# Patient Record
Sex: Female | Born: 1950 | Race: White
Health system: Southern US, Community
[De-identification: ages and names within clinical notes are randomized; demographics above are authoritative.]

## PROBLEM LIST (undated history)

## (undated) DIAGNOSIS — H409 Unspecified glaucoma: Secondary | ICD-10-CM

## (undated) DIAGNOSIS — I700 Atherosclerosis of aorta: Secondary | ICD-10-CM

## (undated) DIAGNOSIS — I119 Hypertensive heart disease without heart failure: Secondary | ICD-10-CM

## (undated) DIAGNOSIS — E669 Obesity, unspecified: Secondary | ICD-10-CM

## (undated) DIAGNOSIS — F32A Depression, unspecified: Secondary | ICD-10-CM

## (undated) DIAGNOSIS — G4730 Sleep apnea, unspecified: Secondary | ICD-10-CM

## (undated) DIAGNOSIS — I4891 Unspecified atrial fibrillation: Secondary | ICD-10-CM

## (undated) DIAGNOSIS — K5190 Ulcerative colitis, unspecified, without complications: Secondary | ICD-10-CM

## (undated) DIAGNOSIS — E039 Hypothyroidism, unspecified: Secondary | ICD-10-CM

## (undated) DIAGNOSIS — F329 Major depressive disorder, single episode, unspecified: Secondary | ICD-10-CM

## (undated) DIAGNOSIS — K219 Gastro-esophageal reflux disease without esophagitis: Secondary | ICD-10-CM

## (undated) DIAGNOSIS — I2699 Other pulmonary embolism without acute cor pulmonale: Secondary | ICD-10-CM

## (undated) DIAGNOSIS — E785 Hyperlipidemia, unspecified: Secondary | ICD-10-CM

## (undated) DIAGNOSIS — M3320 Polymyositis, organ involvement unspecified: Secondary | ICD-10-CM

## (undated) DIAGNOSIS — R011 Cardiac murmur, unspecified: Secondary | ICD-10-CM

## (undated) DIAGNOSIS — J45909 Unspecified asthma, uncomplicated: Secondary | ICD-10-CM

## (undated) DIAGNOSIS — J189 Pneumonia, unspecified organism: Secondary | ICD-10-CM

## (undated) DIAGNOSIS — I341 Nonrheumatic mitral (valve) prolapse: Secondary | ICD-10-CM

## (undated) DIAGNOSIS — K859 Acute pancreatitis without necrosis or infection, unspecified: Secondary | ICD-10-CM

## (undated) DIAGNOSIS — D649 Anemia, unspecified: Secondary | ICD-10-CM

## (undated) DIAGNOSIS — R002 Palpitations: Secondary | ICD-10-CM

## (undated) DIAGNOSIS — M47812 Spondylosis without myelopathy or radiculopathy, cervical region: Secondary | ICD-10-CM

## (undated) DIAGNOSIS — F419 Anxiety disorder, unspecified: Secondary | ICD-10-CM

## (undated) DIAGNOSIS — M353 Polymyalgia rheumatica: Secondary | ICD-10-CM

## (undated) DIAGNOSIS — M25519 Pain in unspecified shoulder: Secondary | ICD-10-CM

## (undated) DIAGNOSIS — R51 Headache: Secondary | ICD-10-CM

## (undated) DIAGNOSIS — D509 Iron deficiency anemia, unspecified: Secondary | ICD-10-CM

## (undated) DIAGNOSIS — Z9289 Personal history of other medical treatment: Secondary | ICD-10-CM

## (undated) DIAGNOSIS — I517 Cardiomegaly: Secondary | ICD-10-CM

## (undated) DIAGNOSIS — I340 Nonrheumatic mitral (valve) insufficiency: Secondary | ICD-10-CM

## (undated) DIAGNOSIS — M810 Age-related osteoporosis without current pathological fracture: Secondary | ICD-10-CM

## (undated) DIAGNOSIS — I499 Cardiac arrhythmia, unspecified: Secondary | ICD-10-CM

## (undated) DIAGNOSIS — R262 Difficulty in walking, not elsewhere classified: Secondary | ICD-10-CM

## (undated) DIAGNOSIS — S2232XA Fracture of one rib, left side, initial encounter for closed fracture: Secondary | ICD-10-CM

## (undated) HISTORY — DX: Difficulty in walking, not elsewhere classified: R26.2

## (undated) HISTORY — DX: Nonrheumatic mitral (valve) prolapse: I34.1

## (undated) HISTORY — DX: Gastro-esophageal reflux disease without esophagitis: K21.9

## (undated) HISTORY — DX: Cardiac arrhythmia, unspecified: I49.9

## (undated) HISTORY — PX: BREAST SURGERY: SHX581

## (undated) HISTORY — PX: FRACTURE SURGERY: SHX138

## (undated) HISTORY — PX: ROTATOR CUFF REPAIR: SHX139

## (undated) HISTORY — DX: Headache: R51

## (undated) HISTORY — DX: Personal history of other medical treatment: Z92.89

## (undated) HISTORY — DX: Cardiomegaly: I51.7

## (undated) HISTORY — DX: Sleep apnea, unspecified: G47.30

## (undated) HISTORY — DX: Nonrheumatic mitral (valve) insufficiency: I34.0

## (undated) HISTORY — PX: EGD, DILATION: SHX3804

## (undated) HISTORY — DX: Cardiac murmur, unspecified: R01.1

## (undated) HISTORY — PX: PANCREAS SURGERY: SHX731

## (undated) HISTORY — DX: Anemia, unspecified: D64.9

## (undated) HISTORY — DX: Unspecified asthma, uncomplicated: J45.909

## (undated) HISTORY — PX: ESOPHAGOGASTRODUODENOSCOPY: SHX1529

## (undated) HISTORY — DX: Hypothyroidism, unspecified: E03.9

## (undated) HISTORY — DX: Fracture of one rib, left side, initial encounter for closed fracture: S22.32XA

## (undated) HISTORY — DX: Polymyalgia rheumatica: M35.3

## (undated) HISTORY — DX: Ulcerative colitis, unspecified, without complications: K51.90

## (undated) HISTORY — PX: REDUCTION MAMMAPLASTY: SUR839

## (undated) HISTORY — PX: TOTAL SHOULDER REPLACEMENT: SUR1217

## (undated) HISTORY — PX: COLONOSCOPY WITH ESOPHAGOGASTRODUODENOSCOPY (EGD): SHX5779

## (undated) HISTORY — PX: ABDOMINAL SURGERY: SHX537

## (undated) HISTORY — PX: BREAST LUMPECTOMY: SHX2

## (undated) HISTORY — PX: BREAST REDUCTION SURGERY: SHX8

---
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
## 1970-10-14 HISTORY — PX: SHOULDER MUSCLE TRANSFER: SHX2406

## 1979-10-15 DIAGNOSIS — I2699 Other pulmonary embolism without acute cor pulmonale: Secondary | ICD-10-CM

## 1979-10-15 HISTORY — PX: ANKLE FUSION: SHX881

## 1979-10-15 HISTORY — PX: KNEE SURGERY: SHX244

## 1979-10-15 HISTORY — DX: Other pulmonary embolism without acute cor pulmonale: I26.99

## 1979-10-15 HISTORY — PX: KNEE ARTHROSCOPY: SUR90

## 1981-10-14 HISTORY — PX: COSMETIC SURGERY: SHX468

## 1988-10-14 DIAGNOSIS — K5190 Ulcerative colitis, unspecified, without complications: Secondary | ICD-10-CM

## 1988-10-14 HISTORY — DX: Ulcerative colitis, unspecified, without complications: K51.90

## 1996-10-14 HISTORY — PX: LUNG BIOPSY: SHX232

## 1997-10-08 ENCOUNTER — Emergency Department
Admit: 1997-10-08 | Disposition: A | Discharge status: Home / Self Care | Payer: Self-pay | Admitting: Emergency Medicine

## 1997-10-16 ENCOUNTER — Inpatient Hospital Stay: Admit: 1997-10-16 | Disposition: A | Discharge status: Home / Self Care | Payer: Self-pay | Admitting: Internal Medicine

## 1997-11-11 ENCOUNTER — Ambulatory Visit: Admit: 1997-11-11 | Disposition: A | Discharge status: Home / Self Care | Payer: Self-pay | Admitting: Pulmonary Disease

## 1997-11-14 ENCOUNTER — Ambulatory Visit: Admit: 1997-11-14 | Disposition: A | Discharge status: Home / Self Care | Payer: Self-pay | Admitting: Pulmonary Disease

## 1997-11-16 ENCOUNTER — Other Ambulatory Visit: Payer: Self-pay

## 1997-11-16 ENCOUNTER — Ambulatory Visit: Admit: 1997-11-16 | Disposition: A | Discharge status: Home / Self Care | Payer: Self-pay | Admitting: Pulmonary Disease

## 1997-11-22 ENCOUNTER — Ambulatory Visit: Admit: 1997-11-22 | Disposition: A | Discharge status: Home / Self Care | Payer: Self-pay | Admitting: Pulmonary Disease

## 1997-11-25 ENCOUNTER — Ambulatory Visit: Admit: 1997-11-25 | Disposition: A | Discharge status: Home / Self Care | Payer: Self-pay | Admitting: Pulmonary Disease

## 2004-04-09 ENCOUNTER — Emergency Department
Admission: EM | Admit: 2004-04-09 | Disposition: A | Discharge status: Home / Self Care | Payer: Self-pay | Source: Emergency Department | Admitting: Internal Medicine

## 2004-10-14 HISTORY — PX: GASTRIC BYPASS: SHX52

## 2005-06-24 ENCOUNTER — Ambulatory Visit
Admit: 2005-06-24 | Disposition: A | Discharge status: Home / Self Care | Payer: Self-pay | Source: Ambulatory Visit | Admitting: Surgery

## 2005-07-01 ENCOUNTER — Ambulatory Visit: Admission: RE | Admit: 2005-07-01 | Payer: Self-pay | Source: Ambulatory Visit | Admitting: Gastroenterology

## 2005-07-02 ENCOUNTER — Ambulatory Visit: Admission: RE | Admit: 2005-07-02 | Payer: Self-pay | Source: Ambulatory Visit | Admitting: Gastroenterology

## 2005-07-09 ENCOUNTER — Ambulatory Visit: Admit: 2005-07-09 | Disposition: A | Discharge status: Home / Self Care | Payer: Self-pay | Source: Ambulatory Visit

## 2005-07-19 ENCOUNTER — Ambulatory Visit: Admission: RE | Admit: 2005-07-19 | Payer: Self-pay | Source: Ambulatory Visit | Admitting: Specialist

## 2005-07-23 ENCOUNTER — Inpatient Hospital Stay
Admission: RE | Admit: 2005-07-23 | Disposition: A | Discharge status: Home / Self Care | Payer: Self-pay | Source: Ambulatory Visit | Admitting: Surgery

## 2005-08-19 ENCOUNTER — Ambulatory Visit: Admission: RE | Admit: 2005-08-19 | Payer: Self-pay | Source: Ambulatory Visit | Admitting: Specialist

## 2005-08-23 ENCOUNTER — Ambulatory Visit: Admission: RE | Admit: 2005-08-23 | Payer: Self-pay | Source: Ambulatory Visit | Admitting: Gastroenterology

## 2005-09-13 ENCOUNTER — Ambulatory Visit: Admission: RE | Admit: 2005-09-13 | Payer: Self-pay | Source: Ambulatory Visit | Admitting: Gastroenterology

## 2005-09-26 ENCOUNTER — Ambulatory Visit
Admit: 2005-09-26 | Disposition: A | Discharge status: Home / Self Care | Payer: Self-pay | Source: Ambulatory Visit | Admitting: Surgery

## 2005-09-30 ENCOUNTER — Ambulatory Visit
Admit: 2005-09-30 | Disposition: A | Discharge status: Home / Self Care | Payer: Self-pay | Source: Ambulatory Visit | Admitting: Gastroenterology

## 2006-02-23 ENCOUNTER — Emergency Department: Payer: Self-pay | Admitting: Emergency Medicine

## 2006-03-20 ENCOUNTER — Ambulatory Visit
Admit: 2006-03-20 | Disposition: A | Discharge status: Home / Self Care | Payer: Self-pay | Source: Ambulatory Visit | Admitting: Surgery

## 2006-03-20 LAB — CBC- CERNER
Hematocrit: 38.6 % (ref 37.0–47.0)
Hgb: 12.6 G/DL (ref 12.0–16.0)
MCH: 29.1 PG (ref 28.0–32.0)
MCHC: 32.6 G/DL (ref 32.0–36.0)
MCV: 89.3 FL (ref 80.0–100.0)
MPV: 9.6 FL (ref 7.4–10.4)
Platelets: 217 /mm3 (ref 140–400)
RBC: 4.33 /mm3 (ref 4.20–5.40)
RDW: 14 % (ref 11.5–15.0)
WBC: 8.2 /mm3 (ref 3.5–10.8)

## 2006-03-20 LAB — COMPREHENSIVE METABOLIC PANEL
ALT: 30 U/L (ref 7–56)
AST (SGOT): 20 U/L (ref 5–40)
Albumin/Globulin Ratio: 1.2 (ref 1.1–1.8)
Albumin: 3.4 G/DL — ABNORMAL LOW (ref 3.7–5.1)
Alkaline Phosphatase: 86 U/L (ref 43–122)
BUN: 16 MG/DL (ref 7–21)
Bilirubin, Total: 0.4 MG/DL (ref 0.2–1.3)
CO2: 27 MEQ/L (ref 22–31)
Calcium: 8.9 MG/DL (ref 8.6–10.2)
Chloride: 106 MEQ/L (ref 98–107)
Creatinine: 0.8 MG/DL (ref 0.5–1.4)
Globulin: 2.8 G/DL (ref 2.0–3.7)
Glucose: 97 MG/DL (ref 65–110)
Potassium: 3.6 MEQ/L (ref 3.6–5.0)
Protein, Total: 6.2 G/DL (ref 6.0–8.0)
Sodium: 141 MEQ/L (ref 136–143)

## 2006-03-20 LAB — GFR

## 2006-03-20 LAB — VITAMIN B12: Vitamin B-12: 774 pg/mL (ref 225.0–950.0)

## 2006-03-20 LAB — LIPID PANEL
Cholesterol: 191 MG/DL (ref ?–200)
HDL: 44 mg/dL (ref 40–60)
LDL Calculated: 127 mg/dL — ABNORMAL HIGH (ref ?–100)
Triglycerides: 101 MG/DL (ref ?–150)
VLDL Calculated: 20 mg/dL (ref ?–30)

## 2006-03-20 LAB — FERRITIN: Ferritin: 99.7 ng/mL (ref 11.0–264.0)

## 2006-03-20 LAB — IRON: Iron: 42 UG/DL (ref 37–171)

## 2006-03-20 LAB — PTH, INTACT: PTH Intact: 56.2 pg/ml (ref 15.0–65.0)

## 2006-03-20 LAB — FOLATE: Folate: >20 ng/mL (ref 2.8–20.0)

## 2006-03-21 LAB — GLYCOHEMOGLOBIN A1C* CERNER: % Hgb A1C: 5.8 % (ref 4.8–6.0)

## 2006-03-22 LAB — 25-HYDROXYVITAMIN D2 AND D3, S-SOFT

## 2006-03-24 LAB — TRANSFERRIN: Transferrin: 206 mg/dL (ref 150–330)

## 2006-09-20 ENCOUNTER — Emergency Department
Admission: EM | Admit: 2006-09-20 | Disposition: A | Discharge status: Home / Self Care | Payer: Self-pay | Source: Emergency Department | Admitting: Emergency Medicine

## 2006-09-21 LAB — ^CBC WITH DIFF MCKESSON
BASOPHILS %: 0.4 % (ref 0–2)
Baso(Absolute): 0.1
Eosinophils %: 0.5 % (ref 0–6)
Eosinophils Absolute: 0.1
Hematocrit: 42 % (ref 27.0–49.5)
Hemoglobin: 13.8 g/dL (ref 11.7–15.5)
Lymphocytes Absolute: 2.5
Lymphocytes Relative: 18.6 % — ABNORMAL LOW (ref 25–55)
MCH: 30.2 pg (ref 27.0–34.0)
MCHC: 32.9 % (ref 32.0–36.0)
MCV: 91.8 fL (ref 80–100)
Monocytes Absolute: 0.6
Monocytes Relative %: 4.2 % (ref 1–8)
Neutrophils Absolute: 10.1
Neutrophils Relative %: 76.3 % — ABNORMAL HIGH (ref 49–69)
Platelets: 335 10*3/uL (ref 150–400)
RBC: 4.58 /mm3 (ref 3.80–5.40)
RDW: 14.3 % — ABNORMAL HIGH (ref 11.0–14.0)
WBC: 13.3 10*3/uL — ABNORMAL HIGH (ref 4.8–10.8)

## 2006-09-21 LAB — COMPREHENSIVE METABOLIC PANEL
ALT: 26 U/L (ref 7–56)
AST (SGOT): 22 U/L (ref 5–40)
Albumin, Synovial: 4.4 g/dL (ref 3.9–5.0)
Alkaline Phosphatase: 109 U/L (ref 38–126)
BUN / Creatinine Ratio: 19 (ref 8–20)
BUN: 19 mg/dL (ref 6–20)
Bilirubin, Total: 0.4 mg/dL (ref 0.2–1.3)
CO2: 26 mmol/L (ref 21.0–31.0)
Calcium: 9.3 mg/dL (ref 8.4–10.2)
Chloride: 104 mmol/L (ref 101–111)
Creatinine: 0.96 mg/dL (ref 0.5–1.4)
EGFR: >60 mL/min/{1.73_m2}
EGFR: >60 mL/min/{1.73_m2}
Glucose: 115 mg/dL — ABNORMAL HIGH (ref 70–100)
Potassium: 4.3 mmol/L (ref 3.6–5.0)
Protein, Total: 7.8 g/dL (ref 6.3–8.2)
Sodium: 141 mmol/L (ref 135–145)

## 2006-09-21 LAB — AMYLASE: Amylase: <30 U/L (ref 29–110)

## 2006-09-21 LAB — LIPASE: Lipase: 28 U/L (ref 23–300)

## 2006-10-14 DIAGNOSIS — I4891 Unspecified atrial fibrillation: Secondary | ICD-10-CM

## 2006-10-14 DIAGNOSIS — I480 Paroxysmal atrial fibrillation: Secondary | ICD-10-CM

## 2006-10-14 HISTORY — DX: Unspecified atrial fibrillation: I48.91

## 2006-10-14 HISTORY — DX: Paroxysmal atrial fibrillation: I48.0

## 2007-01-30 ENCOUNTER — Ambulatory Visit
Admit: 2007-01-30 | Disposition: A | Discharge status: Home / Self Care | Payer: Self-pay | Source: Ambulatory Visit | Admitting: Gastroenterology

## 2007-04-28 ENCOUNTER — Ambulatory Visit
Admission: RE | Admit: 2007-04-28 | Disposition: A | Discharge status: Home / Self Care | Payer: Self-pay | Source: Ambulatory Visit | Admitting: Cardiovascular Disease

## 2007-10-15 HISTORY — PX: CARDIAC CATHETERIZATION: SHX172

## 2008-04-21 ENCOUNTER — Ambulatory Visit
Admission: RE | Admit: 2008-04-21 | Disposition: A | Discharge status: Home / Self Care | Payer: Self-pay | Source: Ambulatory Visit | Admitting: Surgery

## 2008-04-29 ENCOUNTER — Ambulatory Visit
Admission: RE | Admit: 2008-04-29 | Disposition: A | Discharge status: Home / Self Care | Payer: Self-pay | Source: Ambulatory Visit | Admitting: Surgery

## 2008-05-27 ENCOUNTER — Ambulatory Visit
Admission: RE | Admit: 2008-05-27 | Disposition: A | Discharge status: Home / Self Care | Payer: Self-pay | Source: Ambulatory Visit | Admitting: Surgery

## 2008-10-18 ENCOUNTER — Ambulatory Visit
Admit: 2008-10-18 | Disposition: A | Discharge status: Home / Self Care | Payer: Self-pay | Source: Ambulatory Visit | Admitting: Gastroenterology

## 2008-10-18 LAB — HEPATIC FUNCTION PANEL
ALT: 19 U/L (ref 7–56)
AST (SGOT): 20 U/L (ref 5–40)
Albumin/Globulin Ratio: 1.4 (ref 1.1–1.8)
Albumin: 3.9 G/DL (ref 3.7–5.1)
Alkaline Phosphatase: 120 U/L (ref 43–122)
Bilirubin Direct: 0 MG/DL — AB (ref 0.0–0.3)
Bilirubin Indirect: 0.2 MG/DL (ref 0.0–1.1)
Bilirubin, Total: 0.2 MG/DL (ref 0.2–1.3)
Globulin: 2.8 G/DL (ref 2.0–3.7)
Protein, Total: 6.7 G/DL (ref 6.0–8.0)

## 2008-10-18 LAB — LIPASE: Lipase: 95 U/L (ref 23–300)

## 2008-10-18 LAB — AMYLASE: Amylase: 30 U/L — AB (ref 30–110)

## 2008-10-18 LAB — SEDIMENTATION RATE: Sed Rate: 27 MM/HR — ABNORMAL HIGH (ref 0–23)

## 2008-11-11 ENCOUNTER — Ambulatory Visit: Admission: RE | Admit: 2008-11-11 | Payer: Self-pay | Source: Ambulatory Visit | Admitting: Gastroenterology

## 2008-11-18 ENCOUNTER — Ambulatory Visit: Admission: RE | Admit: 2008-11-18 | Payer: Self-pay | Source: Ambulatory Visit | Admitting: Gastroenterology

## 2009-05-17 ENCOUNTER — Ambulatory Visit
Admission: RE | Admit: 2009-05-17 | Disposition: A | Discharge status: Home / Self Care | Payer: Self-pay | Source: Ambulatory Visit | Admitting: Surgery

## 2009-10-14 HISTORY — PX: D&C DIAGNOSTIC: SHX510210

## 2009-10-14 HISTORY — PX: DILATION AND CURETTAGE OF UTERUS: SHX78

## 2009-10-14 HISTORY — PX: DILATION AND CURETTAGE, DIAGNOSTIC / THERAPEUTIC: SUR384

## 2010-08-14 HISTORY — PX: CYSTOSTOMY W/ BLADDER BIOPSY: SHX1431

## 2010-10-14 DIAGNOSIS — K859 Acute pancreatitis without necrosis or infection, unspecified: Secondary | ICD-10-CM

## 2010-10-14 DIAGNOSIS — H409 Unspecified glaucoma: Secondary | ICD-10-CM

## 2010-10-14 DIAGNOSIS — Z5189 Encounter for other specified aftercare: Secondary | ICD-10-CM

## 2010-10-14 HISTORY — DX: Acute pancreatitis without necrosis or infection, unspecified: K85.90

## 2010-10-14 HISTORY — DX: Encounter for other specified aftercare: Z51.89

## 2010-10-14 HISTORY — PX: JOINT REPLACEMENT: SHX530

## 2010-10-14 HISTORY — DX: Unspecified glaucoma: H40.9

## 2010-10-14 HISTORY — PX: TOTAL KNEE ARTHROPLASTY: SHX125

## 2011-02-22 ENCOUNTER — Emergency Department: Payer: Self-pay | Admitting: Emergency Medicine

## 2011-08-01 NOTE — Op Note (Unsigned)
DATE OF BIRTH:                        04-22-1951      ADMISSION DATE:                     04/28/2007            PATIENT LOCATION:                     [facility]            DATE OF PROCEDURE:                   04/28/2007      SURGEON:                            Mardene Celeste, MD      ASSISTANT(S):                  PREOPERATIVE DIAGNOSIS:            POSTOPERATIVE DIAGNOSIS:            PROCEDURE:            INDICATIONS FOR PROCEDURE:   The patient is a pleasant 60 year old female      who has experienced, since bariatric surgery one and a half years ago, a      progressive decrement in LV systolic function.  A TEE performed several      weeks ago revealed an ejection fraction of approximately 35% with mild to      moderate mitral regurgitation and the presence of a small PFO with right to      left shunting.  To exclude coronary disease and reassess the severity of      the mitral regurgitation, a cardiac catheterization was recommended.            DESCRIPTION OF PROCEDURE:  The patient was premedicated and brought to the      cardiac catheterization laboratory.  She did have an iodine allergy and      was, therefore, truly premedicated with steroids and antihistamines.   The      right inguinal area was prepped and draped.  Utilizing aseptic technique      and a single needle puncture approach, a 6-French sheath was placed into      the right femoral artery.  A Judkins 4L 6-French left coronary arterial      catheter was introduced over a guide wire and passed proximally to engage      the left main for definitive arteriography of this system.  This catheter      was removed, and 6-French right Williams catheter was then introduced in a      similar fashion, which was utilized to cannulate the right main, with      definitive arteriography of this system performed.  This catheter was then      removed, and a pigtail catheter was passed into the left ventricle for      determination of the left ventricular end  diastolic pressure.  RAO      ventriculography was then performed.  The catheter withdrawn across the      aortic valve to exclude the possibility of a transvalvular gradient and      removed from the patient.  Hemostasis was obtained with the Angio-Seal  device.  Optiray was used throughout.   There were no complications.            HEMODYNAMICS:      1.   The left ventricular end diastolic pressure was normal at rest.      2.   There was no transaortic gradient.            ARTERIOGRAPHY:  The right coronary artery was non dominant.  It was a      small, non dominant vessel coursing within the AV groove, free os      significant obstructive disease.            Left main.  This vessel was of normal length and dimensions, and free of      significant obstructive disease.   It gives rise to a dominant circumflex      and left anterior descending.  The circumflex consists of a large AV groove      branch and first branching obtuse marginal, as well as a distal posterior      descending branch and posteroventricular arcade.  This system is free of      significant obstructive disease.            Left anterior descending. This is a large vessel coursing down the      intraventricular groove toward the apex.  It and its ramifications,      including the large first diagonal, are free of significant obstructive      disease.            RAO ventriculography revealed a left ventricle of normal volumes, with      evidence of diffuse mild hypokinesis.  There was no evidence of mitral      prolapse nor of mitral regurgitation seen.  The ejection fraction is      estimated at 40%.            FINAL IMPRESSION:      1.   Normal coronary arteries.      2.   Global diminishment of left ventricular systolic function, ejection      fraction of 40%.      3.   Normal left ventricular diastolic function.      4.   No evidence of mitral regurgitation seen.                                          ___________________________________           Date Signed: __________      Mardene Celeste, MD  ([id number])            D: 04/28/2007 by Mardene Celeste, MD      T: 04/28/2007 by [id number] ([id number]) ([id number])      cc:  Mardene Celeste, MD

## 2011-10-01 NOTE — Op Note (Signed)
MRN: [id number] DOCUMENT ID: [id number]      INTRODUCTION:      60 YEAR OLD FEMALE PATIENT PRESENTS FOR AN ELECTIVE OUTPATIENT EGD.  THE      INDICATION FOR THE PROCEDURE WAS GERD SYMPTOMS.      CONSENT:      THE BENEFITS, RISKS, AND ALTERNATIVES TO THE PROCEDURE WERE DISCUSSED AND      INFORMED CONSENT WAS OBTAINED.      PREPARATION:      EKG, PULSE, PULSE OXIMETRY AND BLOOD PRESSURE MONITORED.      MEDICATIONS:      - MAC ANESTHESIA WAS ADMINISTERED DURING THE PROCEDURE      PROCEDURE:      THE ENDOSCOPE WAS PASSED.  THE STUDY WAS PERFORMED WITH A GIF-H180 AND      GIF-H180.      FINDINGS:  THE SCOPE WAS ADVANCED TO THE GASTRIC POUCH.  THE ANASTOMOSIS      WAS PATENT.  NO ULCERS NOTED.  THE SMALL BOWEL LIMBS WERE PATENT.  THE      SCOPE WAS WITHDRAWN BACK.  IN THE DISTAL ESOPHAGUS, ISLANDS OF SALMON      COLORED MUCOSA WERE BIOPSIED FOR BARRETT'S ESOPHAGUS.      IMPRESSION:      1.  MILD GASTRITIS-BIOPSIED FOR H. PYLORI      2.  RULE OUT BARRETT'S ESOPHAGUS      3.  PATENT ANASTOMOSIS      RECOMMENDATION:      -WILL CHECK BIOPSY RESULTS      -CONTINUE CURRENT MEDICATIONS      -WILL SEND LETTER FOR RESULTS IN 3-4 WEEKS      SIGNING PHYSICIAN: Nechelle Petrizzo

## 2011-10-01 NOTE — Op Note (Signed)
MRN: [id number] DOCUMENT ID: [id number]      INTRODUCTION:      60 YEAR OLD FEMALE PATIENT PRESENTS FOR AN ELECTIVE OUTPATIENT      COLONOSCOPY.  THE INDICATION FOR THE PROCEDURE WAS HISTORY OF ULCERATIVE      COLITIS AND ABDOMINAL PAIN.      CONSENT:      THE BENEFITS, RISKS, AND ALTERNATIVES TO THE PROCEDURE WERE DISCUSSED AND      INFORMED CONSENT WAS OBTAINED.      PREPARATION:      EKG, PULSE, PULSE OXIMETRY AND BLOOD PRESSURE MONITORED.      MEDICATIONS:      - MAC ANESTHESIA WAS ADMINISTERED DURING THE PROCEDURE      PROCEDURE:      RECTAL EXAM: NORMAL.      THE ENDOSCOPE WAS PASSED.  THE QUALITY OF THE PREPARATION WAS GOOD.  THE      STUDY WAS PERFORMED WITH A CF-H180.      FINDINGS:  THE SCOPE WAS ADVANCED TO THE CECUM.  THE IC VALVE AND      APPENDICEAL ORIFICE WERE NOTED.  THE SCOPE WAS ADVANCED INTO THE TERMINAL      ILEUM WHICH WAS NORMAL.  THE SCOPE WAS WITHDRAWN BACK.  THE SCOPE WAS      WITHDRAWN BACK.  THERE WAS MILD ERYTHEMA OF THE ASCENDING, TRANSVERSE, AND      DESCENDING COLON.  EACH SEGMENT WAS BIOPSIED FOR COLITIS AND DYSPLASIA.      THE SIGMOID AND RECTUM APPEARED UNREMARKABLE.  SIGMOID DIVERTICULOSIS WERE      NOTED.  BIOPSIES WERE PERFORMED FOR COLITIS AND DYSPLASIA.  RETROFLEXION      SHOWED INTERNAL HEMORRHOIDS.      IMPRESSION:      1.  QUIESCENT COLITIS.  EACH SEGMENT WAS BIOPSIED FOR COLITIS AND      DYSPLASIA.      2.  SIGMOID DIVERTICULOSIS      3.  INTERNAL HEMORRHOIDS      4.  NORMAL TERMINAL ILEUM.      RECOMMENDATION:      -WILL CHECK BIOPSY RESULTS      -CONTINUE CURRENT MEDICATIONS      -WILL SEND LETTER FOR RESULTS IN 3-4 WEEKS      SIGNING PHYSICIAN: Hicks Feick

## 2011-10-15 HISTORY — PX: CHOLECYSTECTOMY: SHX55

## 2012-02-14 ENCOUNTER — Ambulatory Visit: Payer: Medicare Other | Attending: Surgery

## 2012-02-14 NOTE — Pre-Procedure Instructions (Signed)
Hx of anemia requiring transfusions. No pre-ops ordered from MD.  [facility] faxed for recent labwork.  Orders faxed to pharmacy.

## 2012-02-17 NOTE — Anesthesia Preprocedure Evaluation (Addendum)
Anesthesia Evaluation    AIRWAY    Mallampati: I    TM distance: >3 FB  Neck ROM: full  Mouth Opening:full   CARDIOVASCULAR    regular and normal     DENTAL         PULMONARY    clear to auscultation     OTHER FINDINGS    Upper front caps; upper/lower crowns          Anesthesia Plan    ASA 3   general   Detailed anesthesia plan: general endotracheal    Post Op: other  Post op pain management: per surgeon  intravenous induction       Plan discussed with CRNA.

## 2012-02-17 NOTE — Pre-Procedure Instructions (Signed)
Previous ekg's /TSH, Vit D  12/2011 sent for scan

## 2012-02-18 ENCOUNTER — Ambulatory Visit: Payer: Self-pay

## 2012-02-18 ENCOUNTER — Inpatient Hospital Stay: Payer: Medicare Other

## 2012-02-18 ENCOUNTER — Ambulatory Visit
Admission: RE | Admit: 2012-02-18 | Discharge: 2012-02-18 | Disposition: A | Discharge status: Home / Self Care | Payer: Medicare Other | Source: Ambulatory Visit | Attending: Surgery | Admitting: Surgery

## 2012-02-18 ENCOUNTER — Encounter: Payer: Self-pay | Admitting: Pain Medicine

## 2012-02-18 ENCOUNTER — Inpatient Hospital Stay: Payer: Medicare Other | Admitting: Surgery

## 2012-02-18 ENCOUNTER — Encounter
Admission: RE | Disposition: A | Discharge status: Home / Self Care | Payer: Self-pay | Source: Ambulatory Visit | Attending: Surgery

## 2012-02-18 ENCOUNTER — Inpatient Hospital Stay: Payer: Medicare Other | Admitting: Pain Medicine

## 2012-02-18 DIAGNOSIS — I059 Rheumatic mitral valve disease, unspecified: Secondary | ICD-10-CM | POA: Insufficient documentation

## 2012-02-18 DIAGNOSIS — K859 Acute pancreatitis without necrosis or infection, unspecified: Secondary | ICD-10-CM | POA: Insufficient documentation

## 2012-02-18 DIAGNOSIS — K458 Other specified abdominal hernia without obstruction or gangrene: Secondary | ICD-10-CM | POA: Insufficient documentation

## 2012-02-18 DIAGNOSIS — K219 Gastro-esophageal reflux disease without esophagitis: Secondary | ICD-10-CM | POA: Insufficient documentation

## 2012-02-18 DIAGNOSIS — Z86718 Personal history of other venous thrombosis and embolism: Secondary | ICD-10-CM | POA: Insufficient documentation

## 2012-02-18 DIAGNOSIS — E039 Hypothyroidism, unspecified: Secondary | ICD-10-CM | POA: Insufficient documentation

## 2012-02-18 DIAGNOSIS — Z9884 Bariatric surgery status: Secondary | ICD-10-CM | POA: Insufficient documentation

## 2012-02-18 DIAGNOSIS — R1011 Right upper quadrant pain: Secondary | ICD-10-CM | POA: Insufficient documentation

## 2012-02-18 HISTORY — DX: Other pulmonary embolism without acute cor pulmonale: I26.99

## 2012-02-18 LAB — URINE HCG QUALITATIVE: Urine HCG Qualitative: NEGATIVE

## 2012-02-18 SURGERY — LAPAROSCOPIC, CHOLECYSTECTOMY, CHOLANGIOGRAM
Anesthesia: Anesthesia General | Site: Pelvis | Wound class: Clean Contaminated

## 2012-02-18 MED ORDER — OXYCODONE-ACETAMINOPHEN 5-325 MG PO TABS
1.00 | ORAL_TABLET | Freq: Four times a day (QID) | ORAL | Status: AC | PRN
Start: 2012-02-18 — End: 2012-02-28

## 2012-02-18 MED ORDER — PROPOFOL 10 MG/ML IV EMUL
INTRAVENOUS | Status: DC | PRN
Start: 2012-02-18 — End: 2012-02-18
  Administered 2012-02-18: 150 mg via INTRAVENOUS

## 2012-02-18 MED ORDER — FENTANYL CITRATE 0.05 MG/ML IJ SOLN
50.0000 ug | INTRAMUSCULAR | Status: DC | PRN
Start: 2012-02-18 — End: 2012-02-18

## 2012-02-18 MED ORDER — OXYCODONE-ACETAMINOPHEN 5-325 MG PO TABS
ORAL_TABLET | ORAL | Status: AC
Start: 2012-02-18 — End: ?
  Filled 2012-02-18: qty 1

## 2012-02-18 MED ORDER — LACTATED RINGERS IV SOLN
INTRAVENOUS | Status: DC
Start: 2012-02-18 — End: 2012-02-18
  Administered 2012-02-18: 1000 mL via INTRAVENOUS

## 2012-02-18 MED ORDER — OXYCODONE-ACETAMINOPHEN 5-325 MG PO TABS
1.0000 | ORAL_TABLET | Freq: Once | ORAL | Status: AC | PRN
Start: 2012-02-18 — End: 2012-02-18
  Administered 2012-02-18: 1 via ORAL

## 2012-02-18 MED ORDER — OXYCODONE-ACETAMINOPHEN 5-325 MG PO TABS
1.0000 | ORAL_TABLET | Freq: Once | ORAL | Status: DC | PRN
Start: 2012-02-18 — End: 2012-02-18

## 2012-02-18 MED ORDER — SODIUM CHLORIDE 0.9 % IR SOLN
Status: DC | PRN
Start: 2012-02-18 — End: 2012-02-18
  Administered 2012-02-18: 100 mL

## 2012-02-18 MED ORDER — IOTHALAMATE MEGLUMINE 60 % IJ SOLN
INTRAMUSCULAR | Status: DC | PRN
Start: 2012-02-18 — End: 2012-02-18
  Administered 2012-02-18: 28 mL

## 2012-02-18 MED ORDER — HYDROMORPHONE HCL PF 1 MG/ML IJ SOLN
0.5000 mg | INTRAMUSCULAR | Status: DC | PRN
Start: 2012-02-18 — End: 2012-02-18

## 2012-02-18 MED ORDER — KETOROLAC TROMETHAMINE 30 MG/ML IJ SOLN
INTRAMUSCULAR | Status: AC
Start: 2012-02-18 — End: ?
  Filled 2012-02-18: qty 1

## 2012-02-18 MED ORDER — LIDOCAINE HCL 2 % IJ SOLN
INTRAMUSCULAR | Status: AC
Start: 2012-02-18 — End: ?
  Filled 2012-02-18: qty 1

## 2012-02-18 MED ORDER — MEPERIDINE HCL 25 MG/ML IJ SOLN
25.0000 mg | Freq: Once | INTRAMUSCULAR | Status: DC | PRN
Start: 2012-02-18 — End: 2012-02-18

## 2012-02-18 MED ORDER — NEOSTIGMINE METHYLSULFATE 1 MG/ML IJ SOLN
INTRAMUSCULAR | Status: DC | PRN
Start: 2012-02-18 — End: 2012-02-18
  Administered 2012-02-18: 3 mg via INTRAVENOUS

## 2012-02-18 MED ORDER — HYDROMORPHONE HCL PF 1 MG/ML IJ SOLN
INTRAMUSCULAR | Status: AC
Start: 2012-02-18 — End: 2012-02-18
  Administered 2012-02-18: 0.5 mg via INTRAVENOUS
  Filled 2012-02-18: qty 1

## 2012-02-18 MED ORDER — BUPIVACAINE-EPINEPHRINE (PF) 0.5% -1:200000 IJ SOLN
INTRAMUSCULAR | Status: AC
Start: 2012-02-18 — End: ?
  Filled 2012-02-18: qty 1

## 2012-02-18 MED ORDER — PROMETHAZINE HCL 25 MG/ML IJ SOLN
6.2500 mg | Freq: Once | INTRAMUSCULAR | Status: DC | PRN
Start: 2012-02-18 — End: 2012-02-18

## 2012-02-18 MED ORDER — ROCURONIUM BROMIDE 50 MG/5ML IV SOLN
INTRAVENOUS | Status: AC
Start: 2012-02-18 — End: ?
  Filled 2012-02-18: qty 1

## 2012-02-18 MED ORDER — ONDANSETRON HCL 4 MG/2ML IJ SOLN
4.0000 mg | Freq: Once | INTRAMUSCULAR | Status: DC | PRN
Start: 2012-02-18 — End: 2012-02-18

## 2012-02-18 MED ORDER — ONDANSETRON HCL 4 MG/2ML IJ SOLN
INTRAMUSCULAR | Status: DC
Start: 2012-02-18 — End: 2012-02-18
  Filled 2012-02-18: qty 2

## 2012-02-18 MED ORDER — FENTANYL CITRATE 0.05 MG/ML IJ SOLN
INTRAMUSCULAR | Status: DC | PRN
Start: 2012-02-18 — End: 2012-02-18
  Administered 2012-02-18: 100 ug via INTRAVENOUS
  Administered 2012-02-18 (×2): 50 ug via INTRAVENOUS

## 2012-02-18 MED ORDER — SUCCINYLCHOLINE CHLORIDE 20 MG/ML IJ SOLN
INTRAMUSCULAR | Status: DC | PRN
Start: 2012-02-18 — End: 2012-02-18
  Administered 2012-02-18: 100 mg via INTRAVENOUS

## 2012-02-18 MED ORDER — GLYCOPYRROLATE 0.2 MG/ML IJ SOLN
INTRAMUSCULAR | Status: DC | PRN
Start: 2012-02-18 — End: 2012-02-18
  Administered 2012-02-18: .4 mg via INTRAVENOUS

## 2012-02-18 MED ORDER — FENTANYL CITRATE 0.05 MG/ML IJ SOLN
INTRAMUSCULAR | Status: AC
Start: 2012-02-18 — End: ?
  Filled 2012-02-18: qty 2

## 2012-02-18 MED ORDER — ROCURONIUM BROMIDE 50 MG/5ML IV SOLN
INTRAVENOUS | Status: DC | PRN
Start: 2012-02-18 — End: 2012-02-18
  Administered 2012-02-18: 5 mg via INTRAVENOUS
  Administered 2012-02-18: 25 mg via INTRAVENOUS

## 2012-02-18 MED ORDER — SODIUM CHLORIDE 0.9 % IV MBP
1000.0000 mg | INTRAVENOUS | Status: DC
Start: 2012-02-18 — End: 2012-02-18
  Administered 2012-02-18: 1 g via INTRAVENOUS

## 2012-02-18 MED ORDER — ERTAPENEM SODIUM 1 G IJ SOLR
1000.00 mg | INTRAMUSCULAR | Status: DC
Start: 2012-02-18 — End: 2012-02-18

## 2012-02-18 MED ORDER — MEPERIDINE HCL 25 MG/ML IJ SOLN
12.5000 mg | INTRAMUSCULAR | Status: DC | PRN
Start: 2012-02-18 — End: 2012-02-18

## 2012-02-18 MED ORDER — KETOROLAC TROMETHAMINE 30 MG/ML IJ SOLN
INTRAMUSCULAR | Status: DC | PRN
Start: 2012-02-18 — End: 2012-02-18
  Administered 2012-02-18: 15 mg via INTRAVENOUS

## 2012-02-18 MED ORDER — PROPOFOL 10 MG/ML IV EMUL
INTRAVENOUS | Status: AC
Start: 2012-02-18 — End: ?
  Filled 2012-02-18: qty 1

## 2012-02-18 MED ORDER — MIDAZOLAM HCL 2 MG/2ML IJ SOLN
INTRAMUSCULAR | Status: DC | PRN
Start: 2012-02-18 — End: 2012-02-18
  Administered 2012-02-18: 2 mg via INTRAVENOUS

## 2012-02-18 MED ORDER — NEOSTIGMINE METHYLSULFATE 1 MG/ML IJ SOLN
INTRAMUSCULAR | Status: AC
Start: 2012-02-18 — End: ?
  Filled 2012-02-18: qty 10

## 2012-02-18 MED ORDER — BUPIVACAINE-EPINEPHRINE (PF) 0.5% -1:200000 IJ SOLN
INTRAMUSCULAR | Status: DC | PRN
Start: 2012-02-18 — End: 2012-02-18
  Administered 2012-02-18: 6 mL via INTRAMUSCULAR

## 2012-02-18 MED ORDER — ONDANSETRON HCL 4 MG/2ML IJ SOLN
4.0000 mg | Freq: Once | INTRAMUSCULAR | Status: AC | PRN
Start: 2012-02-18 — End: 2012-02-18
  Administered 2012-02-18: 4 mg via INTRAVENOUS

## 2012-02-18 MED ORDER — ERTAPENEM SODIUM 1 G IJ SOLR
INTRAMUSCULAR | Status: AC
Start: 2012-02-18 — End: ?
  Filled 2012-02-18: qty 1000

## 2012-02-18 MED ORDER — LACTATED RINGERS IV SOLN
INTRAVENOUS | Status: DC | PRN
Start: 2012-02-18 — End: 2012-02-18

## 2012-02-18 MED ORDER — SODIUM CHLORIDE 0.9 % IV SOLN
INTRAVENOUS | Status: DC | PRN
Start: 2012-02-18 — End: 2012-02-18

## 2012-02-18 MED ORDER — FENTANYL CITRATE 0.05 MG/ML IJ SOLN
50.0000 ug | INTRAMUSCULAR | Status: DC | PRN
Start: 2012-02-18 — End: 2012-02-18
  Administered 2012-02-18: 50 ug via INTRAVENOUS

## 2012-02-18 MED ORDER — BACITRACIN 50000 UNITS IM SOLR
INTRAMUSCULAR | Status: AC
Start: 2012-02-18 — End: ?
  Filled 2012-02-18: qty 50000

## 2012-02-18 MED ORDER — FENTANYL CITRATE 0.05 MG/ML IJ SOLN
INTRAMUSCULAR | Status: AC
Start: 2012-02-18 — End: 2012-02-18
  Administered 2012-02-18: 50 ug via INTRAVENOUS
  Filled 2012-02-18: qty 2

## 2012-02-18 MED ORDER — SUCCINYLCHOLINE CHLORIDE 20 MG/ML IJ SOLN
INTRAMUSCULAR | Status: AC
Start: 2012-02-18 — End: ?
  Filled 2012-02-18: qty 1

## 2012-02-18 MED ORDER — MIDAZOLAM HCL 2 MG/2ML IJ SOLN
INTRAMUSCULAR | Status: AC
Start: 2012-02-18 — End: ?
  Filled 2012-02-18: qty 1

## 2012-02-18 MED ORDER — ONDANSETRON HCL 4 MG/2ML IJ SOLN
INTRAMUSCULAR | Status: DC | PRN
Start: 2012-02-18 — End: 2012-02-18
  Administered 2012-02-18: 4 mg via INTRAVENOUS

## 2012-02-18 MED ORDER — LABETALOL HCL 5 MG/ML IV SOLN
INTRAVENOUS | Status: AC
Start: 2012-02-18 — End: ?
  Filled 2012-02-18: qty 1

## 2012-02-18 SURGICAL SUPPLY — 65 items
ADHESIVE SKIN SURGISEAL .35ML (Suture) ×3 IMPLANT
APPLCATOR CHLORAPREP 26ML (Prep) ×3 IMPLANT
BAG ENDO POUCH (Procedure Accessories) ×2 IMPLANT
CABLE HIGH FREQUENCY MONOPOLAR (Cautery) ×3 IMPLANT
CATH CHOLANGIOGRAM 4.5X18IN (Procedure Accessories) ×1 IMPLANT
CLIP ENDO 10 MM (Laparoscopy Supplies) ×3 IMPLANT
CUTTER ENDO LINEAR ECHELON 60 (Staplers) ×2 IMPLANT
DEVICE SUT E STCH 10MM 15IN LF STRL (Laparoscopy Supplies) ×3
DEVICE SUTURING L15 IN PUSH BUTTON GRIP (Laparoscopy Supplies) ×2 IMPLANT
DEVICE SUTURING L15 IN PUSH BUTTON GRP HNDL 10MM ENDO STTCH ENDOSCOPIC (Laparoscopy Supplies) ×2 IMPLANT
DRAIN INCS SIL RND BLAK 15FR STRL 4 CHNL (Drain)
DRAIN OD15 FR 4 CHANNEL RADIOPAQUE (Drain) IMPLANT
DRAIN OD15 FR 4 CHANNEL RADIOPAQUE HUBLESS SOLID CORE CENTER BLAKE (Drain) ×2 IMPLANT
DRAPE 3/4 SHEET FANFLD 52X76IN (Drape) ×3 IMPLANT
DRESSING TRANSPARENT L4 3/4 IN X W4 IN (Dressing) IMPLANT
DRESSING TRANSPARENT L4 3/4 IN X W4 IN POLYURETHANE ADHESIVE (Dressing) ×4 IMPLANT
DRESSING TRNS PU STD TGDRM 4.75X4IN LF (Dressing)
EVACUATOR WOUND SILICONE 100CC (Drain) ×2 IMPLANT
GLOVE SURG BIOGEL INDIC SZ 7.5 (Glove) ×3 IMPLANT
GLOVE SURG BIOGEL MICRO SZ7 (Glove) ×3 IMPLANT
GLOVE SURG BIOGEL PF LTX SZ7.0 (Glove) ×3 IMPLANT
HFN RH 130 DEG 9MM X 320MM (Nail) ×3 IMPLANT
IRRIGATOR SUCTN PUMP/HANDPIECE (Suction) ×3 IMPLANT
KIT INFECTION CONTROL CUSTOM (Kits) ×3 IMPLANT
KIT INFECTION CONTROL CUSTOM IFOH03 (Kits) ×2 IMPLANT
LIGATOR ESCP PDS2 0 E-LP 18IN LF MFL TIE (Suture) ×3
LIGATOR L18 IN ENDOLOOP MONOFILAMENT TIE (Suture) ×2 IMPLANT
LIGATOR L18 IN ENDOLOOP MONOFILAMENT TIE LOOP SUTURE ENDOSCOPIC PDS II (Suture) ×2 IMPLANT
LINER SCT 1500CC THNWL CRD MDVC LF LCK (Suction) ×3
LINER SUCTION 1500 CC LOCK LID SHUTOFF (Suction) ×2 IMPLANT
NEEDLE REG BEVEL 20GX1.5IN (Needles) ×3 IMPLANT
POUCH INST 18X30CM (Drape) ×2 IMPLANT
SCISSORS L21 CM ENDOPATH 360 D CURVE (Instrument) IMPLANT
SCISSORS L21 CM ENDOPATH 360 D CURVE RATCHET HANDLE MONOPOLAR CAUTERY (Instrument) ×2 IMPLANT
SCISSORS LAPSCP 360D CRV EPTH 5MM 21CM (Instrument)
SEAL SCOPE WARMER (Procedure Accessories) ×2 IMPLANT
SHEAR SCALP HARM ACE (Cautery) ×2 IMPLANT
SHEARS LAPAROSCOPIC L45 CM CURVE TIP (Cautery) IMPLANT
SHEARS LAPAROSCOPIC L45 CM CURVE TIP ADVANCE HEMOSTASIS ULTRASONIC OD5 (Cautery) ×2 IMPLANT
SHEARS LAPSCP HRMN A +7 5MM 45CM CRV TIP (Cautery)
SOLUTION IRR 0.9% NACL 1000ML LF STRL (Irrigation Solutions) ×3
SOLUTION IRRIGATION 0.9% SODIUM CHLORIDE (Irrigation Solutions) ×2 IMPLANT
SOLUTION IRRIGATION 0.9% SODIUM CHLORIDE 1000 ML PLASTIC POUR BOTTLE (Irrigation Solutions) ×2 IMPLANT
SOLUTION IV LACTATED RINGERS 1000 ML (IV Solutions) ×2 IMPLANT
SOLUTION IV LACTATED RINGERS 1000 ML PLASTIC CONTAINER (IV Solutions) ×2 IMPLANT
SOLUTION IV LR 1000ML VFLX LF PLS CNTNR (IV Solutions) ×3
SPONGE CHLRPRP TINT 26ML (Applicator) ×3 IMPLANT
SPONGE KITTNER ENDOSCOPIC (Sponge) ×3 IMPLANT
SUTURE ABS 2-0 SH VCL 27IN BRD COAT VIOL (Suture) ×6
SUTURE COATED VICRYL 2-0 SH L27 IN BRAID (Suture) ×4 IMPLANT
SUTURE COATED VICRYL 2-0 SH L27 IN BRAID COATED VIOLET ABSORBABLE (Suture) ×4 IMPLANT
SUTURE ETHIBOND 2-0 SH 30IN (Suture) ×1 IMPLANT
SUTURE MONOCRYL 4-0 PS2 27IN (Suture) ×2 IMPLANT
SUTURE VICRYL 0 UR6 27IN (Suture) ×3 IMPLANT
SYRINGE LEUR LOK TIP 30 ML (Syringes, Needles) ×3 IMPLANT
SYSTEM DRG DLV DEHP ONQ PNBSTR SLSKR 5IN (Catheter) ×3 IMPLANT
SYSTEM DRUG DELIVERY L5 IN CATHETER (Catheter) ×2 IMPLANT
SYSTEM DRUG DELIVERY L5 IN CATHETER EXPANSION KIT ON-Q* DEHP (Catheter) ×2 IMPLANT
TRAY CATH FOLEY 16F (Catheter Micellaneous)
TRAY CATH FOLEY 16F (Catheter Miscellaneous) ×2 IMPLANT
TROCAR BLADELESS ENDO 12X10MM (Laparoscopy Supplies) ×3 IMPLANT
TUBE SET HEATED INSUFLATOR (Tubing) ×3 IMPLANT
TUNNELER SRG ONQ 17GA 8IN LF STRL SHTH (Procedure Accessories) ×3
TUNNELER SURGICAL L8 IN SHEATH OD17 GA (Procedure Accessories) ×2 IMPLANT
TUNNELER SURGICAL L8 IN SHEATH OD17 GA ON-Q* (Procedure Accessories) ×2 IMPLANT

## 2012-02-18 NOTE — Anesthesia Postprocedure Evaluation (Signed)
Anesthesia Post Evaluation    Patient: Leslie Gross    Procedures performed: Procedure(s) with comments:  LAPAROSCOPIC, CHOLECYSTECTOMY, CHOLANGIOGRAM -       LAPAROSCOPY, DIAGNOSTIC - REPAIR OF INTERNAL HERNIA, ADHESIOLYSIS    Anesthesia type: General ETT    Patient location:Phase I PACU    Last vitals:   Filed Vitals:    02/18/12 1202   BP: 111/53   Pulse: 60   Temp: 96.5 F (35.8 C)   Resp: 16   SpO2: 98%       Post pain: Patient not complaining of pain, continue current therapy     Mental Status:awake    Respiratory Function: tolerating face mask    Cardiovascular: stable    Nausea/Vomiting: patient not complaining of nausea or vomiting    Hydration Status: adequate    Post assessment: no apparent anesthetic complications, no reportable events and no evidence of recall

## 2012-02-18 NOTE — Op Note (Signed)
OP NOTE    Date Time: 02/18/2012 5:23 PM    Patient Name:   Leslie Gross    Date of Operation:   02/18/2012    Providers Performing:    Surgeon(s) and Role:     * Delorse Lek, MD - Primary   Esam Alnouman 1st assist  who was essential and present throughout the procedure to help in   positioning, transfer, port placement, skin closure, retraction and   exposure during critical parts of the procedure, also running the camera   and scope.       Operative Procedure:   Procedure(s) with comments:  1.LAPAROSCOPIC, CHOLECYSTECTOMY, CHOLANGIOGRAM -   2.LAPAROSCOPY, DIAGNOSTIC - REPAIR OF INTERNAL HERNIA, 3.ADHESIOLYSIS    Preoperative Diagnosis:   Pre-Op Diagnosis Codes:     * Abdominal pain, right upper quadrant [789.01]    Postoperative Diagnosis:   Abdominal pain, right upper quadrant  Sludge pancreatitis  Internal hernia    Anesthesia:   General    Estimated Blood Loss:   Minimal       Implants:   * No implants in log *    Drains:   Drains: no    Specimens:        SPECIMENS (last 24 hours)      Non-Carefusion Specimens     Row Name 02/18/12 1600                Specimen Information    Specimen Testing Required Routine Pathology     Specimen ID (letter) A     Specimen Description GALL BLADDER AND CONTENTS           Findings:   Same  *    Complications:   None  *    INDICATIONS:   Leslie Gross is a pleasant 61 y.o. year-old morbidly obese female who presented   with signs, symptoms, clinical picture, and imaging studies consistent with   Sludge pancreatitis and abnormal MRCP. A laparoscopic cholecystectomy was discussed with patiet and recommended to her. The risks included, but not limited to bleeding, infection, sepsis, shock, wound infection, wound   herniation, deep vein thrombosis, pulmonary embolism, death, unforeseen   conditions, common bile duct injury, bowel injury, bile leak,   internal organ injury were all discussed. Patient understood and   wished to proceed. All of her questions and concerns were  addressed, and   she wished to proceed with the above-mentioned plan. She was also advised to have a diagnostic laparoscopy as well to evaluate for internal hernias. She was identified by   myself in the preoperative area where patient received preoperative antibiotic.     DESCRIPTION OF PROCEDURE:   [name] was then taken to operating room, was placed in supine position. General endotracheal anesthesia was instituted. The abdomen was prepped and draped using normal sterile fashion. Surgical pause was made followed by an Incision in the supraumbilical region through which a 5 mm Optiview blunt-tipped trocar was placed under direct vision and laparoscope in the abdominal cavity. The abdomen was insufflated. No intraabdominal injuries from trocar insertion were noted. A 5 mm trocar was placed in the right flank. A 5 mm trocar was placed in the subcostal region at the mid rectus area. A 12 mm blunt-tip trocar was placed in the epigastric region to the right of   midline. The gallbladder was then grasped and retracted superiorly and laterally. The peritoneum covering the cystic duct and cystic artery was identified and divided  bluntly. The cystic artery was noted to be fairly large in caliber than usual. Hence, it was dissected all the way to its insertion point to the infundibulum of the gallbladder. The cystic artery was skeletonized, clipped, and divided. Cystic artery was also skeletonized. A clip was placed on the distal cystic duct and a cholangiogram catheter was placed into the cystic duct through an opening.   The cholangiogram was then obtained using fluoroscopy, which revealed no   filling defects within the common bile duct. No filling defects were noted   in the proximal left and right hepatic duct. Filling of the duodenum   through the sphincter of Oddi was noted and the proximal pancreatic duct was also noted. No filling defects were noted in any of the above-mentioned   Structures. However it  was noted that the CBD had an abrupt transition and narrow at the sphincter of Oddi which would not be  Expected since she had a sphincterotomy last year during her ERCP at [facility].  At this point, the cholangiogram catheter was removed   cholangiogram was completed. An Endoloop PDS were placed in the proximal cystic   duct and then divided. The gallbladder was then removed from its liver bed   using electrocautery. The gallbladder was retrieved through the epigastric trocar site. The hemostasis of the gallbladder fossa was ensured.    Attention was then turned to the left side of abdomen adhesions to midline abdomen were noted and divided.   Also an internal hernia was noted at the peterson defect with no incarcerated bowel. The internal hernia was then repaired using a running locking 2-0 Ethibond suture to minimize the risk of incarcerated internal hernias in future. The Jejuno-jejunostomy was also inspected and was noted to be free of any kinks, adhesions or intussusception.     Trocars were all removed under direct vision. The skin incisions were approximated using 4-0 Monocryl suture.   Sterile dressing was applied. The patient was then extubated and taken to   the post anesthesia care unit without any complications, tolerating the   procedure well.     Signed by: Delorse Lek, MD                                                                              [place] [facility]

## 2012-02-18 NOTE — Transfer of Care (Signed)
Report given to nurse. Care transferred.
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
## 2012-02-18 NOTE — Discharge Instructions (Signed)
Surgical Discharge Instructions:    Procedure Date:  02/18/2012  Procedure:  Procedure(s):  LAPAROSCOPIC, CHOLECYSTECTOMY, CHOLANGIOGRAM  LAPAROSCOPY, DIAGNOSTIC  Surgeon: Delorse Lek, MD    Wound Care:  keep wound clean and dry      Drain(s):  Record the output of your drain at least daily or every time you empty your drain.  Bring this record with you when you come for your appointment.    Pain Control:  Some pain and swelling is normal after surgery.  You may take the pain medication as prescribed.  You may also ice your incision with an ice pack wrapped in a towel for 20 min as needed.    Activity:   activity as tolerated and no driving for today           Driving:  You may drive when you are no longer taking narcotic pain medications.      Bathing:    You may shower 24 hours after surgery.  You should not soak in a bath tub or go swimming for 3 weeks.     Diet: clear liquids, advance as tolerated  You may not feel hungry all them time or you may eat a small amount and feel full.  This is ok.  Some people find eating multiple small meals is easier than 3 large meals.  Continue to drink lots of clear fluids.      Reasons to call the doctor:  Call your doctor or go to emergency room if you have any of the following:   Fever greater than 101.5 F   Pus draining from your wound   Redness around your wound that is spreading   Pain not controlled with medications   Chest pain   Shortness of breath   Nausea/vomiting    You have received Toradol 30mg  at 5:10pm . Do not take Ibuprofen/Advil/Motrin until 11:10pm.  You have received Percocet at 7:30pm. Do not take any additional pain medication until 1:30am.   Do not take additional Tylenol products while taking Percocet    Anesthesia: After Your Surgery  You've just had surgery. During surgery, you received medication called anesthesia to keep you comfortable and pain-free. After surgery, you may experience some pain or nausea. This is normal. Here are some tips  for feeling better and recovering after surgery.     Stay on schedule with your medication.   Going Home  Your doctor or nurse will show you how to take care of yourself when you go home. He or she will also answer your questions. Have an adult family member or friend drive you home. For the first 24 hours after your surgery:   Do not drive or use heavy equipment.   Do not make important decisions or sign documents.   Avoid alcohol.   Have someone stay with you, if possible. They can watch for problems and help keep you safe.  Be sure to keep all follow-up doctor's appointments. And rest after your procedure for as long as your doctor tells you to.    Coping with Pain  If you have pain after surgery, pain medication will help you feel better. Take it as directed, before pain becomes severe. Also, ask your doctor or pharmacist about other ways to control pain, such as with heat, ice, and relaxation. And follow any other instructions your surgeon or nurse gives you.    Tips for Taking Pain Medication  To get the best relief possible, remember these points:  Pain medications can upset your stomach. Taking them with a little food may help.   Most pain relievers taken by mouth need at least 20 to 30 minutes to take effect.   Taking medication on a schedule can help you remember to take it. Try to time your medication so that you can take it before beginning an activity, such as dressing, walking, or sitting down for dinner.   Constipation is a common side effect of pain medications. Drink lots of fluids. Eating fruit and vegetables can also help. Don't take laxatives unless your surgeon has prescribed them.   Mixing alcohol and pain medication can cause dizziness and slow your breathing. It can even be fatal. Don't drink alcohol while taking pain medication.   Pain medication can slow your reflexes. Don't drive or operate machinery while taking pain medication,    Managing Nausea  Some people have an upset  stomach after surgery. This is often due to anesthesia, pain, pain medications, or the stress of surgery. The following tips will help you manage nausea and get good nutrition as you recover. If you were on a special diet before surgery, ask your doctor if you should follow it during recovery. These tips may help:   Don't push yourself to eat. Your body will tell you what to eat and when.   Start off with liquids and soup. They are easier to digest.   Progress to semisolids (mashed potatoes, applesauce, and gelatin) as you feel ready.   Slowly move to solid foods. Don't eat fatty, rich, or spicy foods at first.   Don't force yourself to have three large meals a day. Instead, eat smaller amounts more often.   Take pain medications with a small amount of solid food, such as crackers or toast.    Important:   Prevent blood clots by wiggling your toes and tightening your calf muscle 20 times every hour while awake until you resume normal activity.   If unable to urinate in 6-8 hours, call your surgeon or go to the Emergency Department.     Call Your Surgeon If.   You still have pain an hour after taking medication (it may not be strong enough).   You feel too sleepy, dizzy, or groggy (medication may be too strong).   You have side effects like nausea, vomiting, or skin changes (rash, itching, or hives).   Signs of infection - fever over 101, chills. Incision site redness, warmth, swelling, foul odor or purulent drainage.   Persistent bleeding at the operative site.    [address] [place], [place] [address]. All rights reserved. This information is not intended as a substitute for professional medical care. Always follow your healthcare professional's instructions.

## 2012-02-18 NOTE — Brief Op Note (Signed)
BRIEF OP NOTE    Date Time: 02/18/2012 5:21 PM    Patient Name:   Leslie Gross    Date of Operation:   02/18/2012    Providers Performing:    Surgeon(s) and Role:     * Delorse Lek, MD - Primary   Esam Alnouman assist 1st  who was essential and present throughout the procedure to help in   positioning, transfer, port placement, skin closure, retraction and   exposure during critical parts of the procedure, also running the camera   and scope.     Operative Procedure:   Procedure(s) with comments:  LAPAROSCOPIC, CHOLECYSTECTOMY, CHOLANGIOGRAM -       LAPAROSCOPY, DIAGNOSTIC - REPAIR OF INTERNAL HERNIA, ADHESIOLYSIS    Preoperative Diagnosis:   Pre-Op Diagnosis Codes:     * Abdominal pain, right upper quadrant [789.01]    Postoperative Diagnosis:   Abdominal pain, right upper quadrant  Internal hernia    Anesthesia:   General    Estimated Blood Loss:   Minimal       Implants:   * No implants in log *    Drains:   Drains: no    Specimens:        SPECIMENS (last 24 hours)      Non-Carefusion Specimens     Row Name 02/18/12 1600                Specimen Information    Specimen Testing Required Routine Pathology     Specimen ID (letter) A     Specimen Description GALL BLADDER AND CONTENTS           Findings:   Same  *same    Complications:   None  *    Signed by: Delorse Lek, MD                                                                              [facility]

## 2012-09-13 ENCOUNTER — Emergency Department: Payer: Medicare Other

## 2012-09-13 ENCOUNTER — Emergency Department
Admission: EM | Admit: 2012-09-13 | Discharge: 2012-09-13 | Disposition: A | Discharge status: Home / Self Care | Payer: Medicare Other | Attending: Emergency Medical Services | Admitting: Emergency Medical Services

## 2012-09-13 DIAGNOSIS — R197 Diarrhea, unspecified: Secondary | ICD-10-CM

## 2012-09-13 DIAGNOSIS — A0472 Enterocolitis due to Clostridium difficile, not specified as recurrent: Secondary | ICD-10-CM | POA: Insufficient documentation

## 2012-09-13 DIAGNOSIS — R1012 Left upper quadrant pain: Secondary | ICD-10-CM | POA: Insufficient documentation

## 2012-09-13 DIAGNOSIS — R109 Unspecified abdominal pain: Secondary | ICD-10-CM

## 2012-09-13 HISTORY — DX: Unspecified glaucoma: H40.9

## 2012-09-13 LAB — URINALYSIS, REFLEX TO MICROSCOPIC EXAM IF INDICATED
Bilirubin, UA: NEGATIVE
Blood, UA: NEGATIVE
Glucose, UA: NEGATIVE
Ketones UA: 15 — AB
Nitrite, UA: NEGATIVE
Protein, UR: 30 — AB
Specific Gravity UA: 1.029 (ref 1.001–1.035)
Urine pH: 6 (ref 5.0–8.0)
Urobilinogen, UA: 2 mg/dL

## 2012-09-13 LAB — MAN DIFF ONLY
Band Neutrophils Absolute: 2.47 10*3/uL — ABNORMAL HIGH (ref 0.00–1.00)
Band Neutrophils: 14 % — ABNORMAL HIGH (ref 0–9)
Basophils Absolute Manual: 0.18 10*3/uL (ref 0.00–0.20)
Basophils Manual: 1 % (ref 0–2)
Eosinophils Absolute Manual: 0 10*3/uL (ref 0.00–0.70)
Eosinophils Manual: 0 % (ref 0–5)
Lymphocytes Absolute Manual: 1.06 10*3/uL (ref 0.50–4.40)
Lymphocytes Manual: 6 % — ABNORMAL LOW (ref 15–41)
Monocytes Absolute: 0.53 10*3/uL (ref 0.00–1.20)
Monocytes Manual: 3 % (ref 1–11)
Neutrophils Absolute Manual: 13.4 10*3/uL — ABNORMAL HIGH (ref 1.80–8.10)
Nucleated RBC: 0 /100 WBC (ref 0–1)
Segmented Neutrophils: 76 % — ABNORMAL HIGH (ref 52–75)

## 2012-09-13 LAB — BASIC METABOLIC PANEL
Anion Gap: 10 (ref 5.0–15.0)
BUN: 8 mg/dL (ref 7–19)
CO2: 23 mEq/L (ref 22–29)
Calcium: 8.5 mg/dL (ref 8.5–10.5)
Chloride: 107 mEq/L (ref 98–107)
Creatinine: 0.8 mg/dL (ref 0.6–1.0)
Glucose: 124 mg/dL — ABNORMAL HIGH (ref 70–100)
Potassium: 4.2 mEq/L (ref 3.5–5.1)
Sodium: 140 mEq/L (ref 136–145)

## 2012-09-13 LAB — CBC AND DIFFERENTIAL
Hematocrit: 37.1 % (ref 37.0–47.0)
Hgb: 12.2 g/dL (ref 12.0–16.0)
MCH: 31 pg (ref 28.0–32.0)
MCHC: 32.9 g/dL (ref 32.0–36.0)
MCV: 94.4 fL (ref 80.0–100.0)
MPV: 10.5 fL (ref 9.4–12.3)
Platelets: 322 10*3/uL (ref 140–400)
RBC: 3.93 10*6/uL — ABNORMAL LOW (ref 4.20–5.40)
RDW: 12 % (ref 12–15)
WBC: 17.63 10*3/uL — ABNORMAL HIGH (ref 3.50–10.80)

## 2012-09-13 LAB — STOOL OCCULT BLOOD: Stool Occult Blood: POSITIVE — AB

## 2012-09-13 LAB — HEPATIC FUNCTION PANEL
ALT: 9 U/L (ref 0–55)
AST (SGOT): 16 U/L (ref 5–34)
Albumin/Globulin Ratio: 1.2 (ref 0.9–2.2)
Albumin: 3.1 g/dL — ABNORMAL LOW (ref 3.5–5.0)
Alkaline Phosphatase: 100 U/L (ref 40–150)
Bilirubin Direct: 0.2 mg/dL (ref 0.0–0.5)
Bilirubin Indirect: 0.2 mg/dL (ref 0.0–1.1)
Bilirubin, Total: 0.4 mg/dL (ref 0.2–1.2)
Globulin: 2.6 g/dL (ref 2.0–3.6)
Protein, Total: 5.7 g/dL — ABNORMAL LOW (ref 6.0–8.3)

## 2012-09-13 LAB — CELL MORPHOLOGY
Cell Morphology: NORMAL
Platelet Estimate: NORMAL

## 2012-09-13 LAB — C-REACTIVE PROTEIN: C-Reactive Protein: <0.2 mg/dL (ref 0.0–0.8)

## 2012-09-13 LAB — STOOL FOR WBC

## 2012-09-13 LAB — SEDIMENTATION RATE: Sed Rate: 16 mm/Hr (ref 0–23)

## 2012-09-13 LAB — GFR: EGFR: >60

## 2012-09-13 LAB — LIPASE: Lipase: 6 U/L — ABNORMAL LOW (ref 8–78)

## 2012-09-13 MED ORDER — HYDROMORPHONE HCL 2 MG PO TABS
2.00 mg | ORAL_TABLET | Freq: Four times a day (QID) | ORAL | Status: AC | PRN
Start: 2012-09-13 — End: 2012-09-23

## 2012-09-13 MED ORDER — IOHEXOL 300 MG/ML IJ SOLN
30.00 mL | Freq: Once | INTRAMUSCULAR | Status: AC
Start: 2012-09-13 — End: 2012-09-13
  Administered 2012-09-13: 30 mL via ORAL

## 2012-09-13 MED ORDER — ONDANSETRON 4 MG PO TBDP
4.00 mg | ORAL_TABLET | Freq: Four times a day (QID) | ORAL | Status: AC | PRN
Start: 2012-09-13 — End: 2012-09-28

## 2012-09-13 MED ORDER — PREDNISONE 20 MG PO TABS
40.00 mg | ORAL_TABLET | Freq: Every day | ORAL | Status: AC
Start: 2012-09-14 — End: 2012-09-19

## 2012-09-13 MED ORDER — HYDROMORPHONE HCL PF 1 MG/ML IJ SOLN
1.00 mg | Freq: Once | INTRAMUSCULAR | Status: AC
Start: 2012-09-13 — End: 2012-09-13
  Administered 2012-09-13: 1 mg via INTRAVENOUS
  Filled 2012-09-13: qty 1

## 2012-09-13 MED ORDER — VANCOMYCIN ORAL SOLUTION 25 MG/ML
250.00 mg | Freq: Four times a day (QID) | ORAL | Status: AC
Start: 2012-09-13 — End: 2012-09-27

## 2012-09-13 MED ORDER — ONDANSETRON HCL 4 MG/2ML IJ SOLN
4.00 mg | Freq: Once | INTRAMUSCULAR | Status: AC
Start: 2012-09-13 — End: 2012-09-13
  Administered 2012-09-13: 4 mg via INTRAVENOUS
  Filled 2012-09-13: qty 2

## 2012-09-13 MED ORDER — PREDNISONE 20 MG PO TABS
40.00 mg | ORAL_TABLET | Freq: Once | ORAL | Status: AC
Start: 2012-09-13 — End: 2012-09-13
  Administered 2012-09-13: 40 mg via ORAL
  Filled 2012-09-13: qty 2

## 2012-09-13 MED ORDER — SODIUM CHLORIDE 0.9 % IV BOLUS
1000.00 mL | Freq: Once | INTRAVENOUS | Status: AC
Start: 2012-09-13 — End: 2012-09-13
  Administered 2012-09-13: 1000 mL via INTRAVENOUS

## 2012-09-13 NOTE — Discharge Instructions (Signed)
Dear Ms. Leslie Gross:    I appreciate your choosing the [facility] for your healthcare needs, and hope your visit today was EXCELLENT.    Instructions:  Please follow-up with Dr.  Sherryll Burger (Gastroenterologist) as soon as possible.    Medications:  Stop the Flagyl.  Take Vancomycin as prescribed, 250 milligrams 4 times per day. This prescription can only be filled at [facility] on [address] [place], [place] [address].  Take Prednisone as prescribed, 40 milligrams once per day.  Take Zofran as prescribed for nausea.  Take Dilaudid as prescribed for breakthrough pain.  Take Colace as prescribed to prevent constipation.      Below is some information that our patients often find helpful.    We wish you good health and please do not hesitate to contact us if we can ever be of any assistance.    Sincerely,  [place], Alexander-Nicholas D., MD  [facility]    ________________________________________________________________    If you do not continue to improve or your condition worsens, please contact your doctor or return immediately to the Emergency Department.    Thank you for choosing [facility] for your emergency care needs.  We strive to provide EXCELLENT care to you and your family.      DOCTOR REFERRALS  Call [phone] if you need any further referrals and we can help you find a primary care doctor or specialist.  Also, available online at:  [email or web address]    YOUR CONTACT INFORMATION  Before leaving please check with registration to make sure we have an up-to-date contact number.  You can call registration at [phone] to update your information.  For questions about your hospital bill, please call [phone].  For questions about your Emergency Dept Physician bill please call [phone].      FREE HEALTH SERVICES  If you need help with health or social services, please call 2-1-1 for a free referral  to resources in your area.  2-1-1 is a free service connecting people with information on health insurance, free clinics, pregnancy, mental health, dental care, food assistance, housing, and substance abuse counseling.  Also, available online at:  http://[email or web address].org    MEDICAL RECORDS AND TESTS  Certain laboratory test results do not come back the same day, for example urine cultures.   We will contact you if other important findings are noted.  Radiology films are often reviewed again to ensure accuracy.  If there is any discrepancy, we will notify you.      Please call [phone] to pick up a complimentary CD of any radiology studies performed.  If you or your doctor would like to request a copy of your medical records, please call [phone].      ORTHOPEDIC INJURY   Please know that significant injuries can exist even when an initial x-ray is read as normal or negative.  This can occur because some fractures (broken bones) are not initially visible on x-rays.  For this reason, close outpatient follow-up with your primary care doctor or bone specialist (orthopedist) is required.    MEDICATIONS AND FOLLOWUP  Please be aware that some prescription medications can cause drowsiness.  Use caution when driving or operating machinery.    The examination and treatment you have received in our Emergency Department is provided on an emergency basis, and is not intended to be a substitute for your primary care physician.  It is important that your doctor checks you again and that you report any new or remaining problems at that time.      24 HOUR PHARMACIES  [facility] - [address], [place], [place] [address] (1.4 miles, 7 minutes)  [facility] - [address], [place], [place] [address] (6.5 miles, 13 minutes)  Handout with directions available on request      C. diff Enteritis    You have been diagnosed with Clostridium difficile enteritis.    Clostridium difficile (C. diff) is a type of bacteria that produces  diarrhea (enteritis) when it overgrows in the intestine. The overgrowth is caused when antibiotics that are taken in the recent past (a few weeks before the start of the C. diff diarrhea), kill off the good bacteria that normally live in the intestine. This allows the C. diff bacteria (that is not killed by most antibiotics) to grow out of control in the intestine. Symptoms include fever, diarrhea that may be bloody, and abdominal (belly) pain. You also may feel weak and nauseated.    Diagnosis is made by your symptoms, knowing that you recently took antibiotics, and a special test of your stools.    Treatment is usually with one of two antibiotics, Flagyl (metronidazole) or Vancomycin. It is important to drink plenty of fluids to avoid dehydration and finish all antibiotics. If you consume ANY alcohol while taking the medication, Flagyl, you will become violently ill. Avoid ALL alcohol for 1-2 days before and after taking the medication.    You should follow up with your primary care doctor in 2-3 days for a recheck and to make certain your symptoms are improving.    YOU SHOULD SEEK MEDICAL ATTENTION IMMEDIATELY, EITHER HERE OR AT THE NEAREST EMERGENCY DEPARTMENT, IF ANY OF THE FOLLOWING OCCURS:   Increasing abdominal (belly) pain.   Worsening symptoms at any time.   Inability to keep down liquids.   Continued or rising fevers after 2-3 days of antibiotics.   If feeling lightheaded or weak.   For increasing bloody diarrhea.   If feeling sicker at any time, or if not improving as expected.      Diarrhea    You have been diagnosed with diarrhea.    You have diarrhea when you have stools (bowel movements) that are soft or liquid, or when you have too many stools in a day. You may also have stomach cramps/ pains, nausea (feeling sick to your stomach), vomiting and fever.    Diarrhea can be caused by bacteria, viruses and parasites. People sometimes get "traveler s diarrhea." They get it when they go to  other countries. It is caused by bacteria.    People with diarrhea often lose a lot of body fluids. This causes dehydration. Drink a lot of water or other fluids to stay hydrated. You may also be given medicine for your diarrhea. It will slow the diarrhea and stop the vomiting (if you have this symptom).    You should drink lots of natural juices or a sports-type drink that has electrolytes (sodium, potassium, etc). Do not drink a lot of plain water. Sugary drinks like apple and pear juice might make the diarrhea worse.    You might be given medicine to help you with your diarrhea, nausea (feeling sick), vomiting and stomach cramps. This will depend on your age, medical history and symptoms.    It is safe for you to go home today.    Good hygiene (keeping clean) helps to keep the  problem from spreading. Please wash your hands often, using soap and water, especially after using the bathroom. Do not prepare food or share food, drinks or utensils (forks, knives, etc.) with other people.    While you were here today your stool may have been sent for special tests. In a few days, you will need to follow up with your regular doctor. You will get the test results and find out if you need any changes in your treatment. We will try to reach you if there are any results that need attention right away.    It is very important that you follow up with your regular doctor or a specialist. The doctor will tell you how soon this follow-up needs to be.    YOU SHOULD SEEK MEDICAL ATTENTION IMMEDIATELY, EITHER HERE OR AT THE NEAREST EMERGENCY DEPARTMENT, IF ANY OF THE FOLLOWING OCCURS:   You are vomiting and cannot keep down fluids.   There is blood, pus or mucous in your stool.   You have symptoms of dehydration. These include dry mouth, not urinating at least once every eight hours, feeling dizzy/lightheaded, severe weakness or passing out.      [place] Abdominal Pain    Today you were evaluated for abdominal pain. It may  seem strange to go home without a diagnosis of what is causing your abdominal pain. As many as half of patients who come to the Emergency Department with abdominal pain, do not receive a clear diagnosis.     This may be because nothing serious is wrong, OR there may be something significant going on and it is too early to tell exactly what it is with the information we have available to [place] today.     There are three important things to know:     (1) We have NOT given you a DEFINITE diagnosis of what is causing the pain, but based on your history and exam, we do not think it is serious at this time. Since you do not have a definite diagnosis, you must still BE VERY WATCHFUL and RETURN IMMEDIATELY if the symptoms fail to improve or worsen. There is always a small, but real possibility, that there is something more seriously wrong, such as appendicitis.     (2) We recommend that a physician see you within the next 12-24 hours for a re-evaluation if the symptoms persist. If your physician is unavailable, we would be happy to see you here.     (3) We feel comfortable sending you home at this time, but there are some things that worry [place] more. We want you to return immediately to the Emergency Department if you develop:     Increasing pain -- or pain that does not go away   Persistent vomiting, especially if there is no diarrhea associated with this illness    Vomiting blood or blood in your stool. Blood can be bright red, dark red, or black and tarry if it is digested   Passing out or feeling extremely lightheaded   Yellowing of your skin or eyes   Uncontrollable diarrhea
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
## 2012-09-13 NOTE — ED Notes (Addendum)
Patient c/o luq pain and left flank pain x 2 weeks. Patient was on Flagyl for diarrhea. Also c/o nausea. Last night, patient noticed blood in her stool. Feels lightheaded; had near syncopal episode last evening. States she is unable to tolerate anything by mouth.

## 2012-10-14 HISTORY — PX: ROTATOR CUFF REPAIR: SHX139

## 2012-10-16 NOTE — Op Note (Signed)
MRN: [id number] DOCUMENT ID: [id number]      INTRODUCTION:      62 YEAR OLD FEMALE PATIENT PRESENTS FOR AN ELECTIVE OUTPATIENT EGD.  THE      INDICATIONS FOR THE PROCEDURE WERE AND HISTORY OR GASTRIC BYPASS, WITH      ABDOMINAL PAIN AND POSSIBLE STRICTURE.      CONSENT:      THE BENEFITS, RISKS, AND ALTERNATIVES TO THE PROCEDURE WERE DISCUSSED AND      INFORMED CONSENT WAS OBTAINED.      PREPARATION:      EKG, PULSE, PULSE OXIMETRY AND BLOOD PRESSURE MONITORED.      MEDICATIONS:      - MAC ANESTHESIA WAS ADMINISTERED DURING THE PROCEDURE      PROCEDURE:      THE ENDOSCOPE WAS PASSED.  THE STUDY WAS PERFORMED WITH A GIF 160.      FINDINGS:  THE SCOPE WAS ADVANCED TO THE GASTRIC POUCH.  A TIGHT      ANASTOMOTIC STRICTURE WAS NOTED.  THE SCOPE COULD NOT BE PASSED.  USING      THE TTS-CRE BALLOON, THE STRICTURE WAS DILATED UPTO 12 MM.  THE SCOPE WAS      PASSED AND BOTH LIMBS WERE NOTED.  THE SCOPE WAS WITHDRAWN BACK.  A      SCHATZKI'S RING WAS NOTED AND BIOPSIED.      IMPRESSION:      1.  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  A SCHATZKI'S RING-BIOPSIED.  (UNCODED).      RECOMMENDATION:      - CONTINUE PREVACID      - ADVANCE DIET SLOWLY      - WILL FOLLOW SYMPTOMATICALLY.      SIGNING PHYSICIAN: Barbaraann Cao

## 2012-10-16 NOTE — Op Note (Signed)
ROOM NUMBER:                           OPS OPS 17            SURGEON:                                Fransico Setters, MD            DATE:                                   07/19/2005            PREOPERATIVE DIAGNOSIS:  Morbid obesity with history of deep venous      thrombosis.            POSTOPERATIVE DIAGNOSIS: Morbid obesity with history of deep venous      thrombosis.            OPERATION:      1. Venacavogram.      2. Insertion of inferior vena cava filter.            ANESTHESIA:  Local with sedation.            HISTORY: This is a 62 year old female who is going to be going under IVC      filter insertion on July 23, 2005. Therefore the decision was made to      proceed with the above procedure for prophylaxis against deep venous      thrombosis. The risks and benefits have been quoted and agreed.            PROCEDURE: The patient was brought to the operating suite and was placed in      the supine position. Both groins were prepped and draped in sterile      fashion. Then the right femoral vein was cannulated without any      difficulties. The 0.35 wire was advanced to the inferior vena cava. A      6-French sheath was inserted through the infrarenal vena cava. A      venocavogram was obtained which identified the level of renal veins. Then      an OptEase filter, Cordis, was used and was advanced to the L2-L3 level.      The sheath was pulled back and the filter was deployed. A hard copy was      obtained and the filter appeared to be secured in the inferior vena cava.      Then the sheath was removed. Pressure was applied for 15 minutes.            The wound was closed using Indermil. Then pressure dressings were applied.      The patient is going to be on bedrest for 2 hours.                  Electronic Signing MD: Fransico Setters, MD            HAH:[id number]      Dictated:    07/19/2005  3:59 P      Transcribed: 07/20/2005  1:56 P      Job Number: [id number]      Document Number: [id number]            CC:   Meygan Kyser A  Lucianne Muss, MD           Delorse Lek, MD

## 2012-10-16 NOTE — Discharge Summary (Signed)
ATTENDING PHYSICIAN:                 Delorse Lek, MD            DATE OF ADMISSION:                   07/23/2005      DATE OF DISCHARGE:                   07/25/2005                  ADMISSION DIAGNOSES:  Morbid obesity, sleep apnea, gout, history of      pulmonary embolism, mitral valve prolapse, gastroesophageal reflux      disease.            DISCHARGE DIAGNOSES:  Morbid obesity, sleep apnea, gout, history of      pulmonary embolism, mitral valve prolapse, gastroesophageal reflux      disease.            PROCEDURE PERFORMED:   Status post laparoscopic Roux-en-Y gastric bypass      surgery.            COMPLICATIONS:  None.            EBL:  None.            DISPOSITION:  PACU.            HISTORY OF PRESENT ILLNESS:  The patient is a very pleasant 62 year old      lady who presented to my office for evaluation of her morbid obesity and      consideration for weight loss surgery.  She underwent an extensive      preoperative education and opted to proceed with laparoscopic Roux-en-Y      gastric bypass surgery.  After preoperative workup, she was determined to      be a good candidate to proceed with laparoscopic Roux-en-Y gastric bypass      surgery.  She received preoperative IVC filter.  She was admitted to the      hospital and received preoperative anticoagulation and uneventful      laparoscopic Roux-en-Y gastric bypass gastric bypass surgery on      07/23/2005.            Postoperatively, she has done well.  On postoperative day #0, she was kept      n.p.o. Her urine output was normal, and her cardiopulmonary status was      being monitored. On postoperative day #1, she had an upper GI, small-bowel      follow-through which showed no lesion and no blockage. Her diet was      advanced to a liquid diet, and she was advanced to a bariatric liquid diet.      She was seen by the nutritionist here in the hospital.  She was taken off      the PCA on postoperative day #2.  She was experiencing a fair amount of       incisional pain.  Postoperative day #2, she was tolerating a liquid diet      without any difficulty. She had realized that the PCA morphine was giving      her a lot of nausea. So, the PCA was discontinued.  Her Toradol and Lortab      were controlling her pain fairly well. She was able to ambulate in the      hallways about 20 to 40  feet.  She was never complaining of any shortness      of breath.  Her Lovenox which was given to her postoperatively for      anticoagulation was continued  for discharge to home.  She will be      discharged home today on postoperative day #2 on subcutaneous Lovenox.  I      have asked her to take the Lovenox for a month after surgery, twice a day,      subcutaneous injections. She will see me in the office in 7-10 days.  She      will be discharged home on bariatric liquid diet and the priorities of      fluids, followed by protein supplements, followed by vitamin and mineral      supplements were all discussed with her.  She understood and wished to      proceed with the above-mentioned plan.  Her postoperative vital signs have      been within normal limits.  She was never tachycardiac here in the hospital      and has been tolerating liquid diet without any difficulty, without any      nausea or vomiting and having no bowel movements.  Her hematocrit and other      lab results were all within the expected postoperative normal limits.                                          Electronic Signing MD: Delorse Lek, MD            OZH:[id number]      Dictated:        07/25/2005  4:13 P      Transcribed:     07/26/2005 10:11 A      Job Number:     [id number]      Document Number: [id number]            CC:  Rosary Lively, MD           Delorse Lek, MD

## 2012-10-16 NOTE — Op Note (Signed)
MRN: [id number] DOCUMENT ID: [id number]      INTRODUCTION:      61 YEAR OLD FEMALE PATIENT PRESENTS FOR AN ELECTIVE OUTPATIENT EGD.  THE      INDICATION FOR THE PROCEDURE WAS ANASTOMOTIC STRICTURE POST GASTRIC      BYPASS.      CONSENT:      THE BENEFITS, RISKS, AND ALTERNATIVES TO THE PROCEDURE WERE DISCUSSED AND      INFORMED CONSENT WAS OBTAINED.      PREPARATION:      EKG, PULSE, PULSE OXIMETRY AND BLOOD PRESSURE MONITORED.      MEDICATIONS:      - MAC ANESTHESIA WAS ADMINISTERED DURING THE PROCEDURE      PROCEDURE:      THE ENDOSCOPE WAS PASSED.  THE STUDY WAS PERFORMED WITH A GIF 160.      FINDINGS:  THE SCOPE WAS ADVANCED TO THE GASTRIC POUCH.  THE PREVIOUSLY      NOTED ANASTOMOTIC STRICTURE WAS NOTED.  IT WAS MORE PATENT THAN BEFORE BUT      SCOPE COULD NOT BE PASSED TO THE SMALL BOWEL.  USING THE TTS-CRE BALLOONS,      THE STRICTURE WAS DILATED FROM 12 TO 15 MM.  SUBSEQUENTLY, THE SCOPE WAS      ADVANCED TO BOTH LIMBS OF THE SMALL BOWEL WHICH WERE NORMAL.  A PARTIAL      SCHATZKI'S RING WAS NOTED IN THE DISTAL ESOPHAGUS.      IMPRESSION:      1.  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  PATENT LIMBS OF SMALL BOWEL      3.  PARTIAL SCHATZKI'S RING IN THE DISTAL ESOPHAGUS.  (UNCODED).      RECOMMENDATION:      - CONTINUE CURRENT MEDICATIONS      - ADVANCED DIET      - CALL IF SYMPTOMS RECURR.      SIGNING PHYSICIAN: Barbaraann Cao

## 2012-10-16 NOTE — Op Note (Signed)
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            POSTOPERATIVE DIAGNOSIS:  History of deep vein thrombosis with inferior      vena cava filter placement.            OPERATION:      1.   Venacavogram.      2.   Removal of inferior vena cava filter.            ANESTHESIA:  Local with sedation.            BRIEF HISTORY:  This is a 62 year old female with history of deep vein      thrombosis, who underwent gastric bypass surgery.  Patient had undergone      insertion of a temporary IVC filter preoperatively for a perioperative      time.  Now, it has been about a month postoperatively, and she has been      scheduled for removal of the filter once risks, benefits, and complications      were quoted and agreed.            PROCEDURE:  Patient was brought to the operating suite, was placed supine      position. Sedation was administered.  A g of Ancef was given intravenously.      Right groin was prepped and draped in sterile fashion.  Local anesthesia      was administered.  Right femoral vein was cannulated without any      difficulty.  An 0.035 wire was advanced under fluoroscopy and appeared to      be in the inferior vena cava.  An 8-French sheath was inserted, and a      venacavogram was obtained which showed no evidence of gross thrombosis in      the Vicryl filter.  Therefore, a Tulip snare was used, and the hook was      secured, and then the 8-French sheath was advanced over the filter.  Then      filter was removed.  Pressure was applied for about 10 minutes _____ of      bleeding.  Skin was closed using Indermil, and then pressure dressings were      applied.  Patient tolerated procedure well and was transferred to recovery      room.  Electronic Signing MD: Fransico Setters, MD            [id number]      Dictated:    08/19/2005  9:28 A      Transcribed: 08/19/2005  2:48 P      Job Number: [id number]      Document Number: [id number]            CC:  Fransico Setters, MD

## 2012-10-16 NOTE — Op Note (Signed)
MRN: [id number] DOCUMENT ID: [id number]      INTRODUCTION:      62 YEAR OLD FEMALE PATIENT PRESENTS FOR AN ELECTIVE OUTPATIENT      COLONOSCOPY.  THE INDICATIONS FOR THE PROCEDURE WERE FOLLOW-UP OF      ULCERATIVE COLITIS AND SCREENING FOR COLONIC POLYPS AND COLON CANCER.      CONSENT:      THE BENEFITS, RISKS, AND ALTERNATIVES TO THE PROCEDURE WERE DISCUSSED AND      INFORMED CONSENT WAS OBTAINED.      PREPARATION:      EKG, PULSE, PULSE OXIMETRY AND BLOOD PRESSURE MONITORED.      MEDICATIONS:      - MAC ANESTHESIA WAS ADMINISTERED THROUGHOUT THE PROCEDURE      PROCEDURE:      RECTAL EXAM: NO FISSURES.  NO FISTULA.  NO MASSES.      THE ENDOSCOPE WAS PASSED WITHOUT DIFFICULTY UNDER DIRECT VISUALIZATION TO      THE CECUM CONFIRMED BY LANDMARKS AND PHOTOGRAPH NUMBER 5 AND 7.  THE      QUALITY OF THE PREPARATION WAS GOOD.  THE STUDY WAS PERFORMED WITH A CFQ      180 AL.      FINDINGS:  THERE WAS EVIDENCE OF NON-SPECIFIC COLITIS OF THE DISTAL      RECTUM.  THE MUCOSA APPEARED ERYTHEMATOUS.  REFER TO PHOTOGRAPH NUMBER:      12.  MULTIPLE COLD BIOPSIES WERE OBTAINED FROM THE RECTUM.  THE MUCOSA OF      THE REMAINDER OF THE COLON APPEARED ENTIRELY NORMAL, WITHOUT EVIDENCE OF      COLITIS.  MULTIPLE COLD BIOPSIES WERE OBTAINED FROM THE CECUM, THE HEPATIC      FLEXURE, THE SPLENIC FLEXURE AND THE SIGMOID.  THERE WERE NO MASSES OR      POLYPS FOUND.  THERE WERE MULTIPLE MEDIUM SCATTERED DIVERTICULA PRESENT IN      THE COLON.      COMPLICATIONS:      THERE WERE NO COMPLICATIONS ASSOCIATED WITH THE PROCEDURE.      IMPRESSION:      1.  NON-SPECIFIC COLITIS OF THE DISTAL RECTUM.  MULTIPLE COLD BIOPSIES      WERE OBTAINED FROM THE RECTUM.      2.  THE MUCOSA OF THE REMAINDER OF THE COLON APPEARED ENTIRELY NORMAL,      WITHOUT EVIDENCE OF COLITIS.  MULTIPLE COLD BIOPSIES WERE OBTAINED FROM      THE CECUM, THE HEPATIC FLEXURE, THE SPLENIC FLEXURE AND THE SIGMOID.      3.  THERE WERE NO MASSES OR POLYPS FOUND.      4.  MULTIPLE MEDIUM  SCATTERED DIVERTICULA IN THE COLON. [562.10].      RECOMMENDATION:      - FOLLOW-UP ON THE RESULTS OF BIOPSY SPECIMENS IN 2 TO 3 WEEKS.      - FOLLOW-UP WITH AMIR H MOAZZEZ AS SCHEDULED.      - REPEAT COLONOSCOPY IN 5 YEARS, DEPENDING UPON THE BIOPSY RESULTS.      SIGNING PHYSICIAN: Jasper Riling

## 2012-10-16 NOTE — Op Note (Signed)
ROOM NUMBER:                           [id number]            SURGEON:                                Delorse Lek, MD            DATE:                                   07/23/2005                  ASSISTANT:  Bo Mcclintock, PA.            PREOPERATIVE DIAGNOSIS:  Morbid obesity, history of pulmonary embolism,      _____ mitral valve prolapse, gastroesophageal reflux disease, sleep apnea.            POSTOPERATIVE DIAGNOSIS:  Morbid obesity, history of pulmonary embolism,      _____ mitral valve prolapse, gastroesophageal reflux disease, sleep apnea.            OPERATION:  Laparoscopic Roux-en-Y gastric bypass surgery.            COMPLICATIONS:  None.            ESTIMATED BLOOD LOSS:  Minimal.            FINDINGS:  Very thick omentum, short mesentery.            COMPLICATIONS:  None.            ESTIMATED BLOOD LOSS:  Minimal.            DISPOSITION:  Extubated to PACU.            ANESTHESIA:  General endotracheal anesthesia.            DISPOSITION:  Extubated to PACU.            ESTIMATED BLOOD LOSS:  Minimal.            COUNTS:  Needle, instrument, and sponge count was correct at the end of the      case.            PROCEDURE:  The patient is a very pleasant 62 year old lady with BMI of 42,      who has failed multiple previous attempts at conservative weight loss.  She      had opted to proceed with laparoscopic Roux-en-Y gastric bypass surgery as      her surgical weight loss option.  She underwent an excessive preoperative      workup and education.  She had an excessive preoperative workup and      received preoperative removable IVC filter placement and cardiac clearance      in addition to finding that she has sleep apnea and CPAP was used by her      preoperatively.  She also has mitral valve prolapse for which she was      prophylaxed preoperatively.  The risks of the operation including, but not      limited to bleeding, infection, staple line leak, deep venous thrombosis,      repeat pulmonary  embolism, death, dumping syndrome, bowel obstruction,      heart attack, kidney failure,  unforeseen conditions were all discussed with      her in detail, and she understood and wished to proceed.  For more detailed      consent, please refer to the office chart.  The patient was admitted      through the preoperative holding area on July 23, 2005, after undergoing      a preoperative bowel prep at home.  She was given preoperative antibiotics      including the ampicillin and gentamicin.  She was also given subcutaneous      heparin in addition to compression stockings.  She was taken to the      operating room.  IV fluids was given in the holding area.  She was placed      in supine position.  General endotracheal anesthesia was instituted.  A      Foley catheter was inserted.  Foot board was placed.  Pressure points were      padded.  The abdomen was prepped and draped using 2 types of prep to      minimize risks of infection using the alcohol and chlorhexidine.  An      incision was made in the supraumbilical region just to the left of the      midline through which a #12-mm Optiview trocar was placed under direct      vision into peritoneal cavity.  This was followed by a 15-mm trocar in the      epigastric region.  Another 12-mm trocar just at the paramedian region to      the left of the previously placed supraumbilical trocar.  Patient was noted      to have several adhesions between the omentum and lateral abdominal wall.      These adhesions were taken down using sharp dissection.  The omentum which      was very thick was reflected above the transverse colon.  Ligament of      Treitz was identified, and it was measured up to 70 cm at which point the      jejunum was divided using a white load Endo GIA.  The Roux limb was marked,      and the Roux limb was measured up to 120 cm at which point the bowel was      grasped.  Two separate enterotomies were performed on the distal aspect of      the biliary  pancreatic limb and distal aspect of the Roux limb.  Through      these separate enterotomies, a side-to-side jejunojejunostomy was      performed, and the common enterotomy was closed using multiple interrupted      figure-of-eight Surgidac alternating with #2-0 silk sutures.  After the      closing common enterotomy, the mesentery defect was closed using a running      #2-0 Surgidac suture in a running locking fashion.  The Roux limb was      reflected to the left side of the abdomen.  The omentum which was very      thick was divided to minimize the tension on the gastrojejunostomy.  The      omentum was draped on the both sides of the Roux limb.  The patient was      placed in reverse Trendelenburg position.  A Nathanson liver retractor was      placed through a separate stab wound in the epigastric region.  The  Nathanson liver retractor was used to retract the left lobe of the liver.      The attachments between the fundus and the diaphragm at the angle of HIS      was taken down using blunt dissection.  The avascular plane of the      gastrohepatic ligament was divided.  The lesser sac which was adhesed      posteriorly to the anterior aspect of the pancreas was noted.  These      adhesions were taken down using blunt dissection.  The dissection was      continued on the posterior aspect of the stomach.  The lesser sac was      entered, and the lesser omentum was divided at about 5 cm from the GE      junction.  The stomach was divided in the same direction transversely using      the green load Endo GIA.  The pouch was partially made by firing a 45-mm      Endo GIA towards the angle of HIS.  Prior to completing a pouch, a      gastrostomy was made through which a #25-mm anvil for a 25-mm stapler was      introduced into the pouch.  The shaft of the anvil was brought up through      the staple line.  The pouch was then completed towards the angle of HIS by      firing a #60 blue load Endo GIA, followed by  a #45 blue load Endo GIA over      a seam guard.  The last firing had a seam guard on it for hemostasis.  The      gastrostomy was then closed using a blue load Endo GIA with a seam guard.      The staple lines at both the pouch and the remnant stomach were clipped for      hemostasis.  The bowel continuity was then restored by performing an      end-to-side anastomosis between the pouch and the proximal Roux limb.  The      anastomosis was performed in an antecolic antegastric fashion by      introducing a #25-mm stapler through the blind end of the Roux limb and      proceeding with the anastomosis in an end-to-side fashion.  The blind end      of the Roux limb was then closed using a white load Endo GIA over a seam      guard.  The bowel clamp was placed just distal to the gastrojejunostomy.      An NG tube was introduced under direct vision into the pouch, and blue dye      was used to distend the pouch and the proximal Roux limb.  No extravasation      of the blue dye was noted through the staple line on the pouch, or through      the gastrojejunostomy, or through the blind end of the Roux limb.  Prior to      doing the leak test, 3 sutures were placed circumferentially around the      gastrojejunostomy to imbricate the gastrojejunostomy staple line.  The      fascia at the supraumbilical trocar site was closed using a #1 PDS suture      using the Carter-Thomason fascial closure device.  A #15 round Blake drain      was placed around the  gastrojejunostomy.  The gastrojejunostomy was covered      with the omentum.  The [facility] liver retractor was removed under direct      vision.  Then was then completely desufflated.  None of the trocars had any      bleeding, and no herniation of the bowel or omentum was noted through any      of the trocar sites.  The fascia at the epigastric trocar site was closed      using #1 PDS suture in a figure-of-eight fashion.  Subcutaneous tissue was      irrigated.  The skin was  approximated using skin clips.  The drain which      was brought up through the left upper quadrant trocar site was sutured to      the skin using 2-0 nylon suture.                                          Electronic Signing MD: Delorse Lek, MD            [id number]      Dictated:    07/23/2005 10:15 A      Transcribed: 07/23/2005 12:24 P      Job Number: [id number]      Document Number: [id number]            CC:  Rosary Lively, MD           Delorse Lek, MD

## 2012-10-16 NOTE — Op Note (Signed)
MRN: [id number] DOCUMENT ID: [id number]      INTRODUCTION:      62 YEAR OLD FEMALE PATIENT PRESENTS FOR AN ELECTIVE OUTPATIENT EGD.  THE      INDICATION FOR THE PROCEDURE WAS DYSPEPSIA.      CONSENT:      THE BENEFITS, RISKS, AND ALTERNATIVES TO THE PROCEDURE WERE DISCUSSED AND      INFORMED CONSENT WAS OBTAINED.      PREPARATION:      EKG, PULSE, PULSE OXIMETRY AND BLOOD PRESSURE MONITORED.      MEDICATIONS:      - MAC ANESTHESIA WAS ADMINISTERED THROUGHOUT THE PROCEDURE      PROCEDURE:      THE ENDOSCOPE WAS PASSED WITHOUT DIFFICULTY UNDER DIRECT VISUALIZATION TO      THE 3RD PORTION OF THE DUODENUM.  RETROFLEXION WAS PERFORMED IN THE      FUNDUS.  THE STUDY WAS PERFORMED WITH A GIF 160.      FINDINGS:      HYPOPHARYNX: THERE WAS NO EVIDENCE OF MUCOSAL ERYTHEMA, EXUDATE, POLYPS OR      CANDIDIASIS.      ESOPHAGUS: THERE WAS NO EVIDENCE OF ESOPHAGITIS, BARRETT'S ESOPHAGUS,      CANDIDIASIS, EROSION, ESOPHAGEAL ULCER OR ESOPHAGEAL STRICTURE.      GE-JUNCTION: THERE WAS NO EVIDENCE OF HIATAL HERNIA, ESOPHAGEAL STRICTURE,      ESOPHAGEAL STENOSIS, LOWER ESOPHAGEAL RING OR ESOPHAGEAL OBSTRUCTION.      MULTIPLE COLD BIOPSIES WERE OBTAINED FROM THE GASTROESOPHAGEAL JUNCTION.      STOMACH: THERE WAS NO EVIDENCE OF ABNORMAL FOLDS, GASTRIC BLEEDING,      DEFORMITY, EROSION, GASTRIC ULCER OR GASTRIC MASS.  MULTIPLE COLD BIOPSIES      WERE OBTAINED FROM THE ANTRUM AND THE FUNDUS.  A FEW COLD H. PYLORI      BIOPSIES WERE OBTAINED FROM THE ANTRUM AND PLACED IN CLO TEST AGAR.      PYLORUS: THE PYLORUS APPEARED NORMAL.      DUODENUM: THERE WAS NO EVIDENCE OF ABNORMAL FOLDS, DUODENAL ULCER OR      VASCULAR ABNORMALITY.  MULTIPLE COLD BIOPSIES WERE OBTAINED FROM THE 3RD      PORTION OF THE DUODENUM.      COMPLICATIONS:      THERE WERE NO COMPLICATIONS ASSOCIATED WITH THE PROCEDURE.      IMPRESSION:      1.  THERE WAS NO EVIDENCE OF MUCOSAL ERYTHEMA, EXUDATE, POLYPS OR      CANDIDIASIS.      2.  THERE WAS NO EVIDENCE OF ESOPHAGITIS,  BARRETT'S ESOPHAGUS,      CANDIDIASIS, EROSION, ESOPHAGEAL ULCER OR ESOPHAGEAL STRICTURE.      3.  THERE WAS NO EVIDENCE OF HIATAL HERNIA, ESOPHAGEAL STRICTURE,      ESOPHAGEAL STENOSIS, LOWER ESOPHAGEAL RING OR ESOPHAGEAL OBSTRUCTION.      MULTIPLE COLD BIOPSIES WERE OBTAINED FROM THE GASTROESOPHAGEAL JUNCTION.      4.  THERE WAS NO EVIDENCE OF ABNORMAL FOLDS, GASTRIC BLEEDING, DEFORMITY,      EROSION, GASTRIC ULCER OR GASTRIC MASS.  MULTIPLE COLD BIOPSIES WERE      OBTAINED FROM THE ANTRUM AND THE FUNDUS. A FEW COLD H. PYLORI BIOPSIES      WERE OBTAINED FROM THE ANTRUM AND PLACED IN CLO TEST AGAR.      5.  THE PYLORUS APPEARED NORMAL.      6.  THERE WAS NO EVIDENCE OF ABNORMAL FOLDS, DUODENAL ULCER OR VASCULAR      ABNORMALITY.  MULTIPLE COLD  BIOPSIES WERE OBTAINED FROM THE 3RD PORTION OF      THE DUODENUM.      RECOMMENDATION:      - FOLLOW-UP ON THE RESULTS OF BIOPSY SPECIMENS.      - INSTITUTE A COURSE OF H. PYLORI TREATMENT WITH A STANDARD DRUG REGIMEN.      - FOLLOW-UP WITH AMIR H MOAZZEZ AS SCHEDULED.      SIGNING PHYSICIAN: Jasper Riling

## 2013-04-19 ENCOUNTER — Other Ambulatory Visit: Payer: Self-pay | Admitting: Surgery

## 2013-04-19 DIAGNOSIS — IMO0001 Reserved for inherently not codable concepts without codable children: Secondary | ICD-10-CM

## 2013-04-19 DIAGNOSIS — R1110 Vomiting, unspecified: Secondary | ICD-10-CM

## 2013-04-23 ENCOUNTER — Ambulatory Visit
Admission: RE | Admit: 2013-04-23 | Discharge: 2013-04-23 | Disposition: A | Discharge status: Home / Self Care | Payer: Medicare Other | Source: Ambulatory Visit | Attending: Surgery | Admitting: Surgery

## 2013-04-23 DIAGNOSIS — IMO0001 Reserved for inherently not codable concepts without codable children: Secondary | ICD-10-CM

## 2013-04-23 DIAGNOSIS — R1110 Vomiting, unspecified: Secondary | ICD-10-CM | POA: Insufficient documentation

## 2013-04-23 DIAGNOSIS — K219 Gastro-esophageal reflux disease without esophagitis: Secondary | ICD-10-CM | POA: Insufficient documentation

## 2013-04-23 MED ORDER — BARIUM SULFATE 96 % PO SUSR
150.0000 mL | Freq: Once | ORAL | Status: AC | PRN
Start: 2013-04-23 — End: 2013-04-23
  Administered 2013-04-23: 150 mL via ORAL

## 2013-07-14 ENCOUNTER — Inpatient Hospital Stay
Admission: RE | Admit: 2013-07-14 | Discharge: 2013-07-14 | Disposition: A | Discharge status: Home / Self Care | Payer: Medicare Other | Source: Ambulatory Visit | Attending: Gastroenterology | Admitting: Gastroenterology

## 2013-07-14 ENCOUNTER — Ambulatory Visit: Payer: Self-pay

## 2013-07-14 DIAGNOSIS — K209 Esophagitis, unspecified without bleeding: Secondary | ICD-10-CM | POA: Insufficient documentation

## 2013-07-14 DIAGNOSIS — K219 Gastro-esophageal reflux disease without esophagitis: Secondary | ICD-10-CM | POA: Insufficient documentation

## 2013-07-14 DIAGNOSIS — K5190 Ulcerative colitis, unspecified, without complications: Secondary | ICD-10-CM | POA: Insufficient documentation

## 2014-04-01 ENCOUNTER — Encounter (INDEPENDENT_AMBULATORY_CARE_PROVIDER_SITE_OTHER): Payer: Self-pay | Admitting: Family

## 2014-06-17 ENCOUNTER — Inpatient Hospital Stay
Payer: Medicare Other | Attending: Rehabilitative and Restorative Service Providers" | Admitting: Rehabilitative and Restorative Service Providers"

## 2014-06-17 DIAGNOSIS — M25511 Pain in right shoulder: Secondary | ICD-10-CM

## 2014-06-17 NOTE — PT/OT Therapy Note (Addendum)
After subjective discussion with patient it was determined that therapy will be postponed until visit with orthopedic surgeon.  Discharged.    Lorelle Formosa. [place], [place]  [id number]

## 2014-06-22 ENCOUNTER — Ambulatory Visit: Payer: Medicare Other | Admitting: Rehabilitative and Restorative Service Providers"

## 2014-07-01 ENCOUNTER — Ambulatory Visit: Payer: Medicare Other | Admitting: Physical Therapist

## 2014-07-06 ENCOUNTER — Ambulatory Visit: Payer: Medicare Other | Admitting: Physical Therapist

## 2014-07-08 ENCOUNTER — Ambulatory Visit: Payer: Medicare Other | Admitting: Physical Therapist

## 2014-07-12 ENCOUNTER — Ambulatory Visit: Payer: Medicare Other | Admitting: Rehabilitative and Restorative Service Providers"

## 2014-07-20 ENCOUNTER — Ambulatory Visit: Payer: Self-pay

## 2014-07-20 ENCOUNTER — Encounter (FREE_STANDING_LABORATORY_FACILITY): Payer: Medicare Other

## 2014-07-20 DIAGNOSIS — K2950 Unspecified chronic gastritis without bleeding: Secondary | ICD-10-CM

## 2014-07-20 DIAGNOSIS — K51219 Ulcerative (chronic) proctitis with unspecified complications: Secondary | ICD-10-CM

## 2014-07-20 DIAGNOSIS — K51919 Ulcerative colitis, unspecified with unspecified complications: Secondary | ICD-10-CM

## 2014-12-08 ENCOUNTER — Ambulatory Visit
Admission: RE | Admit: 2014-12-08 | Discharge: 2014-12-08 | Disposition: A | Discharge status: Home / Self Care | Payer: Medicare Other | Source: Ambulatory Visit | Attending: Medical Oncology | Admitting: Medical Oncology

## 2014-12-08 DIAGNOSIS — D509 Iron deficiency anemia, unspecified: Secondary | ICD-10-CM | POA: Insufficient documentation

## 2014-12-08 LAB — CBC AND DIFFERENTIAL
Basophils Absolute Automated: 0.05 10*3/uL (ref 0.00–0.20)
Basophils Automated: 0 %
Eosinophils Absolute Automated: 0.25 10*3/uL (ref 0.00–0.70)
Eosinophils Automated: 2 %
Hematocrit: 36.6 % — ABNORMAL LOW (ref 37.0–47.0)
Hgb: 11.4 g/dL — ABNORMAL LOW (ref 12.0–16.0)
Immature Granulocytes Absolute: 0.04 10*3/uL
Immature Granulocytes: 0 %
Lymphocytes Absolute Automated: 2.69 10*3/uL (ref 0.50–4.40)
Lymphocytes Automated: 24 %
MCH: 30.6 pg (ref 28.0–32.0)
MCHC: 31.1 g/dL — ABNORMAL LOW (ref 32.0–36.0)
MCV: 98.1 fL (ref 80.0–100.0)
MPV: 10.8 fL (ref 9.4–12.3)
Monocytes Absolute Automated: 0.59 10*3/uL (ref 0.00–1.20)
Monocytes: 5 %
Neutrophils Absolute: 7.68 10*3/uL (ref 1.80–8.10)
Neutrophils: 68 %
Platelets: 301 10*3/uL (ref 140–400)
RBC: 3.73 10*6/uL — ABNORMAL LOW (ref 4.20–5.40)
RDW: 13 % (ref 12–15)
WBC: 11.26 10*3/uL — ABNORMAL HIGH (ref 3.50–10.80)

## 2014-12-08 LAB — FERRITIN: Ferritin: 307.65 ng/mL — ABNORMAL HIGH (ref 4.60–204.00)

## 2014-12-08 LAB — COMPREHENSIVE METABOLIC PANEL
ALT: 20 U/L (ref 0–55)
AST (SGOT): 22 U/L (ref 5–34)
Albumin/Globulin Ratio: 1.3 (ref 0.9–2.2)
Albumin: 3.5 g/dL (ref 3.5–5.0)
Alkaline Phosphatase: 131 U/L — ABNORMAL HIGH (ref 37–106)
Anion Gap: 9 (ref 5.0–15.0)
BUN: 13.5 mg/dL (ref 7.0–19.0)
Bilirubin, Total: 0.4 mg/dL (ref 0.2–1.2)
CO2: 23 mEq/L (ref 22–29)
Calcium: 9 mg/dL (ref 8.5–10.5)
Chloride: 108 mEq/L (ref 100–111)
Creatinine: 0.9 mg/dL (ref 0.6–1.0)
Globulin: 2.7 g/dL (ref 2.0–3.6)
Glucose: 93 mg/dL (ref 70–100)
Potassium: 4.8 mEq/L (ref 3.5–5.1)
Protein, Total: 6.2 g/dL (ref 6.0–8.3)
Sodium: 140 mEq/L (ref 136–145)

## 2014-12-08 LAB — IRON PROFILE
Iron Saturation: 23 % (ref 15–50)
Iron: 60 ug/dL (ref 40–145)
TIBC: 266 ug/dL (ref 265–497)
UIBC: 206 ug/dL (ref 126–382)

## 2014-12-08 LAB — FOLATE: Folate: 39.3 ng/mL

## 2014-12-08 LAB — RETICULOCYTES
Immature Platelet Fraction: 2.6 % (ref 0.9–11.2)
Immature Retic Fract: 15 % (ref 3.0–15.9)
Reticulocyte Count Absolute: 0.112 10*6/uL (ref 0.0210–0.1350)
Reticulocyte Count Automated: 3 % — ABNORMAL HIGH (ref 0.5–2.5)
Reticulocyte Hemoglobin: 36.2 pg (ref 28.2–36.6)

## 2014-12-08 LAB — GFR: EGFR: >60

## 2014-12-08 LAB — VITAMIN B12: Vitamin B-12: >2000 pg/mL — ABNORMAL HIGH (ref 211–911)

## 2014-12-08 LAB — HEMOLYSIS INDEX: Hemolysis Index: 3 (ref 0–18)

## 2014-12-20 ENCOUNTER — Other Ambulatory Visit (INDEPENDENT_AMBULATORY_CARE_PROVIDER_SITE_OTHER): Payer: Self-pay

## 2015-08-30 ENCOUNTER — Ambulatory Visit: Payer: Self-pay

## 2015-08-30 ENCOUNTER — Ambulatory Visit
Admission: RE | Admit: 2015-08-30 | Discharge: 2015-08-30 | Disposition: A | Discharge status: Home / Self Care | Payer: Medicare Other | Source: Ambulatory Visit | Attending: Gastroenterology | Admitting: Gastroenterology

## 2015-08-30 DIAGNOSIS — Z9884 Bariatric surgery status: Secondary | ICD-10-CM | POA: Insufficient documentation

## 2015-08-30 DIAGNOSIS — K2970 Gastritis, unspecified, without bleeding: Secondary | ICD-10-CM | POA: Insufficient documentation

## 2015-08-30 DIAGNOSIS — K5190 Ulcerative colitis, unspecified, without complications: Secondary | ICD-10-CM | POA: Insufficient documentation

## 2015-08-31 LAB — LAB USE ONLY - HISTORICAL SURGICAL PATHOLOGY

## 2016-02-28 ENCOUNTER — Ambulatory Visit: Payer: Medicare Other | Attending: Orthopaedic Surgery

## 2016-02-28 DIAGNOSIS — M542 Cervicalgia: Secondary | ICD-10-CM | POA: Insufficient documentation

## 2016-02-28 DIAGNOSIS — M6281 Muscle weakness (generalized): Secondary | ICD-10-CM | POA: Insufficient documentation

## 2016-02-28 DIAGNOSIS — R29898 Other symptoms and signs involving the musculoskeletal system: Secondary | ICD-10-CM

## 2016-02-28 DIAGNOSIS — M25511 Pain in right shoulder: Secondary | ICD-10-CM | POA: Insufficient documentation

## 2016-02-28 DIAGNOSIS — Z96611 Presence of right artificial shoulder joint: Secondary | ICD-10-CM

## 2016-02-28 DIAGNOSIS — M25611 Stiffness of right shoulder, not elsewhere classified: Secondary | ICD-10-CM | POA: Insufficient documentation
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
## 2016-03-04 ENCOUNTER — Ambulatory Visit: Payer: Medicare Other

## 2016-03-04 DIAGNOSIS — M25619 Stiffness of unspecified shoulder, not elsewhere classified: Secondary | ICD-10-CM

## 2016-03-04 DIAGNOSIS — M25511 Pain in right shoulder: Secondary | ICD-10-CM

## 2016-03-04 DIAGNOSIS — R29898 Other symptoms and signs involving the musculoskeletal system: Secondary | ICD-10-CM

## 2016-03-04 DIAGNOSIS — Z96611 Presence of right artificial shoulder joint: Secondary | ICD-10-CM

## 2016-03-04 NOTE — Progress Notes (Signed)
[facility]  [address], [address]  [place], [place]  [address]  Phone:  [phone]  Fax:  [phone]    PHYSICAL THERAPY DAILY TREATMENT NOTE    PATIENT: Leslie Gross DOB: 14-Nov-1950   MR #: [id number]  AGE: 65 y.o.    FACILITY PROVIDER #: [id number] PRIMARY MD: Lana Fish, MD    HICN# Medicare Sub. Num: [id number] A DIAGNOSES: Presence of right artificial shoulder joint [Z96.611]      Date of Service PT Received On: 03/04/16   Treatment Time Start Time: 1305 to Stop Time: 1400   Time Calculation Time Calculation (min): 55 min   Visit #  2   Units Billed   Therapeutic Interventions  $ PT Electrical Stimulation- Unattended (Q5956): 1 Procedure  $ PT Therapeutic Exercise (97110): 1 Unit  $ PT Manual Therapy (38756): 2 Unit     Leslie Gross referred for physical therapy services by: Diona Fanti, MD    Certification period, precautions, medications and allergies and goals copied from initial evaluation - reviewed and reconciled today.  Leslie Gross, PT 03/04/2016   Certification Period:  02/28/16 to 05/30/16    Treatment Diagnosis:  Neck Pain [M54.2], Decreased Range of Motion in shoulder [M25.611], Acute shoulder Pain M25.511] and shoulder weakness M62.81    Date of onset: > 2 years  Date of surgery: 02/12/16  Imaging Performed: Yes--result At surgeon's office.    Precautions: Reverse total shoulder replacement protocol. No ER, extension stretch or strengthening until OK by MD.   Medications listed in EMR:  has a current medication list which includes the following prescription(s): amitriptyline, calcium citrate-vitamin d, carvedilol, cyanocobalamin, cyclosporine, diphenhydramine-acetaminophen, ergocalciferol, lactobacillus, levothyroxine, lisinopril, mesalamine, multivitamin, and pantoprazole. (see scanned list in chart for detailed list)  Patient/Caregiver does not report changes to medication at this time.  Allergies: shellfish-derived  products, diflucan, tape (blisters)    Goals:  Short Term Goals: To be met by 6 weeks  1 Pt will be 100% compliant w/ HEP to increase ROM, strength.  2 Increase cervical rot'n to WNL to allow pt to look over shoulder for progressing towards driving.  3 Increase shoulder flexion AROM to at least 120 degrees to progress pt to reach for objects on high shelf/cupboard.  4 Improve SPADI to <75% indicating decreased pain and improved function.  5 Pt will report no > 5/10 w/ ROM.    Long Term Goals: To be met by 12 weeks  6. Pt will report >90% decreased sensation of Rt shoulder dislocation.  7. Pt will have >130 deg Rt shoulder flexion to allow pt to reach into cabinets.  8. Pt will have at least 4/5 strength in Rt shoulder to allow pt to do IADLs w/o difficulty.  9. SPADI score will < 25% indicating decreased pain and improved function.  10. Pt will have no > 2/10 at worst while doing IADLs, ADLs etc.     Working Toward the Above Goals: (copied from last visit): 1st tx session-no significant progress towards goals.  EXAMINATION:  Subjective Report: Pt reports that she took pain meds today but doesn't have much left and thinks she needs to call MD's office for pain meds.  She has been doing the HEP regularly.  Still has clicking and popping in Rt shoulder and feels like it is going to pop out.    Pain:  0 at rest, w/ movement 7-8/10  Objective Findings today:   Supine gross PROM:  flxn 90 deg, abd 70 deg.  INTERVENTION:  Treatment Performed:  MODALITIES  In order to Decrease pain and Increase Extensiblility  ELECTRICAL STIMULATION  IFC to Rt shoulder/scap for 10 minutes with COLD PACK to shoulder (extra insulation)   MOIST HOT PACK 10 min prior to tx.  MANUAL THERAPY : STM to Rt cervical erector spinae, Rt UT, Rt levt scapula; Lt side lying: Rt scap mobs and Rt medial scap STM to decrase pain increase scap mobility and decrease pain.  THERAPEUTIC EXERCISES : prone w/ Rt arm over the mat table edge: codmans cw/ccw,  flx/ext; AAROM: ext to trun, flxn (pain free) w/ TCs to prevent scap hiking.  Supine: PROM flxn, abd, IR/ER; AAROM: flxn, abduction.  Rt levt scap stretch in sitting.  Patient Education:   Patient was educated on prone over bed Rt UE codman's along w/ HEP, Rt levt scap stretch.    Patient did verbalize and demonstrate an understanding of this education.   Home Exercise Program was issued today., was reviewed today.[name]    EVALUATION AND DIAGNOSIS:   Leslie Gross is a 65 y.o. female presents to physical therapy today with dx of s/p Rt reverse total shoulder replacement.Pt took tylenol w/ codeine prior to PT and therefore able to relax and do PROM w/o too much pain.      Impairments in body function and structure:  Decreased PROM  Decreased AROM  Decreased Strength   Pain  Impaired posture  See above review of systems for details.    Activity Limitations / Restricted Participation:  Required assistance with ADL's: difficulty w/ washing hair, dressing   Required asssitance with IADL's: not able to use Rt UE for any activities  Unable to perform light household duties daily: not able to use Rt UE  Not driving due to Rt arm in sling    PLAN:   Patient requires continued skilled intervention in order to decrease pain. and meet above mentioned functional goals.  Focus on Rt shoulder ROM, decreased guarding, neck exercises next visit.    Therapist Signature:    Leslie Gross, PT, MPT [phone]  Outpatient Speciality Rehab  Physical Medicine and Rehabilitation  [facility]  P: [phone]     03/04/2016

## 2016-03-06 ENCOUNTER — Ambulatory Visit: Payer: Medicare Other

## 2016-03-06 DIAGNOSIS — Z96611 Presence of right artificial shoulder joint: Secondary | ICD-10-CM

## 2016-03-06 DIAGNOSIS — M25511 Pain in right shoulder: Secondary | ICD-10-CM

## 2016-03-06 DIAGNOSIS — M25611 Stiffness of right shoulder, not elsewhere classified: Secondary | ICD-10-CM

## 2016-03-06 DIAGNOSIS — R29898 Other symptoms and signs involving the musculoskeletal system: Secondary | ICD-10-CM

## 2016-03-06 NOTE — Progress Notes (Signed)
[facility]  [address], [address]  [place], [place]  [address]  Phone:  [phone]  Fax:  [phone]    PHYSICAL THERAPY DAILY TREATMENT NOTE    PATIENT: Leslie Gross DOB: 31-Jan-1951   MR #: [id number]  AGE: 65 y.o.    FACILITY PROVIDER #: [id number] PRIMARY MD: Lana Fish, MD    HICN# Medicare Sub. 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]      Date of Service Leslie Gross Received On: 03/06/16   Treatment Time Start Time: 1310 to Stop Time: 1410   Time Calculation Time Calculation (min): 60 min   Visit # Leslie Gross Visit  Leslie Gross Visit Number: 3   Units Billed   Therapeutic Interventions  $ Leslie Gross Electrical Stimulation- Unattended (V2536): 1 Procedure  $ Leslie Gross Therapeutic Exercise (97110): 1 Unit  $ Leslie Gross Manual Therapy (97140): 1 Unit  $ Leslie Gross Therapeutic Activity (97530): 1 unit     Leslie Gross referred for physical therapy services by: Diona Fanti, MD    Certification period, precautions, medications and allergies and goals copied from initial evaluation - reviewed and reconciled today.  Leslie Gross, Leslie Gross 03/06/2016  Certification Period:  02/28/16 to 05/30/16    Treatment Diagnosis:  Neck Pain [M54.2], Decreased Range of Motion in shoulder [M25.611], Acute shoulder Pain M25.511] and shoulder weakness M62.81    Date of onset: > 2 years  Date of surgery: 02/12/16  Imaging Performed: Yes--result At surgeon's office.    Precautions: Reverse total shoulder replacement protocol. No ER, extension stretch or strengthening until OK by MD.   Medications listed in EMR:  has a current medication list which includes the following prescription(s): amitriptyline, calcium citrate-vitamin d, carvedilol, cyanocobalamin, cyclosporine, diphenhydramine-acetaminophen, ergocalciferol, lactobacillus, levothyroxine, lisinopril, mesalamine, multivitamin, and pantoprazole. (see scanned list in chart for detailed list)  Patient/Caregiver does not report  changes to medication at this time.  Allergies: shellfish-derived products, diflucan, tape (blisters)    Goals:  Short Term Goals: To be met by 6 weeks  1 Leslie Gross will be 100% compliant w/ HEP to increase ROM, strength.  2 Increase cervical rot'n to WNL to allow Leslie Gross to look over shoulder for progressing towards driving.  3 Increase shoulder flexion AROM to at least 120 degrees to progress Leslie Gross to reach for objects on high shelf/cupboard.  4 Improve SPADI to <75% indicating decreased pain and improved function.  5 Leslie Gross will report no > 5/10 w/ ROM.    Long Term Goals: To be met by 12 weeks  6. Leslie Gross will report >90% decreased sensation of Rt shoulder dislocation.  7. Leslie Gross will have >130 deg Rt shoulder flexion to allow Leslie Gross to reach into cabinets.  8. Leslie Gross will have at least 4/5 strength in Rt shoulder to allow Leslie Gross to do IADLs w/o difficulty.  9. SPADI score will < 25% indicating decreased pain and improved function.  10. Leslie Gross will have no > 2/10 at worst while doing IADLs, ADLs etc.   Working Toward the Above Goals: (copied from last visit):    GOAL# Progress Toward Goals   1 Progressing   2 Progressing     EXAMINATION:  Subjective Report: Leslie Gross reports that she has been doing the pendulum exercises and the HEP from last week.  She got more pain meds from MD and took 1 Tylenol w/ codeine at 12 noon today.    Pain:    5-6 on a scale of 0 - 10,  location: right shoulder w movement    Objective Findings today: supine PROM: flxn: 90 deg, abd: 30 deg, ER to netural    INTERVENTION:  Treatment Performed:  MODALITIES  In order to Decrease pain  ELECTRICAL STIMULATION  IFC to right shoulder for 10 minutes with MHP to shoulder   MANUAL THERAPY : STM to cervical erector spinae, Rt levt scap, Rt UT to decrease tightness  THERAPEUTIC ACTIVITIES  : Leslie Gross education (see below).  Leslie Gross was shown the shoulder model, movement pattern explained, hand out given.--to decrease her fear of Rt shoulder dislocation.  THERAPEUTIC EXERCISES :1:1 per exercise flow sheet  to improve flexibility, ROM, strength, stability, and posture.  Max VCs to promote relxation of Rt UT and levet scap muscles.    Exercise Specifics Date 5/24 Date Date Date Date Date   Supine flxn PROM x10-4        abd PROM x10-4        ER PROM x10-4        Elbow flxn/ext AAROM x10-3                                                                              Patient Education:   Patient was educated on reverse total shoulder precautions, rehab protocol.    Patient did verbalize an understanding of this education.   Home Exercise Program was reviewed today.[name]    EVALUATION AND DIAGNOSIS:   Leslie Gross is a 65 y.o. female presents to physical therapy today s/p total reverse shoulder replacement.   Leslie Gross's fear or dislocation is hindering in her gaining ROM.     Impairments in body function and structure:  Decreased PROM  Decreased AROM  Decreased Strength   Pain  Impaired posture  See above review of systems for details.    Activity Limitations / Restricted Participation:  Required assistance with ADL's: difficulty w/ washing hair, dressing   Required asssitance with IADL's: not able to use Rt UE for any activities  Unable to perform light household duties daily: not able to use Rt UE  Not driving due to Rt arm in sling    PLAN:   Patient requires continued skilled intervention in order to increase patient safety and independence with daily activities., decrease pain. and meet above mentioned functional goals.  Focus on increasing shoulder ROM, neck ROM, decreasing neck and medial scap pain next visit.    Therapist Signature:    Leslie Gross, Leslie Gross, Leslie Gross [phone]  Outpatient Speciality Rehab  Physical Medicine and Rehabilitation  [facility]  P: [phone]     03/06/2016

## 2016-03-08 ENCOUNTER — Ambulatory Visit: Payer: Medicare Other

## 2016-03-12 ENCOUNTER — Ambulatory Visit: Payer: Medicare Other

## 2016-03-12 DIAGNOSIS — Z96611 Presence of right artificial shoulder joint: Secondary | ICD-10-CM

## 2016-03-12 DIAGNOSIS — M25619 Stiffness of unspecified shoulder, not elsewhere classified: Secondary | ICD-10-CM

## 2016-03-12 DIAGNOSIS — M25511 Pain in right shoulder: Secondary | ICD-10-CM

## 2016-03-12 NOTE — Progress Notes (Signed)
[facility]  [address]  [place], [place]  [address]  Phone:  [phone]  Fax:  [phone]    PHYSICAL THERAPY DAILY TREATMENT NOTE    PATIENT: Leslie Gross DOB: 13-Sep-1951   MR #: [id number]  AGE: 65 y.o.    FACILITY PROVIDER #: [id number] PRIMARY MD: Lana Fish, MD    HICN# Medicare Sub. 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  [name], PT 03/12/2016  Certification Period:  02/28/16 to 05/30/16    Treatment Diagnosis:  Neck Pain [M54.2], Decreased Range of Motion in shoulder [M25.611], Acute shoulder Pain M25.511] and shoulder weakness M62.81    Date of onset: > 2 years  Date of surgery: 02/12/16  Imaging Performed: Yes--result At surgeon's office.    Precautions: Reverse total shoulder replacement protocol. No ER, extension stretch or strengthening until OK by MD.   Medications listed in EMR:  has a current medication list which includes the following prescription(s): amitriptyline, calcium citrate-vitamin d, carvedilol, cyanocobalamin, cyclosporine, diphenhydramine-acetaminophen, ergocalciferol, lactobacillus, levothyroxine, lisinopril, mesalamine, multivitamin, and pantoprazole. (see scanned list in chart for detailed list)  Patient/Caregiver does not report changes to medication at this time.  Allergies:  shellfish-derived products, diflucan, tape (blisters)    Goals:  Short Term Goals: To be met by 6 weeks  1 Pt will be 100% compliant w/ HEP to increase ROM, strength.  2 Increase cervical rot'n to WNL to allow pt to look over shoulder for progressing towards driving.  3 Increase shoulder flexion AROM to at least 120 degrees to progress pt to reach for objects on high shelf/cupboard.  4 Improve SPADI to <75% indicating decreased pain and improved function.  5 Pt will report no > 5/10 w/ ROM.    Long Term Goals: To be met by 12 weeks  6. Pt will report >90% decreased sensation of Rt shoulder dislocation.  7. Pt will have >130 deg Rt shoulder flexion to allow pt to reach into cabinets.  8. Pt will have at least 4/5 strength in Rt shoulder to allow pt to do IADLs w/o difficulty.  9. SPADI score will < 25% indicating decreased pain and improved function.  10. Pt will have no > 2/10 at worst while doing IADLs, ADLs etc.   Working Toward the Above Goals: (copied from last visit):    GOAL# Progress Toward Goals   1 Progressing   2 Progressing              EXAMINATION:  Subjective Report: Pt reports that she thinks her shoulder is doing better today.    Pain:      Objective Findings today:  5-6 on a scale of 0 - 10, location: right shoulder w movement    INTERVENTION:  Treatment Performed:  MODALITIES In order  to Decrease pain ELECTRICAL STIMULATION IFC to right shoulder for 10 minutes with MHP to shoulder.  MH to Rt shoulder pre MT and therex.   MANUAL THERAPY : STM to cervical erector spinae, Rt levt scap, Rt UT to decrease tightness.  Rt scap mobs in prone.  Grade I-II oscillations at [facility] in prone w/ Rt arm over the edge of mat.  THERAPEUTIC EXERCISES :1:1 per exercise flow sheet to improve flexibility, ROM, strength, stability, and posture. Max VCs to promote relxation of Rt UT and levet scap       Exercise Specifics Date 5/24 Date  5/30 Date Date Date Date   Supine flxn PROM x10-4 x10-3        abd PROM x10-4 x10-3       ER PROM x10-4 x10-3       Elbow flxn/ext AAROM x10-3 x10-4       Ext to neutral prone - x10-2       pendulum Prone supported - x10-2                                                          Patient Education:   Patient was educated on HEP routine.    Patient did demonstrate an understanding of this education.   Home Exercise Program was reviewed today.[name]    EVALUATION AND DIAGNOSIS:   Leslie Gross is a 65 y.o. female presents to physical therapy todays/p total reverse shoulder replacement. Pt tolerated today's tx w/ less fear and only felt like shoulder dislocating a couple of times.    Impairments in body function and structure:  Decreased PROM  Decreased AROM  Decreased Strength   Pain  Impaired posture  See above review of systems for details.    Activity Limitations / Restricted Participation:  Required assistance with ADL's: difficulty w/ washing hair, dressing   Required asssitance with IADL's: not able to use Rt UE for any activities  Unable to perform light household duties daily: not able to use Rt UE  Not driving due to Rt arm in sling.    PLAN:   Patient requires continued skilled intervention in order to decrease pain. and meet above mentioned functional goals.  Focus on measuring Rt shoulder ROM, progress per protocol next visit.    Therapist Signature:    [name], PT, MPT [phone]  Outpatient Speciality Rehab  Physical Medicine and Rehabilitation  [facility]  P: [phone]     03/12/2016

## 2016-03-13 ENCOUNTER — Ambulatory Visit (INDEPENDENT_AMBULATORY_CARE_PROVIDER_SITE_OTHER): Payer: Self-pay | Admitting: Cardiology

## 2016-03-13 ENCOUNTER — Ambulatory Visit: Payer: Medicare Other

## 2016-03-13 DIAGNOSIS — I340 Nonrheumatic mitral (valve) insufficiency: Secondary | ICD-10-CM | POA: Insufficient documentation

## 2016-03-13 DIAGNOSIS — I429 Cardiomyopathy, unspecified: Secondary | ICD-10-CM | POA: Insufficient documentation

## 2016-03-13 HISTORY — DX: Nonrheumatic mitral (valve) insufficiency: I34.0

## 2016-03-13 HISTORY — DX: Cardiomyopathy, unspecified: I42.9

## 2016-03-15 ENCOUNTER — Ambulatory Visit: Payer: Medicare Other | Attending: Orthopaedic Surgery

## 2016-03-15 DIAGNOSIS — M6281 Muscle weakness (generalized): Secondary | ICD-10-CM | POA: Insufficient documentation

## 2016-03-15 DIAGNOSIS — M25511 Pain in right shoulder: Secondary | ICD-10-CM | POA: Insufficient documentation

## 2016-03-15 DIAGNOSIS — M542 Cervicalgia: Secondary | ICD-10-CM | POA: Insufficient documentation

## 2016-03-15 DIAGNOSIS — M25611 Stiffness of right shoulder, not elsewhere classified: Secondary | ICD-10-CM | POA: Insufficient documentation

## 2016-03-15 DIAGNOSIS — Z96611 Presence of right artificial shoulder joint: Secondary | ICD-10-CM

## 2016-03-15 DIAGNOSIS — M25619 Stiffness of unspecified shoulder, not elsewhere classified: Secondary | ICD-10-CM

## 2016-03-15 NOTE — Progress Notes (Signed)
[facility]  [address], [address]  [place], [place]  [address]  Phone:  [phone]  Fax:  [phone]    PHYSICAL THERAPY DAILY TREATMENT NOTE    PATIENT: Leslie Gross DOB: 08/12/1951   MR #: [id number]  AGE: 65 y.o.    FACILITY PROVIDER #: [id number] PRIMARY MD: Lana Fish, MD    HICN# Medicare Sub. 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  Leslie Gross, PT 03/15/2016  Certification Period:  02/28/16 to 05/30/16    Treatment Diagnosis:  Neck Pain [M54.2], Decreased Range of Motion in shoulder [M25.611], Acute shoulder Pain M25.511] and shoulder weakness M62.81    Date of onset: > 2 years  Date of surgery: 02/12/16  Imaging Performed: Yes--result At surgeon's office.    Precautions: Reverse total shoulder replacement protocol. No ER, extension stretch or strengthening until OK by MD.   Medications listed in EMR:  has a current medication list which includes the following prescription(s): amitriptyline, calcium citrate-vitamin d, carvedilol, cyanocobalamin, cyclosporine, diphenhydramine-acetaminophen, ergocalciferol, lactobacillus, levothyroxine, lisinopril, mesalamine, multivitamin, and pantoprazole. (see scanned list in chart for detailed list)  Patient/Caregiver does not report changes to medication at this time.  Allergies:  shellfish-derived products, diflucan, tape (blisters)    Goals:  Short Term Goals: To be met by 6 weeks  1 Pt will be 100% compliant w/ HEP to increase ROM, strength.  2 Increase cervical rot'n to WNL to allow pt to look over shoulder for progressing towards driving.  3 Increase shoulder flexion AROM to at least 120 degrees to progress pt to reach for objects on high shelf/cupboard.  4 Improve SPADI to <75% indicating decreased pain and improved function.  5 Pt will report no > 5/10 w/ ROM.    Long Term Goals: To be met by 12 weeks  6. Pt will report >90% decreased sensation of Rt shoulder dislocation.  7. Pt will have >130 deg Rt shoulder flexion to allow pt to reach into cabinets.  8. Pt will have at least 4/5 strength in Rt shoulder to allow pt to do IADLs w/o difficulty.  9. SPADI score will < 25% indicating decreased pain and improved function.  10. Pt will have no > 2/10 at worst while doing IADLs, ADLs etc.    Working Toward the Above Goals: (copied from last visit):    GOAL# Progress Toward Goals   1 Progressing   2 Progressing     EXAMINATION:  Subjective Report: Pt reports that she has a hard time w/ getting in/out of bed w/o wt bearing on Rt UE because of where her pillow is.      Pain:    4 on a scale of 0 - 10, location: right shoulder at worst    Objective Findings today:   Supine  RPM: flxn to 100 deg, abd 90 deg, ER to neutral.    INTERVENTION:  Treatment Performed:  MODALITIES  In order to Decrease pain, Decrease spasm and Increase Extensiblility  ELECTRICAL STIMULATION  Pre-Modulated to right shoudler/scapula region for 13 minutes with MHP to neck/shoulder/medial shoulder blade area   MANUAL THERAPY : STM/DTM to Rt cervical erector spinae, Rt UT, Rt levt scap origin/insertion and muscle belly, medial scap muscle to decrease pain, spasm and increase scapular mobility.  Lt side lying Rt scapular mobs in all direction to decrease guarding.  THERAPEUTIC EXERCISES  : 1:1 per exercise flow sheet to  improve flexibility, ROM, strength, stability, posture.   Exercise Specifics Date 5/24 Date  5/30 Date6/2 Date Date Date   Supine flxn PROM x10-4 x10-3 x10-3      abd PROM x10-4 x10-3 x10-3      ER PROM x10-4 x10-3 x10-3      Elbow flxn/ext AAROM x10-3 x10-4 x10-3      Ext to neutral prone - x10-2 x15-2      pendulum Prone supported - x10-2 x15-2      flxn/ext isometric         abd/add isometric         IR/ER isometric                     Patient Education:   Patient was educated on importance of relaxing Rt scapula/shoulder, not loading/wt bearing through Rt UE.    Patient did verbalize an understanding of this education.   Home Exercise Program was not addressed today.[name]    EVALUATION AND DIAGNOSIS:   Leslie Gross is a 65 y.o. female presents to physical therapy today s/p Rt reverse shoulder replacement.  Pt w/ increased PROM of Rt shoulder.  She continues w/ mod tightness of Rt medial scap muscles, Rt levt scap which causes difficulty w/ ROM.  Pt progressing well w/ protocol and will benefit from continued PT txs to improve her shoulder ROM, strength.  Impairments in body function and structure:  Decreased PROM  Decreased AROM  Decreased Strength   Pain  Impaired posture  See above review of systems for details.    Activity Limitations / Restricted Participation:  Required assistance with ADL's: difficulty w/ washing hair, dressing   Required asssitance with IADL's: not able to use Rt UE for any activities  Unable to perform light household duties daily: not able to use Rt UE  Not driving due to Rt arm in sling.  PLAN:   Patient requires continued skilled intervention in order to increase patient safety and independence with daily activities., decrease pain. and meet above mentioned functional goals.  Focus on warm up on pulleys, start  isometric in scapular plane,  next visit.    Therapist Signature:    Leslie Gross, PT, MPT [phone]  Outpatient Speciality Rehab  Physical Medicine and Rehabilitation  [facility]  P: [phone]     03/15/2016

## 2016-03-18 ENCOUNTER — Ambulatory Visit: Payer: Medicare Other

## 2016-03-19 ENCOUNTER — Ambulatory Visit: Payer: Medicare Other

## 2016-03-19 DIAGNOSIS — M25511 Pain in right shoulder: Secondary | ICD-10-CM

## 2016-03-19 DIAGNOSIS — Z96611 Presence of right artificial shoulder joint: Secondary | ICD-10-CM

## 2016-03-19 DIAGNOSIS — R29898 Other symptoms and signs involving the musculoskeletal system: Secondary | ICD-10-CM

## 2016-03-19 DIAGNOSIS — M2560 Stiffness of unspecified joint, not elsewhere classified: Secondary | ICD-10-CM

## 2016-03-19 NOTE — Progress Notes (Signed)
[facility]  [address], [address]  [place], [place]  [address]  Phone:  [phone]  Fax:  [phone]    PHYSICAL THERAPY DAILY TREATMENT NOTE    PATIENT: Leslie Gross DOB: 1950-10-21   MR #: [id number]  AGE: 65 y.o.    FACILITY PROVIDER #: [id number] PRIMARY MD: Lana Fish, MD    HICN# Medicare Sub. 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  [name], PT 03/19/2016  Certification Period:  02/28/16 to 05/30/16    Treatment Diagnosis:  Neck Pain [M54.2], Decreased Range of Motion in shoulder [M25.611], Acute shoulder Pain M25.511] and shoulder weakness M62.81    Date of onset: > 2 years  Date of surgery: 02/12/16  Imaging Performed: Yes--result At surgeon's office.    Precautions: Reverse total shoulder replacement protocol. No ER, extension stretch or strengthening until OK by MD.   Medications listed in EMR:  has a current medication list which includes the following prescription(s): amitriptyline, calcium citrate-vitamin d, carvedilol, cyanocobalamin, cyclosporine, diphenhydramine-acetaminophen, ergocalciferol, lactobacillus, levothyroxine, lisinopril, mesalamine, multivitamin, and pantoprazole. (see scanned list in chart for detailed list)  Patient/Caregiver does not report changes to medication at this time.  Allergies:  shellfish-derived products, diflucan, tape (blisters)    Goals:  Short Term Goals: To be met by 6 weeks  1 Pt will be 100% compliant w/ HEP to increase ROM, strength.  2 Increase cervical rot'n to WNL to allow pt to look over shoulder for progressing towards driving.  3 Increase shoulder flexion AROM to at least 120 degrees to progress pt to reach for objects on high shelf/cupboard.  4 Improve SPADI to <75% indicating decreased pain and improved function.  5 Pt will report no > 5/10 w/ ROM.    Long Term Goals: To be met by 12 weeks  6. Pt will report >90% decreased sensation of Rt shoulder dislocation.  7. Pt will have >130 deg Rt shoulder flexion to allow pt to reach into cabinets.  8. Pt will have at least 4/5 strength in Rt shoulder to allow pt to do IADLs w/o difficulty.  9. SPADI score will < 25% indicating decreased pain and improved function.  10. Pt will have no > 2/10 at worst while doing IADLs, ADLs etc.       Working Toward the Above Goals: (copied from last visit):    GOAL# Progress Toward Goals   1 Met    2 Progressing   EXAMINATION:  Subjective Report: Pt has been sleeping w/o her sling and finds herself slightly on her shoulder and pain wakes her up.  This whole weekend pt has been putting heat over her shoulder because of the pain and taking Tylenol regularly.    Pain:    5-6 on a scale of 0 - 10,  location: right shoulder w/ movement; at rest 3/10.    Objective Findings today: PROM: abd 90 deg, flxn 120 deg, ER to neutral    INTERVENTION:  Treatment Performed:  MODALITIES  In order to Decrease pain and Increase Extensiblility  ELECTRICAL STIMULATION  IFC to medial scap and shoulder for 10 minutes with MHP to shoulder   MOIST HOT PACK  MANUAL THERAPY : STM to Rt UT, Rt medial scap muscles, Lt levt scap insertion.  Prone gentle oscillations of Rt GH to decrease guarding.  THERAPEUTIC EXERCISES : 1:1 per exercise flow sheet to improve flexibility, ROM, strength, stability, posture.   Exercise  Specifics Date 6/6 Date Date Date Date Date   Supine flxn PROM x10-4        Supine abd PROM x10-4        ER PROM  x10-4        Ext to neutral prone x10-2        pendulum Prone cw/ccw x10-2        pendulum Prone abd/add x10-2        pulleys flxn x10-2 (seated)        flx/ext isometric x10        abd/add Isometric  x10        IR/ER isometric x10               Patient Education:   Patient was educated on keeping Rt medial scap muscles relaxed while doing her exercises.    Patient did verbalize an understanding of this education.   Home Exercise Program was reviewed today.[name]    EVALUATION AND DIAGNOSIS:   Leslie Gross is a 65 y.o. female presents to physical therapy today s/p reverse shoulder replacement on Rt.  Pt w/ increased pain today and this may be due to her sleeping on her Rt shoulder.  Pt should keep her sling on and sleep w/ slight inclination to prevent leaning on Rt shoulder while asleep.  Impairments in body function and structure:  Decreased PROM  Decreased AROM  Decreased Strength   Pain  Impaired posture  See above review of systems for details.    Activity Limitations / Restricted Participation:  Required assistance with ADL's: difficulty w/ washing hair, dressing   Required asssitance with IADL's: not able to use Rt UE for any activities  Unable to perform light household duties daily: not able to use Rt UE  Not driving due to Rt arm in sling.    PLAN:   Patient requires continued skilled intervention in order to decrease pain. and meet above mentioned functional goals.  Focus on Rt shoulder ROM, progressing w/ therex per protocol, measure ROM measurement next visit.    Therapist Signature:    [name], PT, MPT [phone]  Outpatient Speciality Rehab  Physical Medicine and Rehabilitation  [facility]  P: [phone]     03/19/2016

## 2016-03-20 ENCOUNTER — Ambulatory Visit: Payer: Medicare Other

## 2016-03-22 ENCOUNTER — Ambulatory Visit: Payer: Medicare Other

## 2016-03-22 DIAGNOSIS — M25611 Stiffness of right shoulder, not elsewhere classified: Secondary | ICD-10-CM

## 2016-03-22 DIAGNOSIS — R29898 Other symptoms and signs involving the musculoskeletal system: Secondary | ICD-10-CM

## 2016-03-22 DIAGNOSIS — M25511 Pain in right shoulder: Secondary | ICD-10-CM

## 2016-03-22 DIAGNOSIS — Z96611 Presence of right artificial shoulder joint: Secondary | ICD-10-CM

## 2016-03-22 NOTE — Progress Notes (Signed)
[facility]  [address], [address]  [place], [place]  [address]  Phone:  [phone]  Fax:  [phone]    PHYSICAL THERAPY DAILY TREATMENT NOTE    PATIENT: Leslie Gross DOB: April 05, 1951   MR #: [id number]  AGE: 65 y.o.    FACILITY PROVIDER #: [id number] PRIMARY MD: Lana Fish, MD    HICN# Medicare Sub. 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]      Date of Service Leslie Gross Received On: 03/22/16   Treatment Time Start Time: 1000 to Stop Time: 1105   Time Calculation Time Calculation (min): 65 min   Visit # Leslie Gross Visit  Leslie Gross Visit Number: 7   Units Billed   Therapeutic Interventions  $ Leslie Gross Electrical Stimulation- Unattended (V2536): 1 Procedure  $ Leslie Gross Therapeutic Exercise (97110): 2 Units  $ Leslie Gross Manual Therapy (64403): 1 Unit     Leslie Gross referred for physical therapy services by: Diona Fanti, MD    Certification period, precautions, medications and allergies and goals copied from initial evaluation - reviewed and reconciled today.  Leslie Gross, Leslie Gross 03/22/2016  Certification Period:  02/28/16 to 05/30/16    Treatment Diagnosis:  Neck Pain [M54.2], Decreased Range of Motion in shoulder [M25.611], Acute shoulder Pain M25.511] and shoulder weakness M62.81    Date of onset: > 2 years  Date of surgery: 02/12/16  Imaging Performed: Yes--result At surgeon's office.    Precautions: Reverse total shoulder replacement protocol. No ER, extension stretch or strengthening until OK by MD.   Medications listed in EMR:  has a current medication list which includes the following prescription(s): amitriptyline, calcium citrate-vitamin d, carvedilol, cyanocobalamin, cyclosporine, diphenhydramine-acetaminophen, ergocalciferol, lactobacillus, levothyroxine, lisinopril, mesalamine, multivitamin, and pantoprazole. (see scanned list in chart for detailed list)  Patient/Caregiver does not report changes to medication at this time.  Allergies:  shellfish-derived products, diflucan, tape (blisters)   Goals:  Short Term Goals: To be met by 6 weeks  1 Leslie Gross will be 100% compliant w/ HEP to increase ROM, strength. MET  2 Increase cervical rot'n to WNL to allow Leslie Gross to look over shoulder for progressing towards driving.  3 Increase shoulder flexion AROM to at least 120 degrees to progress Leslie Gross to reach for objects on high shelf/cupboard.  4 Improve SPADI to <75% indicating decreased pain and improved function.  5 Leslie Gross will report no > 5/10 w/ ROM.    Long Term Goals: To be met by 12 weeks  6. Leslie Gross will report >90% decreased sensation of Rt shoulder dislocation.  7. Leslie Gross will have >130 deg Rt shoulder flexion to allow Leslie Gross to reach into cabinets.  8. Leslie Gross will have at least 4/5 strength in Rt shoulder to allow Leslie Gross to do IADLs w/o difficulty.  9. SPADI score will < 25% indicating decreased pain and improved function.  10. Leslie Gross will have no > 2/10 at worst while doing IADLs, ADLs etc.    Working Toward the Above Goals: (copied from last visit):    GOAL# Progress Toward Goals   2 Progressing     EXAMINATION:  Subjective Report: Leslie Gross thinks she is getting better.  Still sleeping w/ sling.    Pain:  5-6/10 w/ movement only; at rest 2-3/10  Objective Findings today:   AAROM w/ cane supine: flxn 124 deg; abd: 70 deg;  PROM: ER: to neutral    INTERVENTION:  Treatment Performed:  MODALITIES  In order to Decrease pain  ELECTRICAL STIMULATION  IFC to Rt shoulder for 10 minutes with COLD PACK to shoulder.  MH to shoulder 10' pre exercise.  MANUAL THERAPY: STM to Rt medial scap muscles and Rt thoracic erector spiane muscles to decrease guarding.  Gentle oscillations in prone at [facility] joint grade I-II  THERAPEUTIC EXERCISES: 1:1 per exercise flow sheet to improve flexibility, ROM, strength, stability and posture.    Exercise Specifics Date 6/6 Date6/9 Date Date Date Date   Supine flxn PROM x10-4 x10-2  Cane: x10-2       Supine abd PROM x10-4 x10-2  Cane x10-2       ER  PROM  x10-4 x10-2  Cane x10-2       Ext to neutral prone x10-2 x10-2       pendulum Prone cw/ccw x10-2 x10-2       pendulum Prone abd/add x10-2 row (neutral)  x10-2       pulleys flxn x10-2 (seated) -       flx/ext isometric x10 x10       abd/add Isometric  x10 x10       IR/ER isometric x10 x10         Patient Education:   Patient was educated on keeping Rt medial scap muscles relaxed while doing her exercises.   Patient did verbalize an understanding of this education.   Home Exercise Program was reviewed today.[name]    EVALUATION AND DIAGNOSIS:   Leslie Gross is a 65 y.o. female presents to physical therapy today s/p reverse shoulder replacement on Rt. Leslie Gross has not been using sling during day time--only at night and has less pain at night.    Impairments in body function and structure:  Decreased PROM  Decreased AROM  Decreased Strength   Pain  Impaired posture  See above review of systems for details.    Activity Limitations / Restricted Participation:  Required assistance with ADL's: difficulty w/ washing hair, dressing   Required asssitance with IADL's: not able to use Rt UE for any activities  Unable to perform light household duties daily: not able to use Rt UE  Not driving due to Rt arm in sling.    PLAN:   Patient requires continued skilled intervention in order to decrease pain. and meet above mentioned functional goals.  Focus on Rt shoulder ROM and progress per protocol next visit.    Therapist Signature:    Leslie Gross, Leslie Gross, Leslie Gross [phone]  Outpatient Speciality Rehab  Physical Medicine and Rehabilitation  [facility]  P: [phone]     03/22/2016

## 2016-03-25 ENCOUNTER — Ambulatory Visit: Payer: Medicare Other

## 2016-03-26 ENCOUNTER — Ambulatory Visit: Payer: Medicare Other

## 2016-03-26 DIAGNOSIS — R29898 Other symptoms and signs involving the musculoskeletal system: Secondary | ICD-10-CM

## 2016-03-26 DIAGNOSIS — M25511 Pain in right shoulder: Secondary | ICD-10-CM

## 2016-03-26 NOTE — Progress Notes (Signed)
[facility]  [address], [address]  [place], [place]  [address]  Phone:  [phone]  Fax:  [phone]    PHYSICAL THERAPY DAILY TREATMENT NOTE    PATIENT: Leslie Gross DOB: Dec 01, 1950   MR #: [id number]  AGE: 65 y.o.    FACILITY PROVIDER #: [id number] PRIMARY MD: Lana Fish, MD    HICN# Medicare Sub. 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  [name], PT 03/26/2016  Certification Period:  02/28/16 to 05/30/16    Treatment Diagnosis:  Neck Pain [M54.2], Decreased Range of Motion in shoulder [M25.611], Acute shoulder Pain M25.511] and shoulder weakness M62.81    Date of onset: > 2 years  Date of surgery: 02/12/16  Imaging Performed: Yes--result At surgeon's office.    Precautions: Reverse total shoulder replacement protocol. No ER, extension stretch or strengthening until OK by MD.   Medications listed in EMR:  has a current medication list which includes the following prescription(s): amitriptyline, calcium citrate-vitamin d, carvedilol, cyanocobalamin, cyclosporine, diphenhydramine-acetaminophen, ergocalciferol, lactobacillus, levothyroxine, lisinopril, mesalamine, multivitamin, and pantoprazole. (see scanned list in chart for detailed list)  Patient/Caregiver does not report changes to medication at this time.  Allergies:  shellfish-derived products, diflucan, tape (blisters)   Goals:  Short Term Goals: To be met by 6 weeks  1 Pt will be 100% compliant w/ HEP to increase ROM, strength. MET  2 Increase cervical rot'n to WNL to allow pt to look over shoulder for progressing towards driving.  3 Increase shoulder flexion AROM to at least 120 degrees to progress pt to reach for objects on high shelf/cupboard.  4 Improve SPADI to <75% indicating decreased pain and improved function.  5 Pt will report no > 5/10 w/ ROM.    Long Term Goals: To be met by 12 weeks  6. Pt will report >90% decreased sensation of Rt shoulder dislocation.  7. Pt will have >130 deg Rt shoulder flexion to allow pt to reach into cabinets.  8. Pt will have at least 4/5 strength in Rt shoulder to allow pt to do IADLs w/o difficulty.  9. SPADI score will < 25% indicating decreased pain and improved function.  10. Pt will have no > 2/10 at worst while doing IADLs, ADLs etc.       Working Toward the Above Goals: (copied from last visit):    GOAL# Progress Toward Goals   2 Progressing   6 Progressing     EXAMINATION:  Subjective Report: Pt reports 75% improvement w/ feeling of Rt shoulder dislocation since she started PT.  Pt has started to feed self and use Rt UE for some ADLs.  She has a hard time not hiking her shoulder w/ activities/exercises.    Pain:    At rest: 3/10; in supine 4/10  in Rt shoulder  Objective Findings today:     INTERVENTION:  Treatment Performed:  MODALITIES  In order to Decrease pain  ELECTRICAL STIMULATION  Pre-Modulated to right shoulder for 10 minutes with COLD PACK to shoulder   MANUAL THERAPY: STM to Rt medial scap muscles and Rt thoracic erector spiane muscles to decrease guarding. Gentle oscillations in prone at [facility] joint grade I-II  THERAPEUTIC EXERCISES: 1:1 per exercise flow sheet to improve flexibility, ROM, strength, stability and posture. VCs and TCs given to perform exercises correctly.    Exercise Specifics Date 6/6  Date6/9 Date 6/13 Date Date   Supine flxn PROM x10-4 x10-2  Cane: x10-2 x10-2  Cane x10-2     Supine abd PROM x10-4 x10-2  Cane x10-2 x10-2     ER PROM  x10-4 x10-2  Cane x10-2 x10-2     Ext to neutral prone x10-2 x10-2 x10-2     pendulum Prone cw/ccw x10-2 x10-2 x10-2     pendulum Prone abd/add x10-2 row (neutral)  x10-2 x10-2     pulleys flxn x10-2 (seated) -      flx/ext isometric x10 x10 x10     abd/add Isometric  x10 x10 x10     IR/ER isometric x10 x10 x10     Chest press supine - - cane x10-2     Scapular protraction supine - - AAROM x10-2     Stabilization drills 90 deg shoulder flxn - - W/ PT supporting arm       Patient Education:   Patient was educated on focusing on not hiking Rt shoulder up during exercises.    Patient did verbalize an understanding of this education.   Home Exercise Program was reviewed today.[name]    EVALUATION AND DIAGNOSIS:   Leslie Gross is a 65 y.o. female presents to physical therapy today s/p Rt reverse shoulder replacement.  Pt continues w/ Rt upper arm and medial scapular pain post exercises and this may be due to her difficulty w/ not hiking Rt shoulder up while exercising.    Impairments in body function and structure:  Decreased PROM  Decreased AROM  Decreased Strength   Pain  Impaired posture  See above review of systems for details.    Activity Limitations / Restricted Participation:  Required assistance with ADL's: difficulty w/ washing hair, dressing   Required asssitance with IADL's: not able to use Rt UE for any activities  Unable to perform light household duties daily: not able to use Rt UE  Not driving due to Rt arm in sling.    PLAN:   Patient requires continued skilled intervention in order to decrease pain. and meet above mentioned functional goals.  Focus on Rt shoulder ROM, progress per protocol next  visit.    Therapist Signature:    [name], PT, MPT [phone]  Outpatient Speciality Rehab  Physical Medicine and Rehabilitation  [facility]  P: [phone]     03/26/2016

## 2016-03-27 ENCOUNTER — Ambulatory Visit: Payer: Medicare Other

## 2016-03-29 ENCOUNTER — Ambulatory Visit: Payer: Medicare Other

## 2016-04-01 ENCOUNTER — Ambulatory Visit: Payer: Medicare Other

## 2016-04-01 DIAGNOSIS — G8929 Other chronic pain: Secondary | ICD-10-CM

## 2016-04-01 DIAGNOSIS — M25511 Pain in right shoulder: Secondary | ICD-10-CM

## 2016-04-01 DIAGNOSIS — Z96611 Presence of right artificial shoulder joint: Secondary | ICD-10-CM

## 2016-04-01 DIAGNOSIS — M25611 Stiffness of right shoulder, not elsewhere classified: Secondary | ICD-10-CM

## 2016-04-01 DIAGNOSIS — R29898 Other symptoms and signs involving the musculoskeletal system: Secondary | ICD-10-CM

## 2016-04-02 ENCOUNTER — Ambulatory Visit: Payer: Medicare Other

## 2016-04-02 NOTE — Progress Notes (Signed)
[facility]  [address]  [place], [place]  [address]  Phone:  [phone]  Fax:  [phone]    PHYSICAL THERAPY DAILY TREATMENT NOTE    PATIENT: Leslie Gross DOB: 1951/10/14   MR #: [id number]  AGE: 65 y.o.    FACILITY PROVIDER #: [id number] PRIMARY MD: Lana Fish, MD    HICN# Medicare Sub. 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  [name], PT 04/01/2016  Certification Period:  02/28/16 to 05/30/16    Treatment Diagnosis:  Neck Pain [M54.2], Decreased Range of Motion in shoulder [M25.611], Acute shoulder Pain M25.511] and shoulder weakness M62.81    Date of onset: > 2 years  Date of surgery: 02/12/16  Imaging Performed: Yes--result At surgeon's office.    Precautions: Reverse total shoulder replacement protocol. No ER, extension stretch or strengthening until OK by MD.   Medications listed in EMR:  has a current medication list which includes the following prescription(s): amitriptyline, calcium citrate-vitamin d, carvedilol, cyanocobalamin, cyclosporine, diphenhydramine-acetaminophen, ergocalciferol, lactobacillus, levothyroxine, lisinopril, mesalamine, multivitamin, and pantoprazole. (see scanned list in chart for detailed list)  Patient/Caregiver does not report changes to medication at this time.  Allergies:  shellfish-derived products, diflucan, tape (blisters)   Goals:  Short Term Goals: To be met by 6 weeks  1 Pt will be 100% compliant w/ HEP to increase ROM, strength. MET  2 Increase cervical rot'n to WNL to allow pt to look over shoulder for progressing towards driving. Progressing  3 Increase shoulder flexion AROM to at least 120 degrees to progress pt to reach for objects on high shelf/cupboard.  4 Improve SPADI to <75% indicating decreased pain and improved function.  5 Pt will report no > 5/10 w/ ROM.    Long Term Goals: To be met by 12 weeks  6. Pt will report >90% decreased sensation of Rt shoulder dislocation. Progressing  7. Pt will have >130 deg Rt shoulder flexion to allow pt to reach into cabinets.  8. Pt will have at least 4/5 strength in Rt shoulder to allow pt to do IADLs w/o difficulty.  9. SPADI score will < 25% indicating decreased pain and improved function.  10. Pt will have no > 2/10 at worst while doing IADLs, ADLs etc.       Working Toward the Above Goals: (copied from last visit):    EXAMINATION:  Subjective Report: Pt reports that she was guiding something heavy w/ her Rt hand and felt soreness.  This past weekend she tripped on her own feet and lost balance forward.  She hit Rt shoulder on the door frame.  The shoulder has been sore, she's been taking anti-inflammatory meds 400 mg Advil every four hours and took Tylenol also for pain.  Today the shoulder pain is  much better but pt doesn't think she can move her shoulder as before.  This PT called MD's office and notified the office regarding pt's injury.  Pt to call the office after PT and schedule an appointment w/ MD.    Pain:    4 on a scale of 0 - 10, location: right shoulder    Objective Findings today: supine PROM: flxn grossly 100 deg, abd 90 deg.    INTERVENTION:  Treatment Performed:  MODALITIES  In order to Decrease pain  ELECTRICAL STIMULATION  IFC to right shoulder for 15 minutes with MHP to shoulder   MANUAL THERAPY : Rt  scapular mobs in all direction; STM/DTM medial scap muscles, Rt UT.  End range stretch and hold to increase ROM and decrease guarding.  THERAPEUTIC EXERCISES  : 1:1 per exercise flow sheet to improve flexibility, ROM.  Exercise Specifics Date 6/6 Date6/9 Date 6/13 Date  6/20 Date   Supine flxn PROM x10-4 x10-2  Cane: x10-2 x10-2  Cane x10-2 PROM x10-4    Supine abd PROM x10-4 x10-2  Cane x10-2 x10-2 PROM x10-4    ER PROM  x10-4 x10-2  Cane x10-2 x10-2 PROM x10-4    Ext to neutral prone x10-2 x10-2 x10-2 -    pendulum Prone cw/ccw x10-2 x10-2 x10-2 -    pendulum Prone abd/add x10-2 row (neutral)  x10-2 x10-2 -    pulleys flxn x10-2 (seated) -  -    flx/ext isometric x10 x10 x10 -    abd/add Isometric  x10 x10 x10 -    IR/ER isometric x10 x10 x10 -    Chest press supine - - cane x10-2 AAROM x10-2    Scapular protraction supine - - AAROM x10-2 -    Stabilization drills 90 deg shoulder flxn - - W/ PT supporting arm -               Patient Education:   Patient was educated on following up w/ MD after any injury to the Rt shoulder.  Pt also not allowed to lift anything w/ Rt hand as she hasn't started resistance training or achieved full AROM.  Patient did verbalize an understanding of this education.   Home Exercise Program was reviewed today.[name]    EVALUATION AND DIAGNOSIS:   ADREAN FINDLAY is a 65 y.o. female presents to physical therapy today s/p Rt reverse total shoulder replacement.  Pt's increased shoulder pain is due to the injury to the shoulder and it may also be due to pt's loading the Rt UE.  Pt not cleared to lift anything w/ Rt UE per protocol.    Impairments in body function and structure:  Decreased PROM  Decreased AROM  Decreased Strength   Pain  Impaired  posture  See above review of systems for details.    Activity Limitations / Restricted Participation:  Required assistance with ADL's: difficulty w/ washing hair, dressing   Required asssitance with IADL's: not able to use Rt UE for any activities  Unable to perform light household duties daily: not able to use Rt UE  Not driving due to Rt arm in sling.    PLAN:   Patient requires continued skilled intervention in order to decrease pain. and meet above mentioned functional goals.  Focus on Rt shoulder ROM, progress per protocol next visit.    Therapist Signature:    [name], PT, MPT [phone]  Outpatient Speciality Rehab  Physical Medicine and Rehabilitation  [facility]  P: [phone]  04/01/2016

## 2016-04-03 ENCOUNTER — Ambulatory Visit: Payer: Medicare Other

## 2016-04-05 ENCOUNTER — Ambulatory Visit: Payer: Medicare Other

## 2016-04-05 DIAGNOSIS — M25619 Stiffness of unspecified shoulder, not elsewhere classified: Secondary | ICD-10-CM

## 2016-04-05 DIAGNOSIS — Z96611 Presence of right artificial shoulder joint: Secondary | ICD-10-CM

## 2016-04-05 DIAGNOSIS — M25511 Pain in right shoulder: Secondary | ICD-10-CM

## 2016-04-05 DIAGNOSIS — G8929 Other chronic pain: Secondary | ICD-10-CM

## 2016-04-05 NOTE — Progress Notes (Addendum)
[facility]  [address]  [place], [place]  [address]  Phone:  [phone]  Fax:  [phone]    PHYSICAL THERAPY DAILY TREATMENT NOTE    PATIENT: Leslie Gross DOB: Dec 26, 1950   MR #: [id number]  AGE: 65 y.o.    FACILITY PROVIDER #: [id number] PRIMARY MD: Lana Fish, MD    HICN# Medicare Sub. 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  Leslie Gross, PT 04/05/2016  Certification Period:  02/28/16 to 05/30/16    Treatment Diagnosis:  Neck Pain [M54.2], Decreased Range of Motion in shoulder [M25.611], Acute shoulder Pain M25.511] and shoulder weakness M62.81    Date of onset: > 2 years  Date of surgery: 02/12/16  Imaging Performed: Yes--result At surgeon's office.    Precautions: Reverse total shoulder replacement protocol. No ER, extension stretch or strengthening until OK by MD.   Medications listed in EMR:  has a current medication list which includes the following prescription(s): amitriptyline, calcium citrate-vitamin d, carvedilol, cyanocobalamin, cyclosporine, diphenhydramine-acetaminophen, ergocalciferol, lactobacillus, levothyroxine, lisinopril, mesalamine, multivitamin, and pantoprazole. (see scanned list in chart for detailed list)  Patient/Caregiver does not report changes to medication at this time.  Allergies:  shellfish-derived products, diflucan, tape (blisters)   Goals:  Short Term Goals: To be met by 6 weeks  1 Pt will be 100% compliant w/ HEP to increase ROM, strength. MET  2 Increase cervical rot'n to WNL to allow pt to look over shoulder for progressing towards driving.  3 Increase shoulder flexion AROM to at least 120 degrees to progress pt to reach for objects on high shelf/cupboard.  4 Improve SPADI to <75% indicating decreased pain and improved function.  5 Pt will report no > 5/10 w/ ROM.    Long Term Goals: To be met by 12 weeks  6. Pt will report >90% decreased sensation of Rt shoulder dislocation.  7. Pt will have >130 deg Rt shoulder flexion to allow pt to reach into cabinets.  8. Pt will have at least 4/5 strength in Rt shoulder to allow pt to do IADLs w/o difficulty.  9. SPADI score will < 25% indicating decreased pain and improved function.  10. Pt will have no > 2/10 at worst while doing IADLs, ADLs etc.       Working Toward the Above Goals: (copied from last visit):    GOAL# Progress Toward Goals   2 Progressing   6 Progressing     EXAMINATION:  Subjective Report: Pt reports 75% improvement w/ feeling of Rt shoulder dislocation since she started PT.  Pt has started to feed self and use Rt UE for some ADLs.  She has a hard time not hiking her shoulder w/ activities/exercises.    Pain:    At rest: 3/10; in supine 4/10  in Rt shoulder  Objective Findings today:   SPADI: 75%  INTERVENTION:  Treatment Performed:  MODALITIES  In order to Decrease pain  ELECTRICAL STIMULATION  Pre-Modulated to right shoulder for 10 minutes with COLD PACK to shoulder   MANUAL THERAPY: STM to Rt medial scap muscles and Rt thoracic erector spiane muscles to decrease guarding. STM/DTM Rt levt scap in Lt side lying; Rt scapula mobs in all direction w/ ant elevation and post depression of Rt scapula.  Gentle oscillations in prone at [facility] joint grade I-II  THERAPEUTIC EXERCISES: 1:1 per exercise flow sheet to improve  flexibility, ROM, strength, stability and posture. VCs and TCs given to perform exercises correctly.    Exercise Specifics Date 6/6 Date6/9 Date 6/13 Date  6/23 Date   Supine flxn PROM x10-4 x10-2  Cane: x10-2 x10-2  Cane x10-2 AAROM x10-3    Supine abd PROM x10-4 x10-2  Cane x10-2 x10-2 AAROM x10-3    ER PROM  x10-4 x10-2  Cane x10-2 x10-2 x10-2    Ext to neutral prone x10-2 x10-2 x10-2 AAROM x10-2    pendulum Prone cw/ccw x10-2 x10-2 x10-2 x10-2    pendulum Prone abd/add x10-2 row (neutral)  x10-2 x10-2 -    pulleys flxn x10-2 (seated) - - -    flx/ext isometric x10 x10 x10     abd/add Isometric  x10 x10 x10     IR/ER isometric x10 x10 x10     Chest press supine - - cane x10-2     Scapular protraction supine - - AAROM x10-2     Stabilization drills 90 deg shoulder flxn - - W/ PT supporting arm       Patient Education:   Patient was educated on focusing on not hiking Rt shoulder up during exercises.    Patient did verbalize an understanding of this education.   Home Exercise Program was reviewed today.[name]    EVALUATION AND DIAGNOSIS:   Leslie Gross is a 65 y.o. female presents to physical therapy today s/p Rt reverse shoulder replacement.  Pt continues w/ Rt neck and levt scap insertion area pain and this is due to pt keeping Rt scapula elevated at rest.    Impairments in body function and structure:  Decreased PROM  Decreased AROM  Decreased Strength   Pain  Impaired posture  See above review of systems for details.    Activity Limitations / Restricted Participation:  Required assistance with ADL's: difficulty w/ washing hair, dressing   Required asssitance with IADL's: not able to use Rt UE for any activities  Unable to perform light household duties daily: not able to use Rt UE  Not driving due to Rt arm in sling.    PLAN:   Patient requires continued skilled  intervention in order to decrease pain. and meet above mentioned functional goals.  Focus on Rt shoulder ROM, progress per protocol next visit.    Therapist Signature:    Leslie Gross, PT, MPT [phone]  Outpatient Speciality Rehab  Physical Medicine and Rehabilitation  [facility]  P: [phone]     04/05/2016

## 2016-04-08 ENCOUNTER — Ambulatory Visit: Payer: Medicare Other

## 2016-04-09 ENCOUNTER — Ambulatory Visit: Payer: Medicare Other

## 2016-04-09 DIAGNOSIS — M25611 Stiffness of right shoulder, not elsewhere classified: Secondary | ICD-10-CM

## 2016-04-09 DIAGNOSIS — G8929 Other chronic pain: Secondary | ICD-10-CM

## 2016-04-09 DIAGNOSIS — M25511 Pain in right shoulder: Secondary | ICD-10-CM

## 2016-04-09 DIAGNOSIS — Z96611 Presence of right artificial shoulder joint: Secondary | ICD-10-CM

## 2016-04-09 NOTE — Progress Notes (Signed)
[facility]  [address], [address]  [place], [place]  [address]  Phone:  [phone]  Fax:  [phone]    PHYSICAL THERAPY DAILY TREATMENT NOTE    PATIENT: Leslie Gross DOB: 02-28-1951   MR #: [id number]  AGE: 65 y.o.    FACILITY PROVIDER #: [id number] PRIMARY MD: Lana Fish, MD    HICN# Medicare Sub. 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  [name], PT 04/09/2016  Certification Period:  02/28/16 to 05/30/16    Treatment Diagnosis:  Neck Pain [M54.2], Decreased Range of Motion in shoulder [M25.611], Acute shoulder Pain M25.511] and shoulder weakness M62.81    Date of onset: > 2 years  Date of surgery: 02/12/16  Imaging Performed: Yes--result At surgeon's office.    Precautions: Reverse total shoulder replacement protocol. No ER, extension stretch or strengthening until OK by MD.   Medications listed in EMR:  has a current medication list which includes the following prescription(s): amitriptyline, calcium citrate-vitamin d, carvedilol, cyanocobalamin, cyclosporine, diphenhydramine-acetaminophen, ergocalciferol, lactobacillus, levothyroxine, lisinopril, mesalamine, multivitamin, and pantoprazole. (see scanned list in chart for detailed list)  Patient/Caregiver does not report changes to medication at this  time.  Allergies: shellfish-derived products, diflucan, tape (blisters)   Goals:  Short Term Goals: To be met by 6 weeks  1 Pt will be 100% compliant w/ HEP to increase ROM, strength. MET  2 Increase cervical rot'n to WNL to allow pt to look over shoulder for progressing towards driving. PROGRESSING  3 Increase shoulder flexion AROM to at least 120 degrees to progress pt to reach for objects on high shelf/cupboard.  4 Improve SPADI to <75% indicating decreased pain and improved function.  5 Pt will report no > 5/10 w/ ROM. PROGRESSING    Long Term Goals: To be met by 12 weeks  6. Pt will report >90% decreased sensation of Rt shoulder dislocation.  7. Pt will have >130 deg Rt shoulder flexion to allow pt to reach into cabinets.  8. Pt will have at least 4/5 strength in Rt shoulder to allow pt to do IADLs w/o difficulty.  9. SPADI score will < 25% indicating decreased pain and improved function.  10. Pt will have no > 2/10 at worst while doing IADLs, ADLs etc.       Working Toward the Above Goals: (copied from last visit):      EXAMINATION:  Subjective Report: Pt reports 75% improvement w/ feeling of Rt shoulder dislocation since she started PT.  Pt has started to feed self and use Rt UE for some ADLs.  She has a hard time not hiking her shoulder w/ activities/exercises.    Pain:    At rest: 2-3/10; 3-4/10 in Rt shoulder w/ therex  Objective Findings today:   INTERVENTION:  Treatment  Performed:  MODALITIES  In order to Decrease pain  ELECTRICAL STIMULATION  Pre-Modulated to right shoulder for 10 minutes with COLD PACK to shoulder   MANUAL THERAPY: STM to Rt medial scap muscles and Rt thoracic erector spiane muscles to decrease guarding. STM/DTM Rt levt scap in Lt side lying; Rt scapula mobs in all direction w/ ant elevation and post depression of Rt scapula.  Gentle oscillations in prone [facility] grade I-II  THERAPEUTIC EXERCISES: 1:1 per exercise flow sheet to improve flexibility, ROM, strength, stability  and posture. VCs and TCs given to perform exercises correctly.    Exercise Specifics Date 6/6 Date6/9 Date 6/13 Date  6/23 Date6/27   Supine flxn PROM x10-4 x10-2  Cane: x10-2 x10-2  Cane x10-2 AAROM x10-3 w/ cane x10-2   Supine abd PROM x10-4 x10-2  Cane x10-2 x10-2 AAROM x10-3 w/ cane x10  Side lying: x10 AAROM   ER PROM  x10-4 x10-2  Cane x10-2 x10-2 x10-2 x10-2 at 45 deg   Ext to neutral prone x10-2 x10-2 x10-2 AAROM x10-2 seated rows x10-2 (YTB)   pendulum Prone cw/ccw x10-2 x10-2 x10-2 x10-2 Supine scap protraction x10-2 (cane + min A)   pendulum Prone abd/add x10-2 row (neutral)  x10-2 x10-2 - elbow flexion: Lt #1 x12-2   pulleys flxn x10-2 (seated) - - - x10  abd x10   flx/ext isometric x10 x10 x10 - x10   abd/add Isometric  x10 x10 x10 - x10   IR/ER isometric x10 x10 x10 - x10   Chest press supine - - cane x10-2 - x10-2   Scapular protraction supine - - AAROM x10-2 - x10-2   Stabilization drills 90 deg shoulder flxn - - W/ PT supporting arm - -     Patient Education:   Patient was educated on focusing on not hiking Rt shoulder up during exercises.  Updated HEP to include isometric shoulder exercises.  Patient did verbalize and demo an understanding of this education.   Home Exercise Program was practiced/updated today.[name]    EVALUATION AND DIAGNOSIS:   Leslie Gross is a 65 y.o. female presents to physical therapy today s/p Rt reverse shoulder replacement.  Good challenge w/ therex today--initiated strengthening exercise for elbow today..  Pt able to do side lying Rt shldr abduction w/ min A w/ better form, in supine she stiffens her shoulder and ends up w/ pain.      Impairments in body function and structure:  Decreased PROM  Decreased AROM  Decreased Strength   Pain  Impaired posture  See above review of systems for details.    Activity Limitations /  Restricted Participation:  Required assistance with ADL's: difficulty w/ washing hair, dressing   Required asssitance with IADL's: not able to use Rt UE for any activities  Unable to perform light household duties daily: not able to use Rt UE  Not driving due to Rt arm in sling.    PLAN:   Patient requires continued skilled intervention in order to decrease pain. and meet above mentioned functional goals.  Focus on Rt shoulder ROM, progress per protocol, measure shoulder ROM next visit.    Therapist Signature:    [name], PT, MPT [phone]  Outpatient Speciality Rehab  Physical Medicine and Rehabilitation  [facility]  P: [phone]     04/09/2016

## 2016-04-10 ENCOUNTER — Ambulatory Visit: Payer: Medicare Other

## 2016-04-11 ENCOUNTER — Ambulatory Visit: Payer: Medicare Other

## 2016-04-11 DIAGNOSIS — G8929 Other chronic pain: Secondary | ICD-10-CM

## 2016-04-11 DIAGNOSIS — Z96611 Presence of right artificial shoulder joint: Secondary | ICD-10-CM

## 2016-04-11 DIAGNOSIS — R29898 Other symptoms and signs involving the musculoskeletal system: Secondary | ICD-10-CM

## 2016-04-11 DIAGNOSIS — M25619 Stiffness of unspecified shoulder, not elsewhere classified: Secondary | ICD-10-CM

## 2016-04-11 DIAGNOSIS — M25511 Pain in right shoulder: Secondary | ICD-10-CM

## 2016-04-11 NOTE — Progress Notes (Signed)
[facility]  [address]  [place], [place]  [address]  Phone:  [phone]  Fax:  [phone]    PHYSICAL THERAPY DAILY TREATMENT NOTE    PATIENT: Leslie Gross DOB: 09/08/1951   MR #: [id number]  AGE: 65 y.o.    FACILITY PROVIDER #: [id number] PRIMARY MD: Lana Fish, MD    HICN# Medicare Sub. 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  [name], PT 04/11/2016  Certification Period:  02/28/16 to 05/30/16    Treatment Diagnosis:  Neck Pain [M54.2], Decreased Range of Motion in shoulder [M25.611], Acute shoulder Pain M25.511] and shoulder weakness M62.81    Date of onset: > 2 years  Date of surgery: 02/12/16  Imaging Performed: Yes--result At surgeon's office.    Precautions: Reverse total shoulder replacement protocol. No ER, extension stretch or strengthening until OK by MD.   Medications listed in EMR:  has a current medication list which includes the following prescription(s): amitriptyline, calcium citrate-vitamin d, carvedilol, cyanocobalamin, cyclosporine, diphenhydramine-acetaminophen, ergocalciferol, lactobacillus, levothyroxine, lisinopril, mesalamine, multivitamin, and pantoprazole. (see scanned list in chart for detailed list)  Patient/Caregiver does not report changes to medication at this  time.  Allergies: shellfish-derived products, diflucan, tape (blisters)   Goals:  Short Term Goals: To be met by 6 weeks  1 Pt will be 100% compliant w/ HEP to increase ROM, strength. MET  2 Increase cervical rot'n to WNL to allow pt to look over shoulder for progressing towards driving. PROGRESSING  3 Increase shoulder flexion AROM to at least 120 degrees to progress pt to reach for objects on high shelf/cupboard.  4 Improve SPADI to <75% indicating decreased pain and improved function.  5 Pt will report no > 5/10 w/ ROM. PROGRESSING    Long Term Goals: To be met by 12 weeks  6. Pt will report >90% decreased sensation of Rt shoulder dislocation.  7. Pt will have >130 deg Rt shoulder flexion to allow pt to reach into cabinets.  8. Pt will have at least 4/5 strength in Rt shoulder to allow pt to do IADLs w/o difficulty.  9. SPADI score will < 25% indicating decreased pain and improved function.  10. Pt will have no > 2/10 at worst while doing IADLs, ADLs etc.       Working Toward the Above Goals: (copied from last visit):      EXAMINATION:  Subjective Report: unremarkable today.  Pain:    At rest: 2-3/10; 3-4/10 in Rt shoulder w/ therex  Objective Findings today:   INTERVENTION:  Treatment Performed:  MODALITIES  In order to Decrease pain  ELECTRICAL STIMULATION  Pre-Modulated to right shoulder for 10 minutes with COLD PACK to shoulder   MANUAL THERAPY: STM to Rt medial scap muscles and Rt thoracic erector spiane  muscles to decrease guarding. STM/DTM Rt levt scap in Lt side lying; Rt scapula mobs in all direction w/ ant elevation and post depression of Rt scapula.    THERAPEUTIC EXERCISES: 1:1 per exercise flow sheet to improve flexibility, ROM, strength, stability and posture. VCs and TCs given to perform exercises correctly. Emphasis on not hiking shoulder up w/ horizontal abd, scap protraction and chest press.    Exercise Specifics Date 6/6 Date6/9 Date 6/13 Date  6/23 Date6/27 Date 6/29    Supine flxn PROM x10-4 x10-2  Cane: x10-2 x10-2  Cane x10-2 AAROM x10-3 w/ cane x10-2 x10-1   Supine abd PROM x10-4 x10-2  Cane x10-2 x10-2 AAROM x10-3 w/ cane x10  Side lying: x10 AAROM Side lying: x10-2 AAROM.   ER PROM  x10-4 x10-2  Cane x10-2 x10-2 x10-2 x10-2 at 45 deg x10-2   Ext to neutral prone x10-2 x10-2 x10-2 AAROM x10-2 seated rows x10-2 (YTB) x10-2   pendulum Prone cw/ccw x10-2 x10-2 x10-2 x10-2 Supine scap protraction x10-2 (cane + min A) Cane x10 -2   pendulum Prone abd/add x10-2 row (neutral)  x10-2 x10-2 - elbow flexion: Lt #1 x12-2 W/ YTB x10-2   pulleys flxn x10-2 (seated) - - - x10  abd x10 -   flx/ext isometric x10 x10 x10 - x10 -   abd/add Isometric  x10 x10 x10 - x10 -   IR/ER isometric x10 x10 x10 - x10 -   Chest press supine - - cane x10-2 - x10-2 x10-2   Scapular protraction supine - - AAROM x10-2 - x10-2 sied lying: Rt horizontal abd w/ assist x10-2   Stabilization drills 90 deg shoulder flxn - - W/ PT supporting arm - - x2 sets     Patient Education:   Patient was educated on focusing on not hiking Rt shoulder up during exercises.  Updated HEP.  Patient did verbalize and demo an understanding of this education.   Home Exercise Program was practiced/updated today.[name]    EVALUATION AND DIAGNOSIS:   Leslie Gross is a 65 y.o. female presents to physical therapy today s/p Rt reverse shoulder replacement.  Good challenge w/ therex today--initiated strengthening exercise for elbow today..  Pt able to do side lying Rt shldr abduction w/ min A w/ better form, in supine she stiffens her shoulder and ends up w/ pain.      Impairments in body function and structure:  Decreased PROM  Decreased AROM  Decreased Strength   Pain  Impaired posture  See above review of systems for details.    Activity Limitations / Restricted Participation:  Required assistance with ADL's:  difficulty w/ washing hair, dressing   Required asssitance with IADL's: not able to use Rt UE for any activities  Unable to perform light household duties daily: not able to use Rt UE  Not driving due to Rt arm in sling.    PLAN:   Patient requires continued skilled intervention in order to decrease pain. and meet above mentioned functional goals.  Focus on Rt shoulder ROM, progress per protocol, measure shoulder ROM next visit.    Therapist Signature:    [name], PT, MPT [phone]  Outpatient Speciality Rehab  Physical Medicine and Rehabilitation  [facility]  P: [phone]     04/11/2016

## 2016-04-12 ENCOUNTER — Ambulatory Visit: Payer: Medicare Other

## 2016-04-15 ENCOUNTER — Ambulatory Visit: Payer: Medicare Other

## 2016-04-17 ENCOUNTER — Ambulatory Visit: Payer: Medicare Other

## 2016-04-18 ENCOUNTER — Ambulatory Visit: Payer: Medicare Other | Attending: Orthopaedic Surgery

## 2016-04-18 DIAGNOSIS — M6281 Muscle weakness (generalized): Secondary | ICD-10-CM | POA: Insufficient documentation

## 2016-04-18 DIAGNOSIS — M25619 Stiffness of unspecified shoulder, not elsewhere classified: Secondary | ICD-10-CM

## 2016-04-18 DIAGNOSIS — Z96611 Presence of right artificial shoulder joint: Secondary | ICD-10-CM

## 2016-04-18 DIAGNOSIS — M25611 Stiffness of right shoulder, not elsewhere classified: Secondary | ICD-10-CM | POA: Insufficient documentation

## 2016-04-18 DIAGNOSIS — G8929 Other chronic pain: Secondary | ICD-10-CM

## 2016-04-18 DIAGNOSIS — M25511 Pain in right shoulder: Secondary | ICD-10-CM | POA: Insufficient documentation

## 2016-04-18 DIAGNOSIS — R29898 Other symptoms and signs involving the musculoskeletal system: Secondary | ICD-10-CM

## 2016-04-18 DIAGNOSIS — M542 Cervicalgia: Secondary | ICD-10-CM | POA: Insufficient documentation

## 2016-04-18 NOTE — Progress Notes (Signed)
[facility]  [address], [address]  [place], [place]  [address]  Phone:  [phone]  Fax:  [phone]    PHYSICAL THERAPY DAILY TREATMENT NOTE    PATIENT: Leslie Gross DOB: Nov 23, 1950   MR #: [id number]  AGE: 65 y.o.    FACILITY PROVIDER #: [id number] PRIMARY MD: Lana Fish, MD    HICN# Medicare Sub. 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  [name], PT 04/18/2016  Certification Period:  02/28/16 to 05/30/16    Treatment Diagnosis:  Neck Pain [M54.2], Decreased Range of Motion in shoulder [M25.611], Acute shoulder Pain M25.511] and shoulder weakness M62.81    Date of onset: > 2 years  Date of surgery: 02/12/16  Imaging Performed: Yes--result At surgeon's office.    Precautions: Reverse total shoulder replacement protocol. No ER, extension stretch or strengthening until OK by MD.   Medications listed in EMR:  has a current medication list which includes the following prescription(s): amitriptyline, calcium citrate-vitamin d, carvedilol, cyanocobalamin, cyclosporine, diphenhydramine-acetaminophen, ergocalciferol, lactobacillus, levothyroxine, lisinopril, mesalamine, multivitamin, and pantoprazole. (see scanned list in chart for detailed list)  Patient/Caregiver does not report changes to medication at this time.  Allergies:  shellfish-derived products, diflucan, tape (blisters)   Goals:  Short Term Goals: To be met by 6 weeks  1 Pt will be 100% compliant w/ HEP to increase ROM, strength. MET  2 Increase cervical rot'n to WNL to allow pt to look over shoulder for progressing towards driving. PROGRESSING  3 Increase shoulder flexion AROM to at least 120 degrees to progress pt to reach for objects on high shelf/cupboard.  4 Improve SPADI to <75% indicating decreased pain and improved function.  5 Pt will report no > 5/10 w/ ROM. PROGRESSING    Long Term Goals: To be met by 12 weeks  6. Pt will report >90% decreased sensation of Rt shoulder dislocation.  7. Pt will have >130 deg Rt shoulder flexion to allow pt to reach into cabinets.  8. Pt will have at least 4/5 strength in Rt shoulder to allow pt to do IADLs w/o difficulty.  9. SPADI score will < 25% indicating decreased pain and improved function.  10. Pt will have no > 2/10 at worst while doing IADLs, ADLs etc.       Working Toward the Above Goals: (copied from last visit):      EXAMINATION:  Subjective Report: Pt saw the Dr. Rubye Oaks today.  MD happy w/ pt's progress, she is on track per MD.  Pt to continue w/ PT at this time.  using lidocaine patch at night.    Pain:    At rest: 2/10; 3-4/10 in Rt shoulder w/ therex  Objective Findings today: supine: AAROM (cane) flxn 135 deg,     INTERVENTION:  Treatment Performed:  MODALITIES  In order to Decrease pain  ELECTRICAL STIMULATION  Pre-Modulated to right shoulder for 10 minutes with COLD PACK to shoulder   MANUAL THERAPY: PROM w/ gentle stretch into all direction; ER at 45 deg and 90 deg abd (gentle) oscillations;  Rt scapula mobs in all direction w/ ant elevation and post depression of Rt scapula.    THERAPEUTIC EXERCISES: 1:1 per exercise flow sheet to improve flexibility, ROM, strength, stability and posture. VCs and TCs given to perform exercises correctly. Emphasis on not hiking shoulder up w/ horizontal abd, scap protraction  and chest press.    Exercise Specifics Date 6/6 Date6/9 Date 6/13 Date  6/23 Date6/27 Date 6/29 7/6   Supine flxn PROM x10-4 x10-2  Cane: x10-2 x10-2  Cane x10-2 AAROM x10-3 w/ cane x10-2 x10-1 x15 cane  AROM: x10   Supine abd PROM x10-4 x10-2  Cane x10-2 x10-2 AAROM x10-3 w/ cane x10  Side lying: x10 AAROM Side lying: x10-2 AAROM.   x10-2 CGA   ER PROM  x10-4 x10-2  Cane x10-2 x10-2 x10-2 x10-2 at 45 deg x10-2 x10-2   Ext to neutral prone x10-2 x10-2 x10-2 AAROM x10-2 seated rows x10-2 (YTB) x10-2 x10 (standing)   pendulum Prone cw/ccw x10-2 x10-2 x10-2 x10-2 Supine scap protraction x10-2 (cane + min A) Cane x10 -2 Cane x10-2   pendulum Prone abd/add x10-2 row (neutral)  x10-2 x10-2 - elbow flexion: Lt #1 x12-2 W/ YTB x10-2 -   pulleys flxn x10-2 (seated) - - - x10  abd x10 - -   flx/ext isometric x10 x10 x10 - x10 - YTB x10   abd/add Isometric  x10 x10 x10 - x10 - YTB x10   IR/ER isometric x10 x10 x10 - x10 - YTB x10   Chest press supine - - cane x10-2 - x10-2 x10-2 x10-2   Scapular protraction supine - - AAROM x10-2 - x10-2 sied lying: Rt horizontal abd w/ assist x10-2 x10-2 CGA   Stabilization drills 90 deg shoulder flxn - - W/ PT supporting arm - - x2 sets x2 sets   UBE Fwd/retro seat 2 - - - - - - 4'/0'     Patient Education:   Patient was educated on precautions w/ carrying, supporting, lifting etc.  Pt to not lift anything > 5 lbs away from body, do not support in carrying heavier objects w/ Lt UE.  Patient did verbalize and demo an understanding of this education.   Home Exercise Program was reviewed today.      EVALUATION AND DIAGNOSIS:   Leslie Gross is a 65 y.o. female presents to physical therapy today s/p Rt reverse shoulder replacement.  Good challenge w/ updated therex today.  Progressing well w/ ROM and stregnth.  Impairments in body function and  structure:  Decreased PROM  Decreased AROM  Decreased Strength   Pain  Impaired posture  See above review of systems for details.    Activity Limitations / Restricted Participation:  Required assistance with ADL's: difficulty w/ washing hair, dressing   Required asssitance with IADL's: not able to use Rt UE for any activities  Unable to perform light household duties daily: not able to use Rt UE  Not driving due to Rt arm in sling.    PLAN:   Patient requires continued skilled intervention in order to decrease pain. and meet above mentioned functional goals.  Focus on Rt shoulder ROM, progress per protocol, measure shoulder ROM next visit.  Therapist Signature:    [name], PT, MPT [phone]  Outpatient Speciality Rehab  Physical Medicine and Rehabilitation  [facility]  P: [phone]     04/18/2016

## 2016-04-22 ENCOUNTER — Ambulatory Visit: Payer: Medicare Other

## 2016-04-24 ENCOUNTER — Ambulatory Visit: Payer: Medicare Other

## 2016-04-29 ENCOUNTER — Ambulatory Visit: Payer: Medicare Other

## 2016-04-30 ENCOUNTER — Ambulatory Visit: Payer: Medicare Other

## 2016-04-30 DIAGNOSIS — Z96611 Presence of right artificial shoulder joint: Secondary | ICD-10-CM

## 2016-04-30 DIAGNOSIS — M25511 Pain in right shoulder: Secondary | ICD-10-CM

## 2016-04-30 DIAGNOSIS — R29898 Other symptoms and signs involving the musculoskeletal system: Secondary | ICD-10-CM

## 2016-04-30 NOTE — Progress Notes (Addendum)
[facility]  [address]  [place], [place]  [address]  Phone:  [phone]  Fax:  [phone]    PHYSICAL THERAPY DAILY TREATMENT NOTE    PATIENT: Leslie Gross DOB: 04/07/1951   MR #: [id number]  AGE: 65 y.o.    FACILITY PROVIDER #: [id number] PRIMARY MD: Lana Fish, MD    HICN# Medicare Sub. 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  [name], PT 04/30/2016  Certification Period:  02/28/16 to 05/30/16    Treatment Diagnosis:  Neck Pain [M54.2], Decreased Range of Motion in shoulder [M25.611], Acute shoulder Pain M25.511] and shoulder weakness M62.81    Date of onset: > 2 years  Date of surgery: 02/12/16  Imaging Performed: Yes--result At surgeon's office.    Precautions: Reverse total shoulder replacement protocol. No ER, extension stretch or strengthening until OK by MD.   Medications listed in EMR:  has a current medication list which includes the following prescription(s): amitriptyline, calcium citrate-vitamin d, carvedilol, cyanocobalamin, cyclosporine, diphenhydramine-acetaminophen, ergocalciferol, lactobacillus, levothyroxine, lisinopril, mesalamine, multivitamin, and pantoprazole. (see scanned list in chart for detailed list)  Patient/Caregiver does not report changes to medication at this  time.  Allergies: shellfish-derived products, diflucan, tape (blisters)   Goals:  Short Term Goals: To be met by 6 weeks  1 Pt will be 100% compliant w/ HEP to increase ROM, strength. MET  2 Increase cervical rot'n to WNL to allow pt to look over shoulder for progressing towards driving. PROGRESSING  3 Increase shoulder flexion AROM to at least 120 degrees to progress pt to reach for objects on high shelf/cupboard.  4 Improve SPADI to <75% indicating decreased pain and improved function.  5 Pt will report no > 5/10 w/ ROM. PROGRESSING    Long Term Goals: To be met by 12 weeks  6. Pt will report >90% decreased sensation of Rt shoulder dislocation.  7. Pt will have >130 deg Rt shoulder flexion to allow pt to reach into cabinets.  8. Pt will have at least 4/5 strength in Rt shoulder to allow pt to do IADLs w/o difficulty.  9. SPADI score will < 25% indicating decreased pain and improved function.  10. Pt will have no > 2/10 at worst while doing IADLs, ADLs etc.       Working Toward the Above Goals: (copied from last visit):      EXAMINATION:  Subjective Report: Pt forgot to take the bands with her for exercises while she was on vacation.  She did do ROM exercises and isometric exercises.  Pain:    At rest: 2/10; 3-4/10 in Rt shoulder w/ therex  Objective Findings today: functional IR to Rt gluteals (w/o any effort)  INTERVENTION:  Treatment Performed:  MODALITIES  In order to Decrease pain  ELECTRICAL  STIMULATION  Pre-Modulated to right shoulder for 10 minutes with COLD PACK to shoulder   MANUAL THERAPY: PROM w/ gentle stretch into all direction; ER at 45 deg and 90 deg abd (gentle) oscillations;  Rt scapula mobs in all direction w/ ant elevation and post depression of Rt scapula.    THERAPEUTIC EXERCISES: 1:1 per exercise flow sheet to improve flexibility, ROM, strength, stability and posture. VCs and TCs given to perform exercises correctly. Emphasis on not hiking shoulder up w/ horizontal abd, scap  protraction and chest press.  Progressed w/ therex per flow sheet to focus on strengthening Rt shoulder.      Exercise Specifics Date 6/6 Date6/9 Date 6/13 Date  6/23 Date6/27 Date 6/29 7/6 7/18   Supine flxn PROM x10-4 x10-2  Cane: x10-2 x10-2  Cane x10-2 AAROM x10-3 w/ cane x10-2 x10-1 x15 cane  AROM: x10 #1 x10-2   Supine abd PROM x10-4 x10-2  Cane x10-2 x10-2 AAROM x10-3 w/ cane x10  Side lying: x10 AAROM Side lying: x10-2 AAROM.   x10-2 CGA #1 x10-2   ER PROM  x10-4 x10-2  Cane x10-2 x10-2 x10-2 x10-2 at 45 deg x10-2 x10-2 x10-2 at 45 and 90 deg   Ext to neutral prone x10-2 x10-2 x10-2 AAROM x10-2 seated rows x10-2 (YTB) x10-2 x10 (standing) -   pendulum Prone cw/ccw x10-2 x10-2 x10-2 x10-2 Supine scap protraction x10-2 (cane + min A) Cane x10 -2 Cane x10-2 #1 x10-2   pendulum Prone abd/add x10-2 row (neutral)  x10-2 x10-2 - elbow flexion: Lt #1 x12-2 W/ YTB x10-2 - #2 x10-2   pulleys flxn x10-2 (seated) - - - x10  abd x10 - -    flx/ext isometric x10 x10 x10 - x10 - YTB x10 YTB x10 -2   abd/add Isometric  x10 x10 x10 - x10 - YTB x10 YTB x10-2   IR/ER isometric x10 x10 x10 - x10 - YTB x10 YTB x10-2   Chest press supine - - cane x10-2 - x10-2 x10-2 x10-2 #1 x10-2   Scapular protraction supine - - AAROM x10-2 - x10-2 sied lying: Rt horizontal abd w/ assist x10-2 x10-2 CGA Row x10-2 YTB   Stabilization drills 90 deg shoulder flxn - - W/ PT supporting arm - - x2 sets x10-2 -   UBE Fwd/retro seat 2 - - - - - - 4'/0' 4'/0'     Patient Education:   Patient was educated on precautions w/ carrying, supporting, lifting etc.   Pt to start green thera putty for Rt hand/finger strengthening. Patient did verbalize and demo an understanding of this education.       EVALUATION AND DIAGNOSIS:   [name] is a 65 y.o. female presents to physical therapy today s/p Rt reverse  shoulder replacement.  Good challenge w/ therex today.  No increase in Rt shoulder pain post exercises today.    Impairments in body function and structure:  Decreased PROM: progressing  Decreased AROM: progressing  Decreased Strength   Pain  Impaired posture  See above review of systems for details.    Activity Limitations / Restricted Participation:  Required assistance with ADL's: difficulty w/ washing hair, dressing: progressing  Required asssitance with IADL's: not able to use Rt UE for any activities: progressing  Unable to perform light household duties daily: not able to use Rt UE: progressing  Not driving due to Rt arm in sling. Resolved.    PLAN:   Patient requires continued skilled intervention in order to decrease  pain. and meet above mentioned functional goals.  Focus on Rt shoulder ROM, progress per protocol, measure shoulder ROM next visit, work on Rt shoulder strengthening exercises.    Therapist Signature:    [name], PT, MPT [phone]  Outpatient Speciality Rehab  Physical Medicine and Rehabilitation  [facility]  P: [phone]     04/30/2016

## 2016-05-01 ENCOUNTER — Ambulatory Visit: Payer: Medicare Other

## 2016-05-02 ENCOUNTER — Ambulatory Visit: Payer: Medicare Other

## 2016-05-02 DIAGNOSIS — M25511 Pain in right shoulder: Secondary | ICD-10-CM

## 2016-05-02 DIAGNOSIS — R29898 Other symptoms and signs involving the musculoskeletal system: Secondary | ICD-10-CM

## 2016-05-02 DIAGNOSIS — Z96611 Presence of right artificial shoulder joint: Secondary | ICD-10-CM

## 2016-05-02 NOTE — Progress Notes (Signed)
[facility]  [address], [address]  [place], [place]  [address]  Phone:  [phone]  Fax:  [phone]    PHYSICAL THERAPY DAILY TREATMENT NOTE    PATIENT: Leslie Gross DOB: Dec 26, 1950   MR #: [id number]  AGE: 65 y.o.    FACILITY PROVIDER #: [id number] PRIMARY MD: Lana Fish, MD    HICN# Medicare Sub. 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  [name], PT 05/02/2016  Certification Period:  02/28/16 to 05/30/16    Treatment Diagnosis:  Neck Pain [M54.2], Decreased Range of Motion in shoulder [M25.611], Acute shoulder Pain M25.511] and shoulder weakness M62.81    Date of onset: > 2 years  Date of surgery: 02/12/16  Imaging Performed: Yes--result At surgeon's office.    Precautions: Reverse total shoulder replacement protocol. No ER, extension stretch or strengthening until OK by MD.   Medications listed in EMR:  has a current medication list which includes the following prescription(s): amitriptyline, calcium citrate-vitamin d, carvedilol, cyanocobalamin, cyclosporine, diphenhydramine-acetaminophen, ergocalciferol, lactobacillus, levothyroxine, lisinopril, mesalamine, multivitamin, and pantoprazole. (see scanned list in chart for detailed list)  Patient/Caregiver does not report changes to medication at this time.  Allergies: shellfish-derived products, diflucan, tape (blisters)    Goals:  Short Term Goals: To be met by 6 weeks  1 Pt will be 100% compliant w/ HEP to increase ROM, strength. MET  2 Increase cervical rot'n to WNL to allow pt to look over shoulder for progressing towards driving. PROGRESSING  3 Increase shoulder flexion AROM to at least 120 degrees to progress pt to reach for objects on high shelf/cupboard.  4 Improve SPADI to <75% indicating decreased pain and improved function.  5 Pt will report no > 5/10 w/ ROM. PROGRESSING    Long Term Goals: To be met by 12 weeks  6. Pt will report >90% decreased sensation of Rt shoulder dislocation.  7. Pt will have >130 deg Rt shoulder flexion to allow pt to reach into cabinets.  8. Pt will have at least 4/5 strength in Rt shoulder to allow pt to do IADLs w/o difficulty.  9. SPADI score will < 25% indicating decreased pain and improved function.  10. Pt will have no > 2/10 at worst while doing IADLs, ADLs etc.       Working Toward the Above Goals: (copied from last visit):      EXAMINATION:  Subjective Report: Pt forgot to take her meds (pre-medications) for today's dental procedure at 12 so she would like to leave 15 min early to get home.    Pain:    At rest: 2/10; 3-4/10 in Rt shoulder w/ therex  Objective Findings today:   Supine PROM: flxn: 126 deg, abd 105 deg.  INTERVENTION:  Treatment Performed:  MANUAL THERAPY: PROM w/ gentle stretch into all direction; ER at 45 deg  and 90 deg abd (gentle) oscillations;  Rt scapula mobs in all direction w/ ant elevation and post depression of Rt scapula.    THERAPEUTIC EXERCISES: 1:1 per exercise flow sheet to improve flexibility, ROM, strength, stability and posture. VCs and TCs given to perform exercises correctly. Emphasis on not hiking shoulder up w/ horizontal abd, scap protraction and chest press.  Progressed w/ therex per flow sheet to focus on strengthening Rt shoulder.      Exercise Specifics Date 6/6 Date6/9 Date 6/13 Date  6/23 Date6/27 Date 6/29 7/6 7/18 7/20    Supine flxn PROM x10-4 x10-2  Cane: x10-2 x10-2  Cane x10-2 AAROM x10-3 w/ cane x10-2 x10-1 x15 cane  AROM: x10 #1 x10-2 PROM x10-2; AROM x10-2   Supine abd PROM x10-4 x10-2  Cane x10-2 x10-2 AAROM x10-3 w/ cane x10  Side lying: x10 AAROM Side lying: x10-2 AAROM.   x10-2 CGA #1 x10-2 PROM x10-2; AROM x10-2   ER PROM  x10-4 x10-2  Cane x10-2 x10-2 x10-2 x10-2 at 45 deg x10-2 x10-2 x10-2 at 45 and 90 deg x10-2   Ext to neutral prone x10-2 x10-2 x10-2 AAROM x10-2 seated rows x10-2 (YTB) x10-2 x10 (standing) - -   pendulum Prone cw/ccw x10-2 x10-2 x10-2 x10-2 Supine scap protraction x10-2 (cane + min A) Cane x10 -2 Cane x10-2 #1 x10-2 --   pendulum Prone abd/add x10-2 row (neutral)  x10-2 x10-2 - elbow flexion: Lt #1 x12-2 W/ YTB x10-2 - #2 x10-2 #2 x10-2   pulleys flxn x10-2 (seated) - - - x10  abd x10 - -  fxn X10; abd x10   flx/ext isometric x10 x10 x10 - x10 - YTB x10 YTB x10 -2 YTB x10   abd/add Isometric  x10 x10 x10 - x10 - YTB x10 YTB x10-2 YTB x10   IR/ER isometric x10 x10 x10 - x10 - YTB x10 YTB x10-2 YTB x10   Chest press supine - - cane x10-2 - x10-2 x10-2 x10-2 #1 x10-2 Chest press seated #5 x10 w/ min PT assist   Scapular protraction supine - - AAROM x10-2 - x10-2 sied lying: Rt horizontal abd w/ assist x10-2 x10-2 CGA Row x10-2 YTB Row x10 -2 YTB   Stabilization drills 90 deg shoulder flxn - - W/ PT supporting arm - - x2 sets x2 sets -    UBE Fwd/retro seat 2 - - - - - - 4'/0' 4'/0' 4'/0     Patient Education:   Patient was educated on precautions w/ carrying, supporting, lifting etc.   Pt to start green thera putty for Rt hand/finger strengthening. Patient did verbalize and demo an understanding of this education.       EVALUATION AND DIAGNOSIS:   Leslie Gross is a 66 y.o. female presents to physical therapy today s/p Rt reverse shoulder replacement.  Pt continues to  tighten up w/ manual therapy--especially PROM therefore it is difficult to progress w/ ROM.  Good challenge w/ today's exercises.  Most difficulty w/ resisted flxn and ER.  Pt will benefit from progressing w/ strengthening exercises HEP.    Impairments in body function and structure:  Decreased PROM: progressing  Decreased AROM: progressing  Decreased Strength   Pain  Impaired posture  See above review of systems for details.    Activity Limitations / Restricted Participation:  Required assistance with ADL's: difficulty w/ washing hair, dressing: progressing  Required asssitance with IADL's: not able to use Rt UE for any activities: progressing  Unable to  perform light household duties daily: not able to use Rt UE: progressing  Not driving due to Rt arm in sling. Resolved.    PLAN:   Patient requires continued skilled intervention in order to decrease pain and meet above mentioned functional goals.  Focus on Rt shoulder ROM, progress per protocol, work on Rt shoulder strengthening exercises, give updated HEP routine.    Therapist Signature:    [name], PT, MPT [phone]  Outpatient Speciality Rehab  Physical Medicine and Rehabilitation  [facility]  P: [phone]     05/02/2016

## 2016-05-03 ENCOUNTER — Ambulatory Visit: Payer: Medicare Other

## 2016-05-06 ENCOUNTER — Ambulatory Visit: Payer: Medicare Other

## 2016-05-07 ENCOUNTER — Ambulatory Visit: Payer: Medicare Other

## 2016-05-07 DIAGNOSIS — R29898 Other symptoms and signs involving the musculoskeletal system: Secondary | ICD-10-CM

## 2016-05-07 DIAGNOSIS — Z96611 Presence of right artificial shoulder joint: Secondary | ICD-10-CM

## 2016-05-07 NOTE — Progress Notes (Signed)
[facility]  [address]  [place], [place]  [address]  Phone:  [phone]  Fax:  [phone]    PHYSICAL THERAPY DAILY TREATMENT NOTE    PATIENT: Leslie Gross DOB: 10-23-1950   MR #: [id number]  AGE: 65 y.o.    FACILITY PROVIDER #: [id number] PRIMARY MD: Lana Fish, MD    HICN# Medicare Sub. 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  [name], PT 05/07/2016  Certification Period:  02/28/16 to 05/30/16    Treatment Diagnosis:  Neck Pain [M54.2], Decreased Range of Motion in shoulder [M25.611], Acute shoulder Pain M25.511] and shoulder weakness M62.81    Date of onset: > 2 years  Date of surgery: 02/12/16  Imaging Performed: Yes--result At surgeon's office.    Precautions: Reverse total shoulder replacement protocol. No ER, extension stretch or strengthening until OK by MD.   Medications listed in EMR:  has a current medication list which includes the following prescription(s): amitriptyline, calcium citrate-vitamin d, carvedilol, cyanocobalamin, cyclosporine, diphenhydramine-acetaminophen, ergocalciferol, lactobacillus, levothyroxine, lisinopril, mesalamine, multivitamin, and pantoprazole. (see scanned list in chart for detailed list)  Patient/Caregiver does not report changes to medication at this  time.  Allergies: shellfish-derived products, diflucan, tape (blisters)   Goals:  Short Term Goals: To be met by 6 weeks  1 Pt will be 100% compliant w/ HEP to increase ROM, strength. MET  2 Increase cervical rot'n to WNL to allow pt to look over shoulder for progressing towards driving. PROGRESSING  3 Increase shoulder flexion AROM to at least 120 degrees to progress pt to reach for objects on high shelf/cupboard.  4 Improve SPADI to <75% indicating decreased pain and improved function.  5 Pt will report no > 5/10 w/ ROM. PROGRESSING    Long Term Goals: To be met by 12 weeks  6. Pt will report >90% decreased sensation of Rt shoulder dislocation.  7. Pt will have >130 deg Rt shoulder flexion to allow pt to reach into cabinets.  8. Pt will have at least 4/5 strength in Rt shoulder to allow pt to do IADLs w/o difficulty.  9. SPADI score will < 25% indicating decreased pain and improved function.  10. Pt will have no > 2/10 at worst while doing IADLs, ADLs etc.       Working Toward the Above Goals: (copied from last visit):      EXAMINATION:  Subjective Report: Pt forgot to take her meds (pre-medications) for today's dental procedure at 12 so she would like to leave 15 min early to get home.    Pain:    At rest: 2/10; 3-4/10 in Rt shoulder w/ therex  Objective Findings today:   Supine PROM: flxn: 126 deg, abd 105 deg.  INTERVENTION:  Treatment Performed:  MANUAL THERAPY: PROM w/  gentle stretch into all direction; ER at 45 deg and 90 deg abd (gentle) oscillations;  Rt scapula mobs in all direction w/ ant elevation and post depression of Rt scapula.    THERAPEUTIC EXERCISES: 1:1 per exercise flow sheet to improve flexibility, ROM, strength, stability and posture. VCs and TCs given to perform exercises correctly. Emphasis on not hiking shoulder up w/ horizontal abd, scap protraction and chest press.  Progressed w/ therex per flow sheet to focus on strengthening Rt shoulder.      Exercise Specifics Date 7/25 Date  Date Date Date Date   UBE UBE/Fwd 4'/1'        Flx Resisted bil YTB x10        ER resisted YTB x10-2        abd "       " YTB x10        IR "              " YTB x10-2        add "                " YTB x10-2        row "             " YTB x10-2        Ext "            "       YTB x10-2        pulleys Flxn/abd/scaption x10        Supine flxn weights #1 x10        Side lying abd weights #1 x10        Scapular protraction weights #2 x10        Chest press Supine wts #2 x10        Elbow flxn seated #2 x10-2        Flxn/abd/IR/ER PROM         Shoulder stretch Over head/flxn; over to Lt shldr         pendulum Rt X          Patient Education:   Patient was educated on precautions w/ carrying, supporting, lifting etc.   Pt to not use weights . Patient did verbalize and demo an understanding of this education.       EVALUATION AND DIAGNOSIS:   Leslie Gross is a 65 y.o. female presents to physical therapy today s/p Rt reverse shoulder replacement.  Pt is progressing well with ROM, pt relaxes and allowing gentle stretch of Rt shoulder.  Good challenge w/ today's exercises.  Most difficulty w/ resisted flxn and ER.  Pt will benefit from progressing w/ strengthening exercises HEP--pt not able to do perform exercises correctly today and will require more practice.    Impairments in body function and structure:  Decreased PROM: progressing  Decreased AROM: progressing  Decreased Strength   Pain  Impaired posture  See above review of systems for details.    Activity Limitations / Restricted Participation:  Required assistance with ADL's: difficulty w/ washing hair, dressing: progressing  Required asssitance with IADL's: not able to use Rt UE for any activities: progressing  Unable to perform light household duties daily: not able to use Rt UE: progressing  Not driving due to Rt arm in sling. Resolved.    PLAN:   Patient requires continued skilled intervention in order to decrease pain and meet above mentioned functional  goals.  Focus on  Rt shoulder ROM, progress per protocol, work on Rt shoulder strengthening exercises, give updated HEP routine next visit..    Therapist Signature:    [name], PT, MPT [phone]  Outpatient Speciality Rehab  Physical Medicine and Rehabilitation  [facility]  P: [phone]     05/07/2016

## 2016-05-08 ENCOUNTER — Ambulatory Visit: Payer: Medicare Other

## 2016-05-09 ENCOUNTER — Ambulatory Visit: Payer: Medicare Other

## 2016-05-09 DIAGNOSIS — Z96611 Presence of right artificial shoulder joint: Secondary | ICD-10-CM

## 2016-05-09 DIAGNOSIS — R29898 Other symptoms and signs involving the musculoskeletal system: Secondary | ICD-10-CM

## 2016-05-09 DIAGNOSIS — M2560 Stiffness of unspecified joint, not elsewhere classified: Secondary | ICD-10-CM
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
## 2016-05-10 ENCOUNTER — Ambulatory Visit: Payer: Medicare Other

## 2016-05-14 ENCOUNTER — Ambulatory Visit: Payer: Medicare Other | Attending: Orthopaedic Surgery

## 2016-05-14 DIAGNOSIS — Z96611 Presence of right artificial shoulder joint: Secondary | ICD-10-CM | POA: Insufficient documentation

## 2016-05-14 DIAGNOSIS — R29898 Other symptoms and signs involving the musculoskeletal system: Secondary | ICD-10-CM

## 2016-05-14 DIAGNOSIS — M542 Cervicalgia: Secondary | ICD-10-CM | POA: Insufficient documentation

## 2016-05-14 DIAGNOSIS — M25611 Stiffness of right shoulder, not elsewhere classified: Secondary | ICD-10-CM | POA: Insufficient documentation

## 2016-05-14 DIAGNOSIS — M6281 Muscle weakness (generalized): Secondary | ICD-10-CM | POA: Insufficient documentation

## 2016-05-14 DIAGNOSIS — M25511 Pain in right shoulder: Secondary | ICD-10-CM | POA: Insufficient documentation
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
## 2016-05-16 ENCOUNTER — Ambulatory Visit: Payer: Medicare Other

## 2016-05-17 ENCOUNTER — Ambulatory Visit: Payer: Medicare Other

## 2016-05-17 ENCOUNTER — Other Ambulatory Visit: Payer: Self-pay | Admitting: Internal Medicine

## 2016-05-17 DIAGNOSIS — R109 Unspecified abdominal pain: Secondary | ICD-10-CM

## 2016-05-21 ENCOUNTER — Ambulatory Visit
Admission: RE | Admit: 2016-05-21 | Discharge: 2016-05-21 | Disposition: A | Discharge status: Home / Self Care | Payer: Medicare Other | Source: Ambulatory Visit | Attending: Internal Medicine | Admitting: Internal Medicine

## 2016-05-21 ENCOUNTER — Ambulatory Visit: Payer: Medicare Other

## 2016-05-21 DIAGNOSIS — R109 Unspecified abdominal pain: Secondary | ICD-10-CM | POA: Insufficient documentation

## 2016-05-21 DIAGNOSIS — R29898 Other symptoms and signs involving the musculoskeletal system: Secondary | ICD-10-CM

## 2016-05-21 DIAGNOSIS — Z96611 Presence of right artificial shoulder joint: Secondary | ICD-10-CM

## 2016-05-21 NOTE — Discharge Summary (Signed)
[facility]  [address], [address]  [place], [place]  [address]  Phone:  [phone]  Fax:  [phone]    PHYSICAL THERAPY DAILY TREATMENT and DISCHARGE NOTE    PATIENT: Leslie Gross DOB: 1951/01/22   MR #: [id number]  AGE: 65 y.o.    FACILITY PROVIDER #: [id number] PRIMARY MD: Lana Fish, MD    HICN# Medicare Sub. 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]      Date of Service PT Received On: 05/21/16   Treatment Time Start Time: 1000 to Stop Time: 1100   Time Calculation Time Calculation (min): 60 min   Visit # PT Visit  PT Visit Number: 19   Units Billed   Therapeutic Interventions  $ PT Electrical Stimulation- Unattended (O5366): 1 Procedure  $ PT Therapeutic Exercise (97110): 1 Unit  $ PT Manual Therapy (97140): 1 Unit  $ PT Therapeutic Activity (97530): 1 unit     Leslie Gross referred for physical therapy services by: Diona Fanti, MD    Certification period, precautions, medications and allergies and goals copied from initial evaluation - reviewed and reconciled today.  [name], PT 05/23/2016  Certification Period:  02/28/16 to 05/30/16    Treatment Diagnosis:  Neck Pain [M54.2], Decreased Range of Motion in shoulder [M25.611], Acute shoulder Pain M25.511] and shoulder weakness M62.81    Date of onset: > 2 years  Date of surgery: 02/12/16  Imaging Performed: Yes--result At surgeon's office.    Precautions: Reverse total shoulder replacement protocol. No ER, extension stretch or strengthening until OK by MD.   Medications listed in EMR:  has a current medication list which includes the following prescription(s): amitriptyline, calcium citrate-vitamin d, carvedilol, cyanocobalamin, cyclosporine, diphenhydramine-acetaminophen, ergocalciferol, lactobacillus, levothyroxine, lisinopril, mesalamine, multivitamin, and pantoprazole. (see scanned list in chart for detailed list)  Patient/Caregiver does  not report changes to medication at this time.  Allergies: shellfish-derived products, diflucan, tape (blisters)   Goals:  Short Term Goals: To be met by 6 weeks  1 Pt will be 100% compliant w/ HEP to increase ROM, strength. MET  2 Increase cervical rot'n to WNL to allow pt to look over shoulder for progressing towards driving. MET  3 Increase shoulder flexion AROM to at least 120 degrees to progress pt to reach for objects on high shelf/cupboard. MET  4 Improve SPADI to <75% indicating decreased pain and improved function. MET  5 Pt will report no > 5/10 w/ ROM. MET    Long Term Goals: To be met by 12 weeks  6. Pt will report >90% decreased sensation of Rt shoulder dislocation. MET  7. Pt will have >130 deg Rt shoulder flexion to allow pt to reach into cabinets. MET  8. Pt will have at least 4/5 strength in Rt shoulder to allow pt to do IADLs w/o difficulty. Progressing  9. SPADI score will < 25% indicating decreased pain and improved function.  MET  10. Pt will have no > 2/10 at worst while doing IADLs, ADLs etc. MET      Working Toward the Above Goals: (copied from last visit):    EXAMINATION:  Subjective Report: Pt reports that her shoulder has been sore this past week and thinks it may be from over stretching the shoulder w/ the pulley.  Pt reports that she is so happy that she can sleep through the night now--hasn't been able to do in years due to shoulder pain.  Level of function at eval  Current functional level   Required assistance with ADL's: difficulty w/ washing hair, dressing   Required asssitance with IADL's: not able to use Rt UE for any activities  Unable to perform light household duties daily: not able to use Rt UE  Not driving due to Rt arm in sling. No assistance w/ ADLs except unable to wash back or hook bra behind back w/ Rt.  Able to perform light house hold duties daily.  Not able to lift overhead --heavy objects; or lift heavy objects off floor     Pain:    At rest: 0/10; 1/10  in Rt shoulder at worst  Objective Findings :     INITIAL EVAL   AROM INITIAL EVAL PROM AROM today Supine PROM MMT Rt shoulder   Lt shoulder IR/ER, flxn, abd all: full Lt shoulder not assessed Lt IR: into trunk; functional: Rt post GT - Isometric IR: fair   Rt shoulder: ER (-) 35 deg Rt shoulder: ER (-) 25 deg ER: 22 deg; T2 functional ER: 30 deg Isometric ER: fair w/ pain   Flxn: 20 deg Flxn: 25 deg Seated: flxn 135 deg; Seated: flxn 138 Flxn: 4-/5; ext: 3+/5 (modified)   Abd: 10 deg Abd: 10 deg Abd: 121 deg Abd: 128 deg Abd: 4/5   Outcomes:   INITIAL EVAL  05/21/16   SPADI: 94.5% SPADI: 16.75%     INTERVENTION:  Treatment Performed:  MANUAL THERAPY: PROM w/ gentle stretch into all direction; ER at 45 deg and 90 deg abd (gentle) oscillations;   THERAPEUTIC EXERCISES: 1:1 per exercise flow sheet to improve flexibility, ROM. HEP routine.  Modalities: Electrical stim: P to Rt shoulder w/ MH 10' post tx for pain relief, decreasing guarding.  THERAPEUTIC ACTIVITY: measurements completed; completed SPADI; functional level review.    Exercise Specifics Date 7/25 Date 7/27 Date  8/1 Date 8/8 Date Date   UBE UBE/Fwd 4'/1' 4'/1' 5' 5'     Flx Resisted bil YTB x10 YTB x12 YTB x15      ER resisted YTB x10-2 YTB x12, x10 YTB x15      abd "       " YTB x10 YTB x12 YTB x15      IR "              " YTB x10-2 YTB x12, x10 YTB x15      add "                " YTB x10-2 YTB x12, x10 YTB x15      row "             " YTB x10-2 YTB x12, x10 YTB x15      Ext "            "       YTB x10-2 YTB x12, x10 YTB x15      pulleys Flxn/abd/scaption x10 x12 each HEP x15 each     Supine flxn weights #1 x10 #1 x10-2 #2 x10      Side lying abd weights #1 x10 #1 x10-2 #2 x10      Scapular protraction weights #2 x10 - #2 x15      Chest press Supine wts #2 x10 #2 x10-2 #2 x15      Elbow flxn seated #2 x10-2 #2 x10-2 #2 x15      Flxn/abd/IR/ER PROM x x -      Shoulder stretch  Over head/flxn; over to Lt shldr x x -      pendulum Rt X -  Side lying ER: # x10        Patient Education:   Patient was educated on precautions w/ carrying, supporting, lifting etc.   Pt to not lift heavy boxes  Patient did verbalize and demo an understanding of this education.       EVALUATION AND DIAGNOSIS:   Leslie Gross is a 65 y.o. female presents to physical therapy today s/p Rt reverse shoulder replacement.  Good challenge w/ all exercises today.  Pt continues to have weakness in Rt shoulder and she understands the need to continue w/ the HEP routine which mainly focus on strengthening.  Pt understands the exercise progressions.  Pt has met most LTGs and has achieved maximum benefit from skilled PT intervention.  I would like to discharge pt from physical therapy at this time.    Impairments in body function and structure:  Decreased Strength   Pain: progressing    PLAN:   Discharge from PT.    Therapist Signature:    [name], PT, MPT [phone]  Outpatient Speciality Rehab  Physical Medicine and Rehabilitation  [facility]  P: [phone]     05/23/2016

## 2016-05-23 ENCOUNTER — Ambulatory Visit: Payer: Medicare Other

## 2016-05-28 ENCOUNTER — Ambulatory Visit: Payer: Medicare Other

## 2016-05-30 ENCOUNTER — Ambulatory Visit: Payer: Medicare Other

## 2016-06-04 ENCOUNTER — Ambulatory Visit: Payer: Medicare Other

## 2016-06-06 ENCOUNTER — Ambulatory Visit: Payer: Medicare Other

## 2016-06-11 ENCOUNTER — Ambulatory Visit: Payer: Medicare Other

## 2016-06-28 ENCOUNTER — Telehealth: Payer: Self-pay

## 2016-11-27 ENCOUNTER — Ambulatory Visit: Payer: Medicare Other | Attending: Gastroenterology

## 2016-11-27 NOTE — Pre-Procedure Instructions (Signed)
Low risk no preops

## 2016-12-06 ENCOUNTER — Ambulatory Visit: Payer: Medicare Other | Admitting: Gastroenterology

## 2016-12-06 ENCOUNTER — Ambulatory Visit
Admission: RE | Admit: 2016-12-06 | Discharge: 2016-12-06 | Disposition: A | Discharge status: Home / Self Care | Payer: Medicare Other | Source: Ambulatory Visit | Attending: Gastroenterology | Admitting: Gastroenterology

## 2016-12-06 ENCOUNTER — Ambulatory Visit: Payer: Medicare Other | Admitting: Pain Medicine

## 2016-12-06 ENCOUNTER — Encounter
Admission: RE | Disposition: A | Discharge status: Home / Self Care | Payer: Self-pay | Source: Ambulatory Visit | Attending: Gastroenterology

## 2016-12-06 DIAGNOSIS — I341 Nonrheumatic mitral (valve) prolapse: Secondary | ICD-10-CM | POA: Insufficient documentation

## 2016-12-06 DIAGNOSIS — R112 Nausea with vomiting, unspecified: Secondary | ICD-10-CM | POA: Insufficient documentation

## 2016-12-06 DIAGNOSIS — K6289 Other specified diseases of anus and rectum: Secondary | ICD-10-CM | POA: Insufficient documentation

## 2016-12-06 DIAGNOSIS — K5730 Diverticulosis of large intestine without perforation or abscess without bleeding: Secondary | ICD-10-CM | POA: Insufficient documentation

## 2016-12-06 DIAGNOSIS — R1013 Epigastric pain: Secondary | ICD-10-CM | POA: Insufficient documentation

## 2016-12-06 DIAGNOSIS — R1310 Dysphagia, unspecified: Secondary | ICD-10-CM

## 2016-12-06 DIAGNOSIS — H409 Unspecified glaucoma: Secondary | ICD-10-CM | POA: Insufficient documentation

## 2016-12-06 DIAGNOSIS — K2950 Unspecified chronic gastritis without bleeding: Secondary | ICD-10-CM | POA: Insufficient documentation

## 2016-12-06 DIAGNOSIS — K5180 Other ulcerative colitis without complications: Secondary | ICD-10-CM | POA: Insufficient documentation

## 2016-12-06 DIAGNOSIS — K219 Gastro-esophageal reflux disease without esophagitis: Secondary | ICD-10-CM | POA: Insufficient documentation

## 2016-12-06 DIAGNOSIS — Z9884 Bariatric surgery status: Secondary | ICD-10-CM | POA: Insufficient documentation

## 2016-12-06 DIAGNOSIS — E039 Hypothyroidism, unspecified: Secondary | ICD-10-CM | POA: Insufficient documentation

## 2016-12-06 DIAGNOSIS — K648 Other hemorrhoids: Secondary | ICD-10-CM | POA: Insufficient documentation

## 2016-12-06 DIAGNOSIS — K529 Noninfective gastroenteritis and colitis, unspecified: Secondary | ICD-10-CM

## 2016-12-06 HISTORY — PX: COLONOSCOPY: SHX174

## 2016-12-06 HISTORY — PX: EGD, DILATION: SHX3804

## 2016-12-06 SURGERY — ESOPHAGOGASTRODUODENOSCOPY (EGD), ESOPHAGEAL DILATION
Anesthesia: Anesthesia General | Site: Anus | Wound class: Clean Contaminated

## 2016-12-06 MED ORDER — SODIUM CHLORIDE 0.9 % IV SOLN
INTRAVENOUS | Status: DC
Start: 2016-12-06 — End: 2016-12-06

## 2016-12-06 MED ORDER — LACTATED RINGERS IV SOLN
INTRAVENOUS | Status: DC
Start: 2016-12-06 — End: 2016-12-06

## 2016-12-06 MED ORDER — FENTANYL CITRATE (PF) 50 MCG/ML IJ SOLN (WRAP)
INTRAMUSCULAR | Status: AC
Start: 2016-12-06 — End: ?
  Filled 2016-12-06: qty 2

## 2016-12-06 MED ORDER — PROPOFOL 10 MG/ML IV EMUL (WRAP)
INTRAVENOUS | Status: AC
Start: 2016-12-06 — End: ?
  Filled 2016-12-06: qty 20

## 2016-12-06 MED ORDER — LIDOCAINE HCL 2 % IJ SOLN
INTRAMUSCULAR | Status: DC | PRN
Start: 2016-12-06 — End: 2016-12-06
  Administered 2016-12-06: 100 mg

## 2016-12-06 MED ORDER — GLYCOPYRROLATE 0.2 MG/ML IJ SOLN
INTRAMUSCULAR | Status: DC | PRN
Start: 2016-12-06 — End: 2016-12-06
  Administered 2016-12-06: 0.2 mg via INTRAVENOUS

## 2016-12-06 MED ORDER — PROPOFOL INFUSION 10 MG/ML
INTRAVENOUS | Status: DC | PRN
Start: 2016-12-06 — End: 2016-12-06
  Administered 2016-12-06: 50 mg via INTRAVENOUS
  Administered 2016-12-06: 70 mg via INTRAVENOUS
  Administered 2016-12-06 (×2): 30 mg via INTRAVENOUS

## 2016-12-06 MED ORDER — FENTANYL CITRATE (PF) 50 MCG/ML IJ SOLN (WRAP)
INTRAMUSCULAR | Status: DC | PRN
Start: 2016-12-06 — End: 2016-12-06
  Administered 2016-12-06: 50 ug via INTRAVENOUS

## 2016-12-06 SURGICAL SUPPLY — 49 items
BITE BLOCK MAXI 60F LATEX FREE (Procedure Accessories) ×1
BLOCK BITE OD60 FR STURDY STRAP SIDEPORT (Procedure Accessories) ×2 IMPLANT
BLOCK BITE OD60 FR STURDY STRAP SIDEPORT DENTAL RETENTION RIM MAXI (Procedure Accessories) ×2 IMPLANT
FORCEPS BIOPSY L240 CM JUMBO MICROMESH (Instrument) IMPLANT
FORCEPS BIOPSY L240 CM JUMBO MICROMESH TEETH STREAMLINE CATHETER (Instrument) IMPLANT
FORCEPS BIOPSY L240 CM LARGE CAPACITY (Instrument) ×2 IMPLANT
FORCEPS BIOPSY L240 CM MICROMESH TEETH STREAMLINE CATHETER NEEDLE (Instrument) ×2 IMPLANT
FORCEPS JAW RADIAL JUMBO (Instrument)
FORCEPS RAD JAW 4 BIOSPY W/NDL (Instrument) ×1
GAUZE SPONGE 4X4 NS (Dressing) ×1
GLOVES EXAM NITRILE ETS LG NS (Glove) ×6 IMPLANT
GOWN CP ELSTC WRIST REG/LG BL (Gown) ×2
GOWN ISL PP PE REG LG LF FULL BCK NK TIE (Gown) ×4 IMPLANT
GOWN ISOLATION REGULAR LARGE FULL BACK NECK TIE ELASTIC CUFF (Gown) ×4 IMPLANT
JELLY LUB EZ LF STRL H2O SOL NGRS TRNLU (Irrigation Solutions) ×2 IMPLANT
JELLY LUBE STRL FLPTOP 4OZ (Irrigation Solutions) ×1
KIT UNIVERSAL IRRIGATION SOL (Kits) ×3 IMPLANT
MARKER ENDOSCOPIC PERMANENT INDICATION (Syringes, Needles) IMPLANT
MARKER ENDOSCOPIC PERMANENT INDICATION DARK SYRINGE SPOT EX 5 ML (Syringes, Needles) IMPLANT
MASK FLUID SHIELD W WRAP (Personal Protection) ×6 IMPLANT
NEEDLE CARR-LOCKE INJECT 25GX5 (Needles) IMPLANT
PAD ELECTROSRG GRND REM W CRD (Procedure Accessories) IMPLANT
PROBE ELECTROSURGICAL L220 CM FLEXIBLE (Procedure Accessories) IMPLANT
PROBE ELECTROSURGICAL L220 CM FLEXIBLE STRAIGHT FIRE OD2.3 MM FIAPC (Procedure Accessories) IMPLANT
PROBE FIAPC STRGHT FIRE 220CM (Procedure Accessories)
SNARE CAPTIVATOR 13MMX240CM (GE Lab Supplies)
SNARE ELECTRO SURG 20MM (Procedure Accessories) IMPLANT
SNARE ESCP MIC CPTVTR 13MM 240IN STRL (GE Lab Supplies) IMPLANT
SNARE SENSATION OVAL 27MM (Endoscopic Supplies) IMPLANT
SNARE SMALL HEXAGON CAPTIVATOR STIFF ENDOSCOPIC POLYPECTOMY (GE Lab Supplies) IMPLANT
SOL WATER STERILE 1000CC BTLE (Irrigation Solutions) ×1
SPONGE GAUZE L4 IN X W4 IN 16 PLY (Dressing) ×2 IMPLANT
SPONGE GAUZE L4 IN X W4 IN 16 PLY MAXIMUM ABSORBENT USP TYPE VII (Dressing) ×2 IMPLANT
SYRING SPOT EX ENDO MARK 5CC (Syringes, Needles)
SYRINGE 50 ML GRADUATE NONPYROGENIC DEHP (Syringes, Needles) ×2 IMPLANT
SYRINGE 50 ML GRADUATE NONPYROGENIC DEHP FREE PVC FREE BD MEDICAL (Syringes, Needles) ×2 IMPLANT
SYRINGE INFLATION 60 ML GAUGE CRE (Syringes, Needles) IMPLANT
SYRINGE INFLATION GAUGE 60CC (Syringes, Needles) IMPLANT
SYRINGE SLIP-TIP 60CC (Syringes, Needles) ×1
TRAP MUCUS SCREW CAP TUBE ID LABEL (Procedure Accessories) IMPLANT
TRAP MUCUS SCREW CAP TUBE ID LABEL MEDLINE PLASTIC CLEAR (Procedure Accessories) IMPLANT
TRAP MUCUS SPEC 40CC (Procedure Accessories)
TRAP SPECIMEN ETRAP POLYP 15CM (Procedure Accessories)
TRAP SPECIMEN REMOVAL L15 CM MAGNIFY (Procedure Accessories) IMPLANT
TRAP SPECIMEN REMOVAL L15 CM MAGNIFY WINDOW MEASUREMENT GUIDE ETRAP (Procedure Accessories) IMPLANT
WATER STERILE PLASTIC POUR BOTTLE 1000 (Irrigation Solutions) ×2 IMPLANT
WATER STERILE PLASTIC POUR BOTTLE 1000 ML (Irrigation Solutions) ×2 IMPLANT
WATER STERILE PLASTIC POUR BOTTLE 250 ML (Irrigation Solutions) ×2 IMPLANT
WATER STRL IRRIG 250ML BTL (Irrigation Solutions) ×1

## 2016-12-06 NOTE — Transfer of Care (Signed)
Pt awake, breathing spontaneously, VSS.  Report given to RN.
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
## 2016-12-06 NOTE — Discharge Instr - AVS First Page (Addendum)
Reason for your Hospital Admission:      Instructions for after your discharge:    -continue Pepcid 20 mg twice a day  -will add carafate liquids three times a day  -continue lialda  -will send letter for results in 3-4 weeks  -follow up with Dr. Sherryll Burger after above        EGD Discharge Instructions  General Instructions:  1. Following sedation, your judgement, perception, and coordination are considered impaired. Even though you may feel awake and alert, you are considered legally intoxicated. Therefore, until the next morning;   Do not Drive   Do not operate appliances or equipment that requires reaction time (e.g.    Stove, electrical tools, machinery)   Do not sign legal documents or be involved in important decisions.   Do not smoke if alone   Do not drink alcoholic beverages   Go directly home and rest for several hours before resuming your routine    activities.   It is highly recommended to have a responsible adult stay with you for the   next 24 hours    2. Tenderness, swelling or pain may occur at the IV site where you received sedation. If you experience this, apply warm soaks to the area. Notify your physician if this persists.    Instructions Specific To Procedures - Report To Physician Any Of The Following:    Upper Endoscopy, ERCP, Dilations   1. Pain in Chest   2. Nausea/vomitting   3. Fevers/Chills within 24 hours after procedure. Temp>101deg F   4. Severe and persistent abdominal pain and bloating     In Addition:   Mild throat soreness may follow this procedure. Warm salt water gargling or   lozenges of your choice will most likely relieve your discomfort or cold drinks and   popsicles.       Additional Discharge Instructions  Your diet after the procedure: {Gi diet instructions:110015}   NO RED FLUIDS, FOODS OR SAUCES FOR 24 HOURS.    Special Instructions: ***  Prescriptions given: {YES (DEF)/NO:21773}  Patient education literature given; {YES (DEF)/NO:21773}      If you have questions or  problems contact your MD immediately. If you need immediate attention, call your MD, 911 and/or go to nearest emergency room.

## 2016-12-06 NOTE — Anesthesia Preprocedure Evaluation (Addendum)
Anesthesia Evaluation    AIRWAY    Mallampati: II    TM distance: >3 FB  Neck ROM: full     CARDIOVASCULAR    cardiovascular exam normal, regular and normal       DENTAL         PULMONARY    clear to auscultation     OTHER FINDINGS              Relevant Problems   No active problems are marked relevant to this note.       PSS Anesthesia Comments: Anemia, MVP, OSA, Obese, Hx of gastric bypass        Anesthesia Plan    ASA 3     general                     intravenous induction   Detailed anesthesia plan: general IV  Monitors/Adjuncts: other    Post Op: other      informed consent obtained                   Signed by: Juanell Fairly 12/06/16 9:44 AM

## 2016-12-06 NOTE — Anesthesia Postprocedure Evaluation (Signed)
Anesthesia Post Evaluation    Patient: Leslie Gross    Procedures performed: Procedure(s) with comments:  EGD, DILATION - EGD W/DILATION, COLONOSCOPY  q1=n  COLONOSCOPY    Anesthesia type: General TIVA    Patient location:Phase II PACU    Last vitals:   Vitals:    12/06/16 1045   BP: 99/68   Pulse: 72   Resp: 16   Temp:    SpO2: 94%       Post pain: Patient not complaining of pain, continue current therapy      Mental Status:awake    Respiratory Function: tolerating room air    Cardiovascular: stable    Nausea/Vomiting: patient not complaining of nausea or vomiting    Hydration Status: adequate    Post assessment: no apparent anesthetic complications    Signed by: Juanell Fairly, 12/06/2016 10:47 AM

## 2016-12-09 ENCOUNTER — Encounter: Payer: Self-pay | Admitting: Gastroenterology

## 2016-12-09 LAB — LAB USE ONLY - HISTORICAL SURGICAL PATHOLOGY

## 2017-03-14 DIAGNOSIS — Z9889 Other specified postprocedural states: Secondary | ICD-10-CM

## 2017-03-14 DIAGNOSIS — Z9289 Personal history of other medical treatment: Secondary | ICD-10-CM

## 2017-03-14 HISTORY — DX: Other specified postprocedural states: Z98.890

## 2017-03-14 HISTORY — DX: Personal history of other medical treatment: Z92.89

## 2017-03-18 ENCOUNTER — Ambulatory Visit (INDEPENDENT_AMBULATORY_CARE_PROVIDER_SITE_OTHER): Payer: Self-pay | Admitting: Cardiology

## 2017-03-18 DIAGNOSIS — R079 Chest pain, unspecified: Secondary | ICD-10-CM | POA: Insufficient documentation

## 2017-03-18 DIAGNOSIS — R002 Palpitations: Secondary | ICD-10-CM | POA: Insufficient documentation

## 2017-03-18 LAB — VAHRT HISTORIC LVEF: Ejection Fraction: 60 %

## 2017-03-19 ENCOUNTER — Other Ambulatory Visit: Payer: Self-pay | Admitting: Cardiology

## 2017-03-19 DIAGNOSIS — R079 Chest pain, unspecified: Secondary | ICD-10-CM

## 2017-03-26 ENCOUNTER — Ambulatory Visit (INDEPENDENT_AMBULATORY_CARE_PROVIDER_SITE_OTHER): Payer: Self-pay

## 2017-03-31 ENCOUNTER — Ambulatory Visit (INDEPENDENT_AMBULATORY_CARE_PROVIDER_SITE_OTHER): Payer: Self-pay | Admitting: Cardiology

## 2017-03-31 ENCOUNTER — Inpatient Hospital Stay
Admission: RE | Admit: 2017-03-31 | Discharge: 2017-03-31 | Disposition: A | Discharge status: Home / Self Care | Payer: Medicare Other | Source: Ambulatory Visit | Attending: Cardiology | Admitting: Cardiology

## 2017-03-31 ENCOUNTER — Other Ambulatory Visit (INDEPENDENT_AMBULATORY_CARE_PROVIDER_SITE_OTHER): Payer: Self-pay

## 2017-03-31 DIAGNOSIS — I429 Cardiomyopathy, unspecified: Secondary | ICD-10-CM | POA: Insufficient documentation

## 2017-03-31 DIAGNOSIS — R002 Palpitations: Secondary | ICD-10-CM | POA: Insufficient documentation

## 2017-03-31 DIAGNOSIS — I340 Nonrheumatic mitral (valve) insufficiency: Secondary | ICD-10-CM | POA: Insufficient documentation

## 2017-03-31 DIAGNOSIS — R079 Chest pain, unspecified: Secondary | ICD-10-CM | POA: Insufficient documentation

## 2017-03-31 MED ORDER — REGADENOSON 0.4 MG/5ML IV SOLN
0.4000 mg | Freq: Once | INTRAVENOUS | Status: AC | PRN
Start: 2017-03-31 — End: 2017-03-31
  Administered 2017-03-31: 12:00:00 0.4 mg via INTRAVENOUS
  Filled 2017-03-31: qty 5

## 2017-03-31 MED ORDER — TECHNETIUM TC 99M TETROFOSMIN INJECTION
1.0000 | Freq: Once | Status: AC | PRN
Start: 2017-03-31 — End: 2017-03-31
  Administered 2017-03-31: 11:00:00 1 via INTRAVENOUS
  Filled 2017-03-31: qty 1

## 2017-03-31 MED ORDER — TECHNETIUM TC 99M TETROFOSMIN INJECTION
1.0000 | Freq: Once | Status: AC | PRN
Start: 2017-03-31 — End: 2017-03-31
  Administered 2017-03-31: 12:00:00 1 via INTRAVENOUS
  Filled 2017-03-31: qty 1

## 2017-06-03 ENCOUNTER — Observation Stay
Admission: EM | Admit: 2017-06-03 | Discharge: 2017-06-05 | Disposition: A | Discharge status: Home Health | Payer: Medicare Other | Attending: Surgery | Admitting: Surgery

## 2017-06-03 ENCOUNTER — Emergency Department: Payer: Medicare Other

## 2017-06-03 ENCOUNTER — Observation Stay: Payer: Medicare Other

## 2017-06-03 DIAGNOSIS — E875 Hyperkalemia: Secondary | ICD-10-CM | POA: Insufficient documentation

## 2017-06-03 DIAGNOSIS — E039 Hypothyroidism, unspecified: Secondary | ICD-10-CM | POA: Insufficient documentation

## 2017-06-03 DIAGNOSIS — M810 Age-related osteoporosis without current pathological fracture: Secondary | ICD-10-CM | POA: Insufficient documentation

## 2017-06-03 DIAGNOSIS — S329XXA Fracture of unspecified parts of lumbosacral spine and pelvis, initial encounter for closed fracture: Secondary | ICD-10-CM | POA: Insufficient documentation

## 2017-06-03 DIAGNOSIS — S32029A Unspecified fracture of second lumbar vertebra, initial encounter for closed fracture: Principal | ICD-10-CM | POA: Insufficient documentation

## 2017-06-03 DIAGNOSIS — S32020A Wedge compression fracture of second lumbar vertebra, initial encounter for closed fracture: Secondary | ICD-10-CM

## 2017-06-03 DIAGNOSIS — S20211A Contusion of right front wall of thorax, initial encounter: Secondary | ICD-10-CM | POA: Insufficient documentation

## 2017-06-03 DIAGNOSIS — R51 Headache: Secondary | ICD-10-CM | POA: Insufficient documentation

## 2017-06-03 DIAGNOSIS — R112 Nausea with vomiting, unspecified: Secondary | ICD-10-CM | POA: Insufficient documentation

## 2017-06-03 DIAGNOSIS — I10 Essential (primary) hypertension: Secondary | ICD-10-CM | POA: Insufficient documentation

## 2017-06-03 DIAGNOSIS — H409 Unspecified glaucoma: Secondary | ICD-10-CM | POA: Insufficient documentation

## 2017-06-03 DIAGNOSIS — W010XXA Fall on same level from slipping, tripping and stumbling without subsequent striking against object, initial encounter: Secondary | ICD-10-CM | POA: Insufficient documentation

## 2017-06-03 DIAGNOSIS — Z880 Allergy status to penicillin: Secondary | ICD-10-CM | POA: Insufficient documentation

## 2017-06-03 DIAGNOSIS — I4891 Unspecified atrial fibrillation: Secondary | ICD-10-CM | POA: Insufficient documentation

## 2017-06-03 DIAGNOSIS — I341 Nonrheumatic mitral (valve) prolapse: Secondary | ICD-10-CM | POA: Insufficient documentation

## 2017-06-03 DIAGNOSIS — M545 Low back pain: Secondary | ICD-10-CM | POA: Insufficient documentation

## 2017-06-03 DIAGNOSIS — Y92009 Unspecified place in unspecified non-institutional (private) residence as the place of occurrence of the external cause: Secondary | ICD-10-CM | POA: Insufficient documentation

## 2017-06-03 DIAGNOSIS — Z86711 Personal history of pulmonary embolism: Secondary | ICD-10-CM | POA: Insufficient documentation

## 2017-06-03 DIAGNOSIS — K5190 Ulcerative colitis, unspecified, without complications: Secondary | ICD-10-CM | POA: Insufficient documentation

## 2017-06-03 DIAGNOSIS — Z7982 Long term (current) use of aspirin: Secondary | ICD-10-CM | POA: Insufficient documentation

## 2017-06-03 DIAGNOSIS — K219 Gastro-esophageal reflux disease without esophagitis: Secondary | ICD-10-CM | POA: Insufficient documentation

## 2017-06-03 DIAGNOSIS — R262 Difficulty in walking, not elsewhere classified: Secondary | ICD-10-CM | POA: Insufficient documentation

## 2017-06-03 DIAGNOSIS — Z86718 Personal history of other venous thrombosis and embolism: Secondary | ICD-10-CM | POA: Insufficient documentation

## 2017-06-03 DIAGNOSIS — D649 Anemia, unspecified: Secondary | ICD-10-CM | POA: Insufficient documentation

## 2017-06-03 HISTORY — DX: Age-related osteoporosis without current pathological fracture: M81.0

## 2017-06-03 HISTORY — DX: Wedge compression fracture of second lumbar vertebra, initial encounter for closed fracture: S32.020A

## 2017-06-03 LAB — CBC AND DIFFERENTIAL
Absolute NRBC: 0 10*3/uL
Basophils Absolute Automated: 0.07 10*3/uL (ref 0.00–0.20)
Basophils Automated: 0.8 %
Eosinophils Absolute Automated: 0.31 10*3/uL (ref 0.00–0.70)
Eosinophils Automated: 3.6 %
Hematocrit: 38.4 % (ref 37.0–47.0)
Hgb: 11.8 g/dL — ABNORMAL LOW (ref 12.0–16.0)
Immature Granulocytes Absolute: 0.03 10*3/uL
Immature Granulocytes: 0.3 %
Lymphocytes Absolute Automated: 1.85 10*3/uL (ref 0.50–4.40)
Lymphocytes Automated: 21.2 %
MCH: 30.3 pg (ref 28.0–32.0)
MCHC: 30.7 g/dL — ABNORMAL LOW (ref 32.0–36.0)
MCV: 98.7 fL (ref 80.0–100.0)
MPV: 10.5 fL (ref 9.4–12.3)
Monocytes Absolute Automated: 0.58 10*3/uL (ref 0.00–1.20)
Monocytes: 6.6 %
Neutrophils Absolute: 5.89 10*3/uL (ref 1.80–8.10)
Neutrophils: 67.5 %
Nucleated RBC: 0 /100 WBC (ref 0.0–1.0)
Platelets: 229 10*3/uL (ref 140–400)
RBC: 3.89 10*6/uL — ABNORMAL LOW (ref 4.20–5.40)
RDW: 13 % (ref 12–15)
WBC: 8.73 10*3/uL (ref 3.50–10.80)

## 2017-06-03 LAB — COMPREHENSIVE METABOLIC PANEL
ALT: 24 U/L (ref 0–55)
AST (SGOT): 21 U/L (ref 5–34)
Albumin/Globulin Ratio: 1.2 (ref 0.9–2.2)
Albumin: 3.2 g/dL — ABNORMAL LOW (ref 3.5–5.0)
Alkaline Phosphatase: 140 U/L — ABNORMAL HIGH (ref 37–106)
Anion Gap: 6 (ref 5.0–15.0)
BUN: 14.2 mg/dL (ref 7.0–19.0)
Bilirubin, Total: 0.4 mg/dL (ref 0.2–1.2)
CO2: 28 mEq/L (ref 22–29)
Calcium: 8.6 mg/dL (ref 8.5–10.5)
Chloride: 107 mEq/L (ref 100–111)
Creatinine: 0.7 mg/dL (ref 0.6–1.0)
Globulin: 2.7 g/dL (ref 2.0–3.6)
Glucose: 125 mg/dL — ABNORMAL HIGH (ref 70–100)
Potassium: 5.2 mEq/L — ABNORMAL HIGH (ref 3.5–5.1)
Protein, Total: 5.9 g/dL — ABNORMAL LOW (ref 6.0–8.3)
Sodium: 141 mEq/L (ref 136–145)

## 2017-06-03 LAB — URINALYSIS, REFLEX TO MICROSCOPIC EXAM IF INDICATED
Bilirubin, UA: NEGATIVE
Blood, UA: NEGATIVE
Glucose, UA: NEGATIVE
Ketones UA: 5 — AB
Leukocyte Esterase, UA: NEGATIVE
Nitrite, UA: NEGATIVE
Protein, UR: 30 — AB
Specific Gravity UA: 1.028 (ref 1.001–1.035)
Urine pH: 5 (ref 5.0–8.0)
Urobilinogen, UA: NORMAL mg/dL

## 2017-06-03 LAB — ECG 12-LEAD
Atrial Rate: 71 {beats}/min
P Axis: -1 degrees
P-R Interval: 128 ms
Q-T Interval: 388 ms
QRS Duration: 84 ms
QTC Calculation (Bezet): 421 ms
R Axis: 26 degrees
T Axis: 18 degrees
Ventricular Rate: 71 {beats}/min

## 2017-06-03 LAB — PT AND APTT
PT INR: 0.9 (ref 0.9–1.1)
PT: 12.4 s — ABNORMAL LOW (ref 12.6–15.0)
PTT: 26 s (ref 23–37)

## 2017-06-03 LAB — TROPONIN I: Troponin I: <0.01 ng/mL (ref 0.00–0.09)

## 2017-06-03 LAB — GFR: EGFR: >60

## 2017-06-03 LAB — LIPASE: Lipase: <4 U/L — ABNORMAL LOW (ref 8–78)

## 2017-06-03 MED ORDER — ONDANSETRON HCL 4 MG/2ML IJ SOLN
4.0000 mg | Freq: Once | INTRAMUSCULAR | Status: AC
Start: 2017-06-03 — End: 2017-06-03
  Administered 2017-06-03: 12:00:00 4 mg via INTRAVENOUS
  Filled 2017-06-03: qty 2

## 2017-06-03 MED ORDER — FENTANYL CITRATE (PF) 50 MCG/ML IJ SOLN (WRAP)
50.0000 ug | Freq: Once | INTRAMUSCULAR | Status: AC
Start: 2017-06-03 — End: 2017-06-03
  Administered 2017-06-03: 16:00:00 50 ug via INTRAVENOUS
  Filled 2017-06-03: qty 2

## 2017-06-03 MED ORDER — CYCLOBENZAPRINE HCL 10 MG PO TABS
5.0000 mg | ORAL_TABLET | Freq: Three times a day (TID) | ORAL | Status: DC | PRN
Start: 2017-06-03 — End: 2017-06-05

## 2017-06-03 MED ORDER — OXYCODONE HCL 5 MG PO TABS
5.0000 mg | ORAL_TABLET | ORAL | Status: DC | PRN
Start: 2017-06-03 — End: 2017-06-03

## 2017-06-03 MED ORDER — PROMETHAZINE HCL 25 MG RE SUPP
12.5000 mg | Freq: Four times a day (QID) | RECTAL | Status: DC | PRN
Start: 2017-06-03 — End: 2017-06-05

## 2017-06-03 MED ORDER — CARVEDILOL 3.125 MG PO TABS
3.1250 mg | ORAL_TABLET | Freq: Two times a day (BID) | ORAL | Status: DC
Start: 2017-06-03 — End: 2017-06-05
  Administered 2017-06-04 – 2017-06-05 (×3): 3.125 mg via ORAL
  Filled 2017-06-03 (×4): qty 1

## 2017-06-03 MED ORDER — SODIUM CHLORIDE 0.9 % IV SOLN
INTRAVENOUS | Status: DC
Start: 2017-06-03 — End: 2017-06-05

## 2017-06-03 MED ORDER — OXYCODONE HCL 5 MG PO TABS
5.0000 mg | ORAL_TABLET | ORAL | Status: DC | PRN
Start: 2017-06-03 — End: 2017-06-04

## 2017-06-03 MED ORDER — ONDANSETRON HCL 4 MG/2ML IJ SOLN
4.0000 mg | Freq: Three times a day (TID) | INTRAMUSCULAR | Status: DC | PRN
Start: 2017-06-03 — End: 2017-06-05
  Administered 2017-06-03 – 2017-06-04 (×3): 4 mg via INTRAVENOUS
  Filled 2017-06-03 (×3): qty 2

## 2017-06-03 MED ORDER — OXYCODONE HCL 5 MG PO TABS
10.0000 mg | ORAL_TABLET | ORAL | Status: DC | PRN
Start: 2017-06-03 — End: 2017-06-03

## 2017-06-03 MED ORDER — ACETAMINOPHEN 325 MG PO TABS
650.0000 mg | ORAL_TABLET | Freq: Four times a day (QID) | ORAL | Status: DC | PRN
Start: 2017-06-03 — End: 2017-06-03

## 2017-06-03 MED ORDER — ESCITALOPRAM OXALATE 20 MG PO TABS
20.0000 mg | ORAL_TABLET | Freq: Every day | ORAL | Status: DC
Start: 2017-06-04 — End: 2017-06-05
  Administered 2017-06-04 – 2017-06-05 (×2): 20 mg via ORAL
  Filled 2017-06-03 (×2): qty 1

## 2017-06-03 MED ORDER — MESALAMINE 400 MG PO CPDR
1200.0000 mg | DELAYED_RELEASE_CAPSULE | Freq: Every morning | ORAL | Status: DC
Start: 2017-06-04 — End: 2017-06-03

## 2017-06-03 MED ORDER — NALOXONE HCL 0.4 MG/ML IJ SOLN (WRAP)
0.2000 mg | INTRAMUSCULAR | Status: DC | PRN
Start: 2017-06-03 — End: 2017-06-05

## 2017-06-03 MED ORDER — PROMETHAZINE HCL 25 MG PO TABS
25.0000 mg | ORAL_TABLET | Freq: Four times a day (QID) | ORAL | Status: DC | PRN
Start: 2017-06-03 — End: 2017-06-05

## 2017-06-03 MED ORDER — ACETAMINOPHEN 325 MG PO TABS
650.0000 mg | ORAL_TABLET | ORAL | Status: DC | PRN
Start: 2017-06-03 — End: 2017-06-05
  Administered 2017-06-04 (×3): 650 mg via ORAL
  Filled 2017-06-03 (×3): qty 2

## 2017-06-03 MED ORDER — OXYCODONE HCL 5 MG PO TABS
10.0000 mg | ORAL_TABLET | ORAL | Status: DC | PRN
Start: 2017-06-03 — End: 2017-06-04
  Administered 2017-06-03 – 2017-06-04 (×2): 10 mg via ORAL
  Filled 2017-06-03 (×2): qty 2

## 2017-06-03 MED ORDER — HYDRALAZINE HCL 20 MG/ML IJ SOLN
10.0000 mg | Freq: Four times a day (QID) | INTRAMUSCULAR | Status: DC | PRN
Start: 2017-06-03 — End: 2017-06-05

## 2017-06-03 MED ORDER — IODIXANOL 320 MG/ML IV SOLN
100.0000 mL | Freq: Once | INTRAVENOUS | Status: AC | PRN
Start: 2017-06-03 — End: 2017-06-03
  Administered 2017-06-03: 14:00:00 100 mL via INTRAVENOUS

## 2017-06-03 MED ORDER — PROMETHAZINE HCL 25 MG/ML IJ SOLN
12.5000 mg | Freq: Four times a day (QID) | INTRAMUSCULAR | Status: DC | PRN
Start: 2017-06-03 — End: 2017-06-05
  Administered 2017-06-04: 13:00:00 12.5 mg via INTRAVENOUS
  Filled 2017-06-03: qty 1

## 2017-06-03 MED ORDER — LEVOTHYROXINE SODIUM 100 MCG PO TABS
100.0000 ug | ORAL_TABLET | Freq: Every day | ORAL | Status: DC
Start: 2017-06-04 — End: 2017-06-05
  Administered 2017-06-04 – 2017-06-05 (×2): 100 ug via ORAL
  Filled 2017-06-03 (×2): qty 1

## 2017-06-03 MED ORDER — AMITRIPTYLINE HCL 25 MG PO TABS
25.0000 mg | ORAL_TABLET | Freq: Every evening | ORAL | Status: DC
Start: 2017-06-03 — End: 2017-06-05
  Administered 2017-06-03 – 2017-06-04 (×2): 25 mg via ORAL
  Filled 2017-06-03 (×3): qty 1

## 2017-06-03 MED ORDER — ONDANSETRON HCL 4 MG PO TABS
4.0000 mg | ORAL_TABLET | Freq: Three times a day (TID) | ORAL | Status: DC | PRN
Start: 2017-06-03 — End: 2017-06-05

## 2017-06-03 MED ORDER — FENTANYL CITRATE (PF) 50 MCG/ML IJ SOLN (WRAP)
50.0000 ug | Freq: Once | INTRAMUSCULAR | Status: AC
Start: 2017-06-03 — End: 2017-06-03
  Administered 2017-06-03: 14:00:00 50 ug via INTRAVENOUS
  Filled 2017-06-03: qty 2

## 2017-06-03 MED ORDER — MORPHINE SULFATE 2 MG/ML IJ/IV SOLN (WRAP)
2.0000 mg | Status: DC | PRN
Start: 2017-06-03 — End: 2017-06-04
  Administered 2017-06-03: 2 mg via INTRAVENOUS
  Filled 2017-06-03: qty 1

## 2017-06-03 MED ORDER — FENTANYL CITRATE (PF) 50 MCG/ML IJ SOLN (WRAP)
50.0000 ug | Freq: Once | INTRAMUSCULAR | Status: AC
Start: 2017-06-03 — End: 2017-06-03
  Administered 2017-06-03: 12:00:00 50 ug via INTRAVENOUS
  Filled 2017-06-03: qty 2

## 2017-06-03 MED ORDER — ENOXAPARIN SODIUM 30 MG/0.3ML SC SOLN
30.0000 mg | Freq: Two times a day (BID) | SUBCUTANEOUS | Status: DC
Start: 2017-06-03 — End: 2017-06-05
  Administered 2017-06-03 – 2017-06-05 (×4): 30 mg via SUBCUTANEOUS
  Filled 2017-06-03 (×4): qty 0.3

## 2017-06-03 MED ORDER — MESALAMINE 400 MG PO CPDR
400.0000 mg | DELAYED_RELEASE_CAPSULE | Freq: Three times a day (TID) | ORAL | Status: DC
Start: 2017-06-03 — End: 2017-06-05
  Administered 2017-06-03 – 2017-06-05 (×4): 400 mg via ORAL
  Filled 2017-06-03 (×7): qty 1
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
## 2017-06-03 NOTE — ED Triage Notes (Signed)
Patient fell two days ago, injuring her right rib area, lower back and left leg.  Patient reports a ground level fall, did not lose consciousness.  Patient and family deny weapons.

## 2017-06-03 NOTE — Progress Notes (Addendum)
Orthosis Wearing Schedule      Your Physician has prescribed a TLSO brace for to you wear when you are upright or out of the bed.   DO NOT SLEEP in the TLSO.  Wear a form fitting T-shirt underneath the TLSO.  Check your skin each time you put on and take off the TLSO.    Adjustment Period  There is an adjustment period with nearly every type of orthosis.  The body is not used to wearing an orthosis, and needs to adjust to it gradually.  Typically, the orthotist will give you a wearing schedule to give your body adequate time to adjust to the orthosis.  It is important to adhere to the wearing schedule.  Over-wearing the brace at first can result in soreness and fatigue.    Wearing schedule - A typical wearing schedule is starting with 2 hours of wear per day, and gradually increasing by 2 hour increments each successive day.  If it begins to feel sore, you can remove it early for that day.   But do not over-wear the orthosis.    If your doctor has given specific wearing instructions, their instructions supersede these instructions.    If manufacturers wearing instructions were provided these are to be followed.  It is not recommended you operate a vehicle while wearing your orthosis as it can impede your driving.                                                                                               Need for Adjustment  Pay close attention to your skin when you remove the orthosis.  If you are wearing the orthosis correctly, and it is a good fit, you should have an overall pink or dark hue of the skin when the orthosis is removed.  However, the skin tone should quickly return to normal once removed.  If a red or dark spot stays for longer than 30 minutes after the orthosis is removed, you are in danger of developing a hot-spot or blister.  Also check for any bruising, callous or rash.  Please discontinue wear and schedule a follow up appointment with your orthotist.  The orthotist can make an adjustment  that will relieve this spot and restore proper fit and function to the brace.    Call our office if you need an adjustment, have questions or need to schedule follow up appointment.  Cleaning, Care and Maintenance  Plastic braces can be cleaned with soap and water and thoroughly washed.  Another option is to mix a half and half solution of alcohol and water and rub down the orthosis.  Wipe dry.    Braces with cloth should be hand washed with soap and water, and well rinsed.  Lay flat or hang to dry.      [address], [facility] [address]     [phone](P)/[phone](F)         [place], [place]  [address]

## 2017-06-03 NOTE — ED Notes (Signed)
Patient is one assist, has no known isolation requirements.

## 2017-06-03 NOTE — Progress Notes (Signed)
Name: Leslie Gross     MRN: [id number]    Ht: Height: 162.6 cm (5\' 4" )  Wt:Weight: 72.6 kg (160 lb) Adm Date: 06/03/2017     Affected side: spine    Reason for Visit: evaluation, measurement, fitting and delivery    Observations: Patient was laying supine in bed with family member present.     Today's Session: measurements, fit and delivered. Before fitting don and doffing instructions were communicated to the patient and family member present. Patient was fit via log roll with assistance from patients nurse. Patient tolerated the brace and brace was left on patient.    Brace Issued: TLSO DON JOY     Ordering Physician: Guadalupe Maple    Delivery Receipt Signed by:  Patient.       Patient/Caretaker is able to properly don and doff device - Yes and Educational handouts provided     Wear and care instructions provided to patient.         [address]  [place], [place] [address]  [phone]
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
## 2017-06-03 NOTE — Progress Notes (Signed)
Received pt from OBS unit via stretcher. Back brace with pt. C/o 8/10 back pain and headache. Says feels like head is going to explode, also c/o nausea. Gave IV morphine and IV zofran.
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
## 2017-06-04 ENCOUNTER — Observation Stay: Payer: Medicare Other

## 2017-06-04 DIAGNOSIS — S32029A Unspecified fracture of second lumbar vertebra, initial encounter for closed fracture: Secondary | ICD-10-CM

## 2017-06-04 DIAGNOSIS — Y92009 Unspecified place in unspecified non-institutional (private) residence as the place of occurrence of the external cause: Secondary | ICD-10-CM

## 2017-06-04 DIAGNOSIS — W010XXA Fall on same level from slipping, tripping and stumbling without subsequent striking against object, initial encounter: Secondary | ICD-10-CM

## 2017-06-04 LAB — CBC
Absolute NRBC: 0 10*3/uL
Hematocrit: 36 % — ABNORMAL LOW (ref 37.0–47.0)
Hgb: 11.3 g/dL — ABNORMAL LOW (ref 12.0–16.0)
MCH: 30.5 pg (ref 28.0–32.0)
MCHC: 31.4 g/dL — ABNORMAL LOW (ref 32.0–36.0)
MCV: 97 fL (ref 80.0–100.0)
MPV: 10.6 fL (ref 9.4–12.3)
Nucleated RBC: 0 /100 WBC (ref 0.0–1.0)
Platelets: 211 10*3/uL (ref 140–400)
RBC: 3.71 10*6/uL — ABNORMAL LOW (ref 4.20–5.40)
RDW: 13 % (ref 12–15)
WBC: 7.36 10*3/uL (ref 3.50–10.80)

## 2017-06-04 LAB — BASIC METABOLIC PANEL
Anion Gap: 6 (ref 5.0–15.0)
BUN: 8.9 mg/dL (ref 7.0–19.0)
CO2: 29 mEq/L (ref 22–29)
Calcium: 8.4 mg/dL — ABNORMAL LOW (ref 8.5–10.5)
Chloride: 104 mEq/L (ref 100–111)
Creatinine: 0.6 mg/dL (ref 0.6–1.0)
Glucose: 93 mg/dL (ref 70–100)
Potassium: 5.1 mEq/L (ref 3.5–5.1)
Sodium: 139 mEq/L (ref 136–145)

## 2017-06-04 LAB — GFR: EGFR: >60

## 2017-06-04 MED ORDER — FAMOTIDINE 20 MG PO TABS
20.0000 mg | ORAL_TABLET | Freq: Two times a day (BID) | ORAL | Status: DC
Start: 2017-06-04 — End: 2017-06-05
  Administered 2017-06-04 – 2017-06-05 (×2): 20 mg via ORAL
  Filled 2017-06-04 (×3): qty 1

## 2017-06-04 MED ORDER — MORPHINE SULFATE 2 MG/ML IJ/IV SOLN (WRAP)
2.0000 mg | Status: DC | PRN
Start: 2017-06-04 — End: 2017-06-05

## 2017-06-04 MED ORDER — TRAMADOL HCL 50 MG PO TABS
50.0000 mg | ORAL_TABLET | Freq: Four times a day (QID) | ORAL | Status: DC | PRN
Start: 2017-06-04 — End: 2017-06-05

## 2017-06-04 MED ORDER — FAMOTIDINE 10 MG/ML IV SOLN (WRAP)
20.0000 mg | Freq: Two times a day (BID) | INTRAVENOUS | Status: DC
Start: 2017-06-04 — End: 2017-06-05

## 2017-06-04 MED ORDER — KETOROLAC TROMETHAMINE 15 MG/ML IJ SOLN
15.0000 mg | Freq: Three times a day (TID) | INTRAMUSCULAR | Status: DC
Start: 2017-06-04 — End: 2017-06-05
  Administered 2017-06-04 – 2017-06-05 (×4): 15 mg via INTRAVENOUS
  Filled 2017-06-04 (×4): qty 1

## 2017-06-04 MED ORDER — BUTALBITAL-APAP-CAFFEINE 50-325-40 MG PO TABS
1.0000 | ORAL_TABLET | ORAL | Status: DC | PRN
Start: 2017-06-04 — End: 2017-06-05
  Administered 2017-06-04: 1 via ORAL
  Filled 2017-06-04: qty 1

## 2017-06-04 NOTE — Plan of Care (Signed)
Problem: Safety  Goal: Patient will be free from injury during hospitalization  Outcome: Progressing   06/04/17 0146   Goal/Interventions addressed this shift   Patient will be free from injury during hospitalization  Assess patient's risk for falls and implement fall prevention plan of care per policy;Ensure appropriate safety devices are available at the bedside;Provide and maintain safe environment       Problem: Pain  Goal: Pain at adequate level as identified by patient  Outcome: Progressing   06/04/17 0146   Goal/Interventions addressed this shift   Pain at adequate level as identified by patient Identify patient comfort function goal;Assess for risk of opioid induced respiratory depression, including snoring/sleep apnea. Alert healthcare team of risk factors identified.;Assess pain on admission, during daily assessment and/or before any "as needed" intervention(s)       Problem: Psychosocial and Spiritual Needs  Goal: Demonstrates ability to cope with hospitalization/illness  Outcome: Progressing   06/04/17 0146   Goal/Interventions addressed this shift   Demonstrates ability to cope with hospitalizations/illness Encourage verbalization of feelings/concerns/expectations;Provide quiet environment;Assist patient to identify own strengths and abilities

## 2017-06-04 NOTE — Progress Notes (Signed)
ACUTE CARE SURGERY/TRAUMA PROGRESS NOTE          Date Time: 06/04/17 1:32 PM  Patient Name: Leslie Gross  Attending Physician: Sharlyn Bologna, MD       ASSESSMENT:   Non-displaced superior endplate L2 fracture with minimal LOH and no retropulsion  R chest wall pain  Headache  N/v likely r/t pain medications  Tertiary survey completed without additional injuries identified.    H/o HTN, GERD, UC, hypothyroid, depression    PLAN:   - Repeat upright lumbar films complete demonstrating stable alignment of spinal column while in brace. Ok to proceed with PT/OT, OOB with TLSO brace unless reclined in chair.  - CT chest reviewed and no evidence of rib fracture.  Likely constusion +/- intercostal muscle tension in R chest wall.  Adjust pain meds as below.  Pt to begin PT/OT. Encourage IS.  - Oxycodone rx likely exacerbating n/v and not controlling pain well.  Will switch to tramadol, tylenol prn. Also has flexeril prn for back spasms.  Ketorolac added as well after CT head confirmed negative for bleed. Morphine IV prn breakthrough.  - Pt c/o headache since hitting the back of her head during fall.  Scalp atraumatic.  Head CT obtained today and shows no acute injury.  Chronic supratentorial white matter changes as specified.  Add ketorolac for back and headache pain.  May consider fiorocet if not well controlled.  - Appreciate management of Dr. Shiela Mayer team        SUBJECTIVE:   C/o headache, pain in R anterior chest wall and back pain.  Denies neck pain, abdominal pain.  No pain in LE.  Holding bag to mouth as we enter room.  Feels "very nauseous", has vomited x 2 this am after received pain meds.    MEDICATIONS:     Current Facility-Administered Medications   Medication Dose Route Frequency   . amitriptyline  25 mg Oral QHS   . carvedilol  3.125 mg Oral Q12H SCH   . enoxaparin  30 mg Subcutaneous Q12H   . escitalopram  20 mg Oral Daily   . ketorolac  15 mg Intravenous Q8H SCH   . levothyroxine  100 mcg Oral Daily  at 0600   . mesalamine  400 mg Oral TID MEALS     PHYSICAL EXAM:     Vitals:    06/04/17 0523 06/04/17 0849 06/04/17 1111 06/04/17 1311   BP: 120/70 101/68 131/62 136/75   Pulse: 75 79 70 71   Resp: 16 16  16    Temp: 98.6 F (37 C) 98.1 F (36.7 C)  99 F (37.2 C)   TempSrc:       SpO2: 94% 93%  96%   Weight:       Height:           General appearance - alert, feeling nauseated, no distress   Head - no brushing, deformities or lesions appreciated.   Eyes - pupils equal and reactive, extraocular eye movements intact  Ears - bilateral external ear canals normal  Nose - non-tender to palpation, nares patent, no erythema or discharge from nose   Mouth - mucous membranes moist, pharynx normal without lesions  Neck - supple, trachea midline  Chest - pain to palpation over R anterior lower chest region, clear to auscultation with good excursion, no pleuritic pain, no wheezes, rales or rhonchi, symmetric air entry  Heart - normal rate and regular rhythm, S1 and S2 normal  Abdomen - soft, nontender,  nondistended, bowel sounds present  Neurological - motor and sensory grossly normal bilaterally  Musculoskeletal - no joint tenderness, deformity or swelling  Extremities - peripheral pulses normal, no pedal edema, no cyanosis,no redness or tenderness in the calves or thighs  Back - non-tender to palpation over cervical spine.  + tenderness mid-lower spine with palpation. No obvious spasm.      Intake and Output Summary (Last 24 hours) at Date Time    Intake/Output Summary (Last 24 hours) at 06/04/17 1332  Last data filed at 06/04/17 1200   Gross per 24 hour   Intake             1122 ml   Output              900 ml   Net              222 ml     LABS:     Results     Procedure Component Value Units Date/Time    Basic Metabolic Panel [[id number]]  (Abnormal) Collected:  06/04/17 1132    Specimen:  Blood Updated:  06/04/17 1152     Glucose 93 mg/dL      BUN 8.9 mg/dL      Creatinine 0.6 mg/dL      Calcium 8.4 (L) mg/dL      Sodium  782 mEq/L      Potassium 5.1 mEq/L      Chloride 104 mEq/L      CO2 29 mEq/L      Anion Gap 6.0    GFR [[id number]] Collected:  06/04/17 1132     Updated:  06/04/17 1152     EGFR >60.0    CBC without differential [[id number]]  (Abnormal) Collected:  06/04/17 1132    Specimen:  Blood from Blood Updated:  06/04/17 1140     WBC 7.36 x10 3/uL      Hgb 11.3 (L) g/dL      Hematocrit 52.8 (L) %      Platelets 211 x10 3/uL      RBC 3.71 (L) x10 6/uL      MCV 97.0 fL      MCH 30.5 pg      MCHC 31.4 (L) g/dL      RDW 13 %      MPV 10.6 fL      Nucleated RBC 0.0 /100 WBC      Absolute NRBC 0.00 x10 3/uL     Narrative:       pt in ultrasound per manpreet  06/04/2017  10:39        RADS:   Radiological procedure personally reviewed:     Ct T-spine Reconstruction    Result Date: 06/03/2017  1. Nondisplaced superior endplate fracture of L2 with approximately 22% loss of vertebral body height anteriorly and towards the left.   There is no osseous retropulsion or is there fracture extension into the posterior elements. 2.   No thoracic spine fracture is detected. Neldon Mc, MD 06/03/2017 1:51 PM    Xr Lumbar Spine Ap And Lateral    Result Date: 06/04/2017   Unremarkable appearance of L2 compression fracture in Geanie Cooley, MD 06/04/2017 10:39 AM    Xr Knee 1 Or 2 Views Left    Result Date: 06/04/2017   No acute bony process. Bea Laura, MD 06/03/2017 8:47 PM    Xr Knee 1 Or 2 Views Right    Result Date: 06/03/2017   No  acute bony process. 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 Terrilee Croak, MD 06/04/2017 10:51 AM    Ct Angiogram Chest    Result Date: 06/03/2017   1. No acute injury in the chest, abdomen or pelvis. 2. Negative for pulmonary emboli. 3. Stable mildly prominent mesenteric and retroperitoneal lymph nodes. Shelly Flatten, MD 06/03/2017 1:51 PM    Ct Abd/pelvis With Iv Contrast  Only    Result Date: 06/03/2017   1. No acute injury in the chest, abdomen or pelvis. 2. Negative for pulmonary emboli. 3. Stable mildly prominent mesenteric and retroperitoneal lymph nodes. Shelly Flatten, MD 06/03/2017 1:51 PM    Ct L-spine Reconstruction    Result Date: 06/03/2017  1. Nondisplaced superior endplate fracture of L2 with approximately 22% loss of vertebral body height anteriorly and towards the left.   There is no osseous retropulsion or is there fracture extension into the posterior elements. 2.   No thoracic spine fracture is detected. Neldon Mc, MD 06/03/2017 1:51 PM    EKG Results     Procedure Component Value Units Date/Time    ECG 12 Lead [[id number]] Collected:  06/03/17 1205     Updated:  06/03/17 1615     Ventricular Rate 71 BPM      Atrial Rate 71 BPM      P-R Interval 128 ms      QRS Duration 84 ms      Q-T Interval 388 ms      QTC Calculation (Bezet) 421 ms      P Axis -1 degrees      R Axis 26 degrees      T Axis 18 degrees     Narrative:       NORMAL SINUS RHYTHM    NO PREVIOUS ECGS AVAILABLE  Confirmed by LUY MD, JEFFREY ([id number]) on 06/03/2017 4:14:58 PM    EKG SCAN [[id number]] Resulted:  06/03/17 1615     Updated:  06/03/17 1615          I have personally reviewed the patient's history and 24 hour interval events, along with vitals, labs, radiology images and nursing. So far today I have spent 35 minutes providing care for this patient excluding teaching and billable procedures, and not overlapping with any other providers.    SIGNED BY:     Thana Ates PA-C  [facility]   Ext [phone]    I have directly reviewed the clinical findings, labs, imaging studies and management of this patient in detail. I have interviewed and examined the patient and agree with the documentation, assessment and plan Leslie Gross as recorded by Myles Gip, PA-C.      Dossie Arbour, MD    *This note was generated by the Epic EMR system/ Dragon speech recognition and may  contain inherent errors or omissions not intended by the user. Grammatical errors, random word insertions, deletions, pronoun errors and incomplete sentences are occasional consequences of this technology due to software limitations. Not all errors are caught or corrected. If there are questions or concerns about the content of this note or information contained within the body of this dictation they should be addressed directly with the author for clarification

## 2017-06-04 NOTE — Discharge Instr - AVS First Page (Addendum)
Wear brace at all times when out of bed or sitting upright in a chair.  Ok to apply brace on edge of bed.  Ok to remove brace while sleeping and showering. When showering avoid twisting or turning of spine.  You will follow up with neurosurgery in 4-6 weeks with repeat xrays.   Anticipate wearing your brace for 3 months total.     Please obtain a xray prior to your appointment with neurosurgery  You can obtain them the morning of your appointment or in advance a day or two, in the imaging department at [facility]. If you go to another imaging facility, please bring the disc with your xrays.     Home Health Discharge Information     Your doctor has ordered Physical Therapy and Occupational Therapy in-home service(s) for you while you recuperate at home, to assist you in the transition from hospital to home.      The agency that you or your representative chose to provide the service:  Name of Home Health Agency: [facility] ([phone])]      The above services were set up by:  Julien Girt  [facility] Liaison)   Phone      [phone]                                       Additional comments:   If you have not heard from your home health agency within 24-48 hours after discharge please call your agency to arrange a time for your first visit.  For any scheduling concerns or questions related to home health, such as time or date please contact your home health agency at the number listed above.     Signed by: Julien Girt RN, BSN  Date Time: 06/04/17 4:10 PM
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
## 2017-06-04 NOTE — Progress Notes (Signed)
Pt is a 66 y.o female admitted s/p fall on 8/18 sustaining a nondisplaced superior endplate L2 fx. Pt has a TLSO brace. Met w/ pt and Husband at bedside. Pt reported that her Husband can assist at home. Son, Nida Boatman lives in the area and can help if needed per pt. Discussed about skilled [facility] services and pt was agreeable. Referral made to [facility] Liaison for home P.T/O.T services. Discussed about d/c tomorrow. Husband will transport pt home when released.     D/C Disposition: Home w/ Husband and H.H services.        06/04/17 1821   Patient Type   Within 30 Days of Previous Admission? No   Healthcare Decisions   Interviewed: Patient;Spouse   Orientation/Decision Estate manager/land agent of Patient Alert and Oriented x3, able to make decisions   Advance Directive Patient has advance directive, copy not in chart   Advance Directive not in Chart Copy requested from family/decision maker   Healthcare Agent Appointed Yes   Healthcare Agent's Name Husband, Mayar Whittier   Healthcare Agent's Phone Number [phone]   Prior to admission   Prior level of function Independent with ADLs;Ambulates independently;Other (Comment)  (Drives)   Type of Residence Private residence   Home Layout Stairs to enter with rails (add number in comment);Multi-level;Able to live on main level with bedroom/bathroom  (4 STE)   Living Arrangements Spouse/significant other   How do you get to your MD appointments? Drives self   How do you get your groceries? self   Who fixes your meals? self   Who picks up your prescriptions? self.    Dressing Independent   Grooming Independent   Feeding Independent   Bathing Independent   Toileting Independent   DME Currently at Home Single point cane;Front wheel walker   Home Care/Community Services None   Prior SNF admission? (Detail) No   Discharge Planning   Support Systems Spouse/significant other;Children   Patient expects to be discharged to: Home w/ Husband and H.H services.    Anticipated [place] plan discussed with: Same as  interviewed   Mode of transportation: Private car (family member)   Consults/Providers   PT Evaluation Needed 1  (Yes)   OT Evalulation Needed 1  (Yes)   Correct PCP listed in Epic? Yes   Important Message from [facility] Notice   Patient received 1st IMM Letter? n/a
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
## 2017-06-04 NOTE — Progress Notes (Signed)
Home Health Referral          Referral from Sujatha Polina (Case Manager) for home health care upon discharge.    By [employer], the patient has the right to freely choose a home care provider.  Arrangements have been made with:     A company of the patients choosing. We have supplied the patient with a listing of providers in your area who asked to be included and participate in Medicare.   [facility], a home care agency that provides both adult home care services which is a wholly owned and operated by [employer] and participates in [employer]   The preferred provider of your insurance company. Choosing a home care provider other than your insurance company's preferred provider may affect your insurance coverage.    The Home Health Care Referral Form acknowledging the voluntary selection of the home care company has been completed, signed, and is on file.      Home Health Discharge Information     Your doctor has ordered Physical Therapy and Occupational Therapy in-home service(s) for you while you recuperate at home, to assist you in the transition from hospital to home.      The agency that you or your representative chose to provide the service:  Name of Home Health Agency: [facility] ([phone])]      The above services were set up by:  Julien Girt  [facility] Liaison)   Phone      [phone]                                       Additional comments:   If you have not heard from your home health agency within 24-48 hours after discharge please call your agency to arrange a time for your first visit.  For any scheduling concerns or questions related to home health, such as time or date please contact your home health agency at the number listed above.     Signed by: Julien Girt RN, BSN  Date Time: 06/04/17 4:10 PM

## 2017-06-04 NOTE — Plan of Care (Signed)
Problem: Pain  Goal: Pain at adequate level as identified by patient  Outcome: Progressing   06/04/17 2129   Goal/Interventions addressed this shift   Pain at adequate level as identified by patient Identify patient comfort function goal;Assess pain on admission, during daily assessment and/or before any "as needed" intervention(s);Evaluate if patient comfort function goal is met       Problem: Discharge Barriers  Goal: Patient will be discharged home or other facility with appropriate resources  Outcome: Progressing   06/04/17 2129   Goal/Interventions addressed this shift   Discharge to home or other facility with appropriate resources Provide appropriate patient education

## 2017-06-04 NOTE — Progress Notes (Signed)
PROGRESS NOTE        Date Time: 06/04/2017  12:38 PM  Patient Name:Leslie Gross  JYN:[id number]  PCP: Lana Fish, MD  Attending Physician: Christel Mormon, MD      Chief Complaint:      Chief Complaint   Patient presents with   . Fall   . Back Pain       Subjective:   Reports issue with pain, low back pain and right sided chest wall / rib pain. Headache. Nausea and Vomiting. Denies fever, chills, SOB, cough, CP, abdominal pain, diarrhea, constipation, dysuria, hematuria, headache, dizziness and syncope.    Assessment/Plan     Active Diagnosis: Active Problems:    Compression fracture of L2 lumbar vertebra, closed, initial encounter    Closed compression fracture of lumbosacral spine, initial encounter      HTN: Continue home medications. IV Hydralazine ordered as needed. Monitor vital signs. Follows with PCP outpatient.    GERD: Patient does not use a PPI, she takes Zantac at home. We will place on Pepcid BID here. Patient to elevate head of bed, avoid meals prior to bedtime, avoid caffeine, and avoid spicy food. Follows with PCP outpatient.    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 Follows with GI outpatient.    Hypothyroidism: Continue Synthroid daily. Follows with PCP outpatient.    Depression: Patient reports this to be stable. Continue home medications. Follows with PCP outpatient.    Low Back Pain: Secondary to mechanical fall and fracture. We will place patient on short acting oral pain medications. IV pain medications as needed for pain exacerbations. Side effects of narcotics including sedation, constipation, habituation were discussed in detail.Patient started on stool softener to avoid constipation.Discussed with nursing to monitor patient for oversedation.    Lumbar Fracture: CT Lumbar Spine 06/03/17 showed a nondisplaced superior endplate fracture of L2. Trauma Surgery following, Dr. Shannan Harper, appreciate recommendations.  Neurosurgery consulted, Dr. Sherril Croon, appreciate recommendations. No plans for acute surgical intervention. TLSO brace in place. Repeat upright Lumbar Spine Xray demonstrating stable alignment of spinal column while in brace. Ok to proceed with PT/OT, OOB with TLSO brace unless reclined in chair, d/w Trauma Surgery PA-C today. PT/OT ordered. Pain management as above. Will continue to monitor.    Nausea and Vomiting: Likely secondary to pain medications. Started on IV antiemetics as needed. Will monitor.    Headache: Patient reports hitting the back of her head during fall. 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 No acute abnormality. Pain management as above. Will add in Fioricet as needed. Will monitor.    D/w Trauma Surgery PA-C today.    DVT Prohylaxis: Lovenox  Code Status: Full Code   Disposition: Home when stable  Prognosis: Fair  Type of Admission:Observation  Estimated Length of Stay (including stay in the ER receiving treatment): 2 midnights  Medical Necessity for stay: Monitoring for Lumbar Fx and LBP    Allergies:     Allergies   Allergen Reactions   . Shellfish-Derived Products Angioedema   . Diflucan [Fluconazole] Diarrhea   . Penicillins Nausea And Vomiting   . Tape Other (See Comments)     Causes blisters       Physical Exam:    height is 1.626 m (5\' 4" ) and weight is 74.4 kg (164 lb). Her temperature is 99 F (37.2 C). Her blood pressure is 136/75 and her pulse is 71. Her respiration is 16 and oxygen saturation is 96%.   Body  mass index is 28.15 kg/m.  Vitals:    06/04/17 0523 06/04/17 0849 06/04/17 1111 06/04/17 1311   BP: 120/70 101/68 131/62 136/75   Pulse: 75 79 70 71   Resp: 16 16  16    Temp: 98.6 F (37 C) 98.1 F (36.7 C)  99 F (37.2 C)   TempSrc:       SpO2: 94% 93%  96%   Weight:       Height:         Intake and Output Summary (Last 24 hours) at Date Time    Intake/Output Summary (Last 24 hours) at 06/04/17 1439  Last data filed at 06/04/17  1311   Gross per 24 hour   Intake             1342 ml   Output              900 ml   Net              442 ml       Constitutional: Patient appears well-developed and well-nourished. In NAD.  Head: Normocephalic and atraumatic.  Eyes: Pupils equal and reactive, extraocular eye movements intact, sclera anicteric.  Ears: External ear canals normal, right ear normal, left ear normal.  Nose: Normal and patent, no erythema, normal nontender sinuses.  Mouth: Mucous membranes moist, pharynx normal without lesions.  Neck: Normal range of motion. Neck supple. No JVD present. No tracheal deviation present. No thyromegaly present.   Cardiovascular: Normal rate, regular rhythm, normal heart sounds and intact distal pulses. Exam reveals no gallop and no friction rub. No murmur heard.  Pulmonary/Chest: Effort normal and breath sounds normal. No stridor. No respiratory distress. No wheezes. No rhonchi. No rales were present. Exhibits no tenderness.   Abdominal: Soft. Bowel sounds are normal. Patient exhibits no distension and no mass was palpable. There is no tenderness. There is no rebound and no guarding.   Musculoskeletal: TLSO Brace in place. Lumbar Spine TTP. Normal range of motion of B/L UEs and LEs. Patient exhibits no edema.   Lymphadenopathy:  Patient has no cervical adenopathy.   Neurological: Patient is alert and oriented to person, place, and time and has normal reflexes. No cranial nerve deficit.  Normal muscle tone. Coordination normal.   Skin: Skin is warm. No rash noted. Patient is not diaphoretic. No erythema. No pallor.   Psychiatric: Has normal mood and affect. Behavior is normal. Judgment and thought content normal.    Consult Input/Plan     Plan   None    Review of Systems:   A comprehensive review of systems has no changes since H&P was obtained except as mentioned in the subjective section.    Vitals 24 hrs:   Vitals:    06/04/17 0523 06/04/17 0849 06/04/17 1111 06/04/17 1311   BP: 120/70 101/68 131/62 136/75    Pulse: 75 79 70 71   Resp: 16 16  16    Temp: 98.6 F (37 C) 98.1 F (36.7 C)  99 F (37.2 C)   TempSrc:       SpO2: 94% 93%  96%   Weight:       Height:            Readmission:   Hospital Outpatient Visit on 12/08/2014   Component Date Value Ref Range Status   . WBC 12/08/2014 11.26* 3.50 - 10.80 x10 3/uL Final   . Hgb 12/08/2014 11.4* 12.0 - 16.0 g/dL Final   . Hematocrit  12/08/2014 36.6* 37.0 - 47.0 % Final   . Platelets 12/08/2014 301  140 - 400 x10 3/uL Final   . RBC 12/08/2014 3.73* 4.20 - 5.40 x10 6/uL Final   . MCV 12/08/2014 98.1  80.0 - 100.0 fL Final   . MCH 12/08/2014 30.6  28.0 - 32.0 pg Final   . MCHC 12/08/2014 31.1* 32.0 - 36.0 g/dL Final   . RDW 16/07/9603 13  12 - 15 % Final   . MPV 12/08/2014 10.8  9.4 - 12.3 fL Final   . Neutrophils 12/08/2014 68  None % Final   . Lymphocytes Automated 12/08/2014 24  None % Final   . Monocytes 12/08/2014 5  None % Final   . Eosinophils Automated 12/08/2014 2  None % Final   . Basophils Automated 12/08/2014 0  None % Final   . Immature Granulocyte 12/08/2014 0  None % Final   . Neutrophils Absolute 12/08/2014 7.68  1.80 - 8.10 x10 3/uL Final   . Abs Lymph Automated 12/08/2014 2.69  0.50 - 4.40 x10 3/uL Final   . Abs Mono Automated 12/08/2014 0.59  0.00 - 1.20 x10 3/uL Final   . Abs Eos Automated 12/08/2014 0.25  0.00 - 0.70 x10 3/uL Final   . Absolute Baso Automated 12/08/2014 0.05  0.00 - 0.20 x10 3/uL Final   . Absolute Immature Granulocyte 12/08/2014 0.04  0 x10 3/uL Final   . Glucose 12/08/2014 93  70 - 100 mg/dL Final    Comment: Interpretive Data for Adult Female and Female Population  Indeterminate Range:  100-125 mg/dL  Equal to or greater than 126 mg/dL meets the ADA  guidelines for Diabetes Mellitus diagnosis if symptoms  are present and confirmed by repeat testing.  Random (Non-Fasting)Interpretive Data (Adults):  Equal to or greater than 200 mg/dL meets the ADA  guidelines for Diabetes Mellitus diagnosis if symptoms  are present and confirmed by  Fasting Glucose or GTT.     . BUN 12/08/2014 13.5  7.0 - 19.0 mg/dL Final   . Creatinine 54/06/8118 0.9  0.6 - 1.0 mg/dL Final   . Sodium 14/78/2956 140  136 - 145 mEq/L Final   . Potassium 12/08/2014 4.8  3.5 - 5.1 mEq/L Final   . Chloride 12/08/2014 108  100 - 111 mEq/L Final   . CO2 12/08/2014 23  22 - 29 mEq/L Final   . Calcium 12/08/2014 9.0  8.5 - 10.5 mg/dL Final   . Protein, Total 12/08/2014 6.2  6.0 - 8.3 g/dL Final   . Albumin 21/30/8657 3.5  3.5 - 5.0 g/dL Final   . AST (SGOT) 84/69/6295 22  5 - 34 U/L Final   . ALT 12/08/2014 20  0 - 55 U/L Final   . Alkaline Phosphatase 12/08/2014 131* 37 - 106 U/L Final   . Bilirubin, Total 12/08/2014 0.4  0.2 - 1.2 mg/dL Final   . Globulin 28/41/3244 2.7  2.0 - 3.6 g/dL Final   . Albumin/Globulin Ratio 12/08/2014 1.3  0.9 - 2.2 Final   . Anion Gap 12/08/2014 9.0  5.0 - 15.0 Final   . Ferritin 12/08/2014 307.65* 4.60 - 204.00 ng/mL Final   . Iron 12/08/2014 60  40 - 145 ug/dL Final   . UIBC 10/16/7251 206  126 - 382 ug/dL Final   . TIBC 66/44/0347 266  265 - 497 ug/dL Final   . Iron Saturation 12/08/2014 23  15 - 50 % Final   . Folate 12/08/2014 39.3  See below  ng/mL Final    Comment: Deficient    : <3.5 ng/mL  Intermediate : 3.5 - 5.4 ng/mL  Normal       : Greater Than 5.4 ng/mL     . Vitamin B-12 12/08/2014 >2000* 211 - 911 pg/mL Final   . Reticulocyte Count Automated 12/08/2014 3.0* 0.5 - 2.5 % Final   . Retic Ct Abs 12/08/2014 0.1120  0.0210 - 0.1350 x10 6/uL Final   . Immature Retic Fract 12/08/2014 15.0  3.0 - 15.9 % Final   . Immature Plt Fraction 12/08/2014 2.6  0.9 - 11.2 % Final   . Retic Hgb 12/08/2014 36.2  28.2 - 36.6 pg Final   . Hemolysis Index 12/08/2014 3  0 - 18 Final   . EGFR 12/08/2014 >60.0   Final    Comment: Disease State Reference Ranges:    Chronic Kidney Disease; < 60 ml/min/1.73 sq.m    Kidney Failure; < 15 ml/min/1.73 sq.m    [Calculated using IDMS-Traceable MDRD equation (based on    gender, age and black vs. non-black race) recommended  by    [employer] Kidney Disease Education Program. No data    available for non-white, non-black race.]  GFR estimates are unreliable in patients with:    Rapidly changing kidney function or recent dialysis,    extreme age, body size or body composition(obesity,    severe malnutrition). Abnormal muscle mass (limb    amputation, muscle wasting). In these patients,    alternative determinations of GFR should be obtained.          Coagulation Profile:   Recent Labs  Lab 06/03/17  1201   PT 12.4*   PT INR 0.9   PTT 26          Medications:   Current Facility-Administered Medications   Medication Dose Route Frequency Last Rate Last Dose   . 0.9%  NaCl infusion   Intravenous Continuous 100 mL/hr at 06/04/17 0545     . acetaminophen (TYLENOL) tablet 650 mg  650 mg Oral Q4H PRN   650 mg at 06/04/17 1059   . amitriptyline (ELAVIL) tablet 25 mg  25 mg Oral QHS   25 mg at 06/03/17 2145   . carvedilol (COREG) tablet 3.125 mg  3.125 mg Oral Q12H SCH   3.125 mg at 06/04/17 1111   . cyclobenzaprine (FLEXERIL) tablet 5 mg  5 mg Oral TID PRN       . enoxaparin (LOVENOX) syringe 30 mg  30 mg Subcutaneous Q12H   30 mg at 06/04/17 1100   . escitalopram (LEXAPRO) tablet 20 mg  20 mg Oral Daily   20 mg at 06/04/17 1059   . hydrALAZINE (APRESOLINE) injection 10 mg  10 mg Intravenous Q6H PRN       . ketorolac (TORADOL) injection 15 mg  15 mg Intravenous Q8H SCH   15 mg at 06/04/17 1400   . levothyroxine (SYNTHROID, LEVOTHROID) tablet 100 mcg  100 mcg Oral Daily at 0600   100 mcg at 06/04/17 1100   . mesalamine (DELZICOL) capsule 400 mg  400 mg Oral TID MEALS   400 mg at 06/04/17 1233   . morphine injection 2 mg  2 mg Intravenous Q2H PRN       . naloxone (NARCAN) injection 0.2 mg  0.2 mg Intravenous PRN       . ondansetron (ZOFRAN) tablet 4 mg  4 mg Oral Q8H PRN        Or   . ondansetron (  ZOFRAN) injection 4 mg  4 mg Intravenous Q8H PRN   4 mg at 06/04/17 0758   . promethazine (PHENERGAN) tablet 25 mg  25 mg Oral Q6H PRN        Or   .  promethazine (PHENERGAN) suppository 12.5 mg  12.5 mg Rectal Q6H PRN        Or   . promethazine (PHENERGAN) injection 12.5 mg  12.5 mg Intravenous Q6H PRN   12.5 mg at 06/04/17 1249   . traMADol (ULTRAM) tablet 50 mg  50 mg Oral Q6H PRN            CBC review:   Recent Labs  Lab 06/04/17  1132 06/03/17  1201   WBC 7.36 8.73   Hgb 11.3* 11.8*   Hematocrit 36.0* 38.4   Platelets 211 229   MCV 97.0 98.7   RDW 13 13   Neutrophils  --  67.5   Lymphocytes Automated  --  21.2   Eosinophils Automated  --  3.6   Immature Granulocyte  --  0.3   Neutrophils Absolute  --  5.89   Absolute Immature Granulocyte  --  0.03        Chem Review:  Recent Labs  Lab 06/04/17  1132 06/03/17  1201   Sodium 139 141   Potassium 5.1 5.2*   Chloride 104 107   CO2 29 28   BUN 8.9 14.2   Creatinine 0.6 0.7   Glucose 93 125*   Calcium 8.4* 8.6   Bilirubin, Total  --  0.4   AST (SGOT)  --  21   ALT  --  24   Alkaline Phosphatase  --  140*        Labs:     Results     Procedure Component Value Units Date/Time    Basic Metabolic Panel [[id number]]  (Abnormal) Collected:  06/04/17 1132    Specimen:  Blood Updated:  06/04/17 1152     Glucose 93 mg/dL      BUN 8.9 mg/dL      Creatinine 0.6 mg/dL      Calcium 8.4 (L) mg/dL      Sodium 409 mEq/L      Potassium 5.1 mEq/L      Chloride 104 mEq/L      CO2 29 mEq/L      Anion Gap 6.0    GFR [[id number]] Collected:  06/04/17 1132     Updated:  06/04/17 1152     EGFR >60.0    CBC without differential [[id number]]  (Abnormal) Collected:  06/04/17 1132    Specimen:  Blood from Blood Updated:  06/04/17 1140     WBC 7.36 x10 3/uL      Hgb 11.3 (L) g/dL      Hematocrit 57.8 (L) %      Platelets 211 x10 3/uL      RBC 3.71 (L) x10 6/uL      MCV 97.0 fL      MCH 30.5 pg      MCHC 31.4 (L) g/dL      RDW 13 %      MPV 10.6 fL      Nucleated RBC 0.0 /100 WBC      Absolute NRBC 0.00 x10 3/uL     Narrative:       pt in ultrasound per manpreet  06/04/2017  10:39        Rads:   Radiological Procedure reviewed.  Radiology Results  (24  Hour)     Procedure Component Value Units Date/Time    XR Knee 1 Or 2 Views Left [[id number]] Collected:  06/03/17 2046    Order Status:  Completed Updated:  06/04/17 1300    Narrative:       HISTORY: Trauma, pain    FINDINGS: AP and lateral views of the bilateral knees obtained. Left  knee arthroplasty has been performed. The prosthetic components are  intact and aligned. No detectable fracture, dislocation, or focal  lesion. Mild narrowing of the right medial lateral compartments  demonstrated. No joint effusion.      Impression:        No acute bony process.    Bea Laura, MD   06/03/2017 8:47 PM    CT Head WO Contrast [[id number]] Collected:  06/04/17 1051    Order Status:  Completed Updated:  06/04/17 1056    Narrative:       This CT study was performed using radiation dose reduction techniques  including one or more of the following: automated exposure control,  adjustment of the mA and/or kV according to patient's size and the use  of an iterative reconstruction technique.    History: Fall, head injury, headache.    FINDINGS: Unenhanced brain CT. No comparison studies.    There is mild diffuse parenchymal volume loss. There is mild hypodensity  in the mostly deep supratentorial white matter consistent with mild  chronic small vessel ischemic change. There is no mass or an acute  intracranial hemorrhage. The ventricular system and cisterns are  normally configured. There is minor mucosal thickening in the ethmoid  air cells. The cranial bones are intact.      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     Terrilee Croak, MD   06/04/2017 10:51 AM    XR Lumbar Spine AP And Lateral [[id number]] Collected:  06/04/17 1038    Order Status:  Completed Updated:  06/04/17 1044    Narrative:       History: L2 compression fracture. Upright imaging in brace.    COMPARISON: CT 06/03/2017    AP and lateral views of the lumbar spine obtained upright in  a TLSO  brace reveal anatomic alignment at L2, minimal superior endplate  depression is noted. Bony structures are otherwise unremarkable.      Impression:        Unremarkable appearance of L2 compression fracture in    Geanie Cooley, MD   06/04/2017 10:39 AM    XR Knee 1 Or 2 Views Right [[id number]] Collected:  06/03/17 2046    Order Status:  Completed Updated:  06/03/17 2051    Narrative:       HISTORY: Trauma, pain    FINDINGS: AP and lateral views of the bilateral knees obtained. Left  knee arthroplasty has been performed. The prosthetic components are  intact and aligned. No detectable fracture, dislocation, or focal  lesion. Mild narrowing of the right medial lateral compartments  demonstrated. No joint effusion.      Impression:        No acute bony process.    Bea Laura, MD   06/03/2017 8:47 PM          Time spent for evaluation, management and coordination of care:   35 minutes      Signed by:     Kathlee Nations Kevis Qu,MD    06/04/2017 12:38 PM

## 2017-06-04 NOTE — Plan of Care (Signed)
Problem: Safety  Goal: Patient will be free from injury during hospitalization  Outcome: Progressing   06/04/17 1305   Goal/Interventions addressed this shift   Patient will be free from injury during hospitalization  Assess patient's risk for falls and implement fall prevention plan of care per policy;Hourly rounding;Provide and maintain safe environment;Assess for patients risk for elopement and implement Elopement Risk Plan per policy;Ensure appropriate safety devices are available at the bedside;Use appropriate transfer methods       Problem: Pain  Goal: Pain at adequate level as identified by patient  Outcome: Progressing   06/04/17 1305   Goal/Interventions addressed this shift   Pain at adequate level as identified by patient Identify patient comfort function goal;Assess pain on admission, during daily assessment and/or before any "as needed" intervention(s);Reassess pain within 30-60 minutes of any procedure/intervention, per Pain Assessment, Intervention, Reassessment (AIR) Cycle;Evaluate if patient comfort function goal is met;Evaluate patient's satisfaction with pain management progress
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
## 2017-06-05 LAB — HEMOGLOBIN AND HEMATOCRIT, BLOOD
Hematocrit: 36.3 % — ABNORMAL LOW (ref 37.0–47.0)
Hgb: 11 g/dL — ABNORMAL LOW (ref 12.0–16.0)

## 2017-06-05 LAB — BASIC METABOLIC PANEL
Anion Gap: 7 (ref 5.0–15.0)
BUN: 9.3 mg/dL (ref 7.0–19.0)
CO2: 28 mEq/L (ref 22–29)
Calcium: 8.1 mg/dL — ABNORMAL LOW (ref 8.5–10.5)
Chloride: 107 mEq/L (ref 100–111)
Creatinine: 0.6 mg/dL (ref 0.6–1.0)
Glucose: 91 mg/dL (ref 70–100)
Potassium: 4.1 mEq/L (ref 3.5–5.1)
Sodium: 142 mEq/L (ref 136–145)

## 2017-06-05 LAB — GFR: EGFR: >60

## 2017-06-05 MED ORDER — CYCLOBENZAPRINE HCL 5 MG PO TABS
5.0000 mg | ORAL_TABLET | Freq: Three times a day (TID) | ORAL | 0 refills | Status: DC | PRN
Start: 2017-06-05 — End: 2023-08-21

## 2017-06-05 MED ORDER — ACETAMINOPHEN-CODEINE 300-30 MG PO TABS
1.0000 | ORAL_TABLET | ORAL | 0 refills | Status: AC | PRN
Start: 2017-06-05 — End: 2017-06-10

## 2017-06-05 NOTE — Discharge Summary (Signed)
Discharge Summary    Date:06/05/2017   Patient Name: Leslie Gross  Attending Physician: Lowella Dandy, MD    Date of Admission:   06/03/2017    Date of Discharge:   06/05/2017    Admitting Diagnosis:   Fall with lumbar compression fracture    Discharge Dx:     Principal Diagnosis (Diagnosis after study, that is chiefly responsible for admission to inpatient status):   Active Hospital Problems    Diagnosis POA   . Compression fracture of L2 lumbar vertebra, closed, initial encounter Yes   . Closed compression fracture of lumbosacral spine, initial encounter Yes      Resolved Hospital Problems    Diagnosis POA   No resolved problems to display.       Treatment Team:   Treatment Team:   Attending Provider: Lowella Dandy, MD  Consulting Physician: Christel Mormon, MD     Procedures performed:   Radiology: all results from this admission  Ct T-spine Reconstruction    Result Date: 06/03/2017  1. Nondisplaced superior endplate fracture of L2 with approximately 22% loss of vertebral body height anteriorly and towards the left.   There is no osseous retropulsion or is there fracture extension into the posterior elements. 2.   No thoracic spine fracture is detected. Neldon Mc, MD 06/03/2017 1:51 PM    Xr Lumbar Spine Ap And Lateral    Result Date: 06/04/2017   Unremarkable appearance of L2 compression fracture in Geanie Cooley, MD 06/04/2017 10:39 AM    Xr Knee 1 Or 2 Views Left    Result Date: 06/04/2017   No acute bony process. Bea Laura, MD 06/03/2017 8:47 PM    Xr Knee 1 Or 2 Views Right    Result Date: 06/03/2017   No acute bony process. 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 Terrilee Croak, MD 06/04/2017 10:51 AM    Ct Angiogram Chest    Result Date: 06/03/2017   1. No acute injury in the chest, abdomen or pelvis. 2. Negative for pulmonary emboli. 3. Stable mildly  prominent mesenteric and retroperitoneal lymph nodes. Shelly Flatten, MD 06/03/2017 1:51 PM    Ct Abd/pelvis With Iv Contrast Only    Result Date: 06/03/2017   1. No acute injury in the chest, abdomen or pelvis. 2. Negative for pulmonary emboli. 3. Stable mildly prominent mesenteric and retroperitoneal lymph nodes. Shelly Flatten, MD 06/03/2017 1:51 PM    Ct L-spine Reconstruction    Result Date: 06/03/2017  1. Nondisplaced superior endplate fracture of L2 with approximately 22% loss of vertebral body height anteriorly and towards the left.   There is no osseous retropulsion or is there fracture extension into the posterior elements. 2.   No thoracic spine fracture is detected. Neldon Mc, MD 06/03/2017 1:51 PM      Reason for Admission:   Fall with lumbar compression fracture  Hospital Course:   66 y/o femal came to the ED on 8/21 s/p fall from standing 3 days prior. She had been carrying groceries home and lost her balance, falling backwards and hitting her head. At time of injury, she refused to go to the ER. During that time, she was not able to walk, so she was lifted and carried to her couch. During the days prior to admission,  she was having severe lower back pain with movement, esp getting in and out of bed. She tried to make and appt to see her Ortho surgeon, but she was not able to make an appt until Friday. So she came to the ER. On eval upon admission, she was found to have a nondisplaced superior endplate fracture of L2 with approximately 22%  loss of vertebral body height anteriorly.  She was seen by neuro spine, Dr. Bonnita Levan team and recommended for a TLSO brace which was fitted and alignment of spine with brace was confirmed by upright thoracoabdominal xrays.  Pt also had headache pain, n/v vomiting in the morning after her discharge which led [place] to get a head CT, esp with report of hitting her head during fall.  Head CT was negative as above.  Her headache improved with meds, and her nausea somewhat  improved but was deemed r/t either migraine or pain medication, especially narcotics which were changed.  She was seen by PT who mobilized pt with her brace and recommended home with home health PT.  Pt will discharge home today with supervision and home health PT f/u.  After discussion re: pain meds at discharge, pt would prefer to stay away from ibuprofen with h/o UC, but will likely need more than tylenol for pain with initial mobilization.  She has done with with tylenol III in the past so she was given a script for 5 days, then instructed to use tylenol along.  Her nausea, although better, persists and patient was instructed to seek care with her PCP if worsens.  She was told to abstain from her usual migraine therapy, exedrin migraine, while she's on tylenol III so as not to duplicate tylenol prescriptions.  She has f/u instructions to see our clinic, Dr Sherril Croon and her PCP upon discharge.     Condition at Discharge:   stable  Today:     BP 117/54   Pulse 72   Temp 97.7 F (36.5 C)   Resp 17   Ht 1.626 m (5\' 4" )   Wt 74.4 kg (164 lb)   SpO2 96%   BMI 28.15 kg/m   Ranges for the last 24 hours:  Temp:  [97.7 F (36.5 C)-98.8 F (37.1 C)] 97.7 F (36.5 C)  Heart Rate:  [63-73] 72  Resp Rate:  [16-18] 17  BP: (99-130)/(54-80) 117/54    General appearance - alert, well appearing, and in no distress.  Chest - clear to auscultation, no wheezes, rales or rhonchi, symmetric air entry  Heart - normal rate and regular rhythm, S1 and S2 normal  Abdomen - soft, nontender, nondistended, mild nausea, some vomiting after breakfast this am  Extremities - feet normal, good pulses, normal color, temperature and sensation, no edema, redness or tenderness in the calves or thighs      Last set of labs   Results     Procedure Component Value Units Date/Time    Basic Metabolic Panel [[id number]]  (Abnormal) Collected:  06/05/17 0539    Specimen:  Blood Updated:  06/05/17 0651     Glucose 91 mg/dL      BUN 9.3 mg/dL       Creatinine 0.6 mg/dL      Calcium 8.1 (L) mg/dL      Sodium 063 mEq/L      Potassium 4.1 mEq/L      Chloride 107 mEq/L      CO2 28 mEq/L      Anion Gap 7.0  GFR [[id number]] Collected:  06/05/17 0539     Updated:  06/05/17 0651     EGFR >60.0    Hemoglobin and hematocrit, blood [[id number]]  (Abnormal) Collected:  06/05/17 0539    Specimen:  Blood Updated:  06/05/17 0635     Hgb 11.0 (L) g/dL      Hematocrit 78.2 (L) %             Micro / Labs / Path pending:     Unresulted Labs     None                Discharge Instructions:     Follow-up Information     Levie Heritage, MD Follow up in 4 week(s).    Specialty:  Neurological Surgery  Why:  call to schedule (4-6 weeks with lumbar xrays)   Contact information:  [address] [facility]  [address]  [place] [address]  [phone]             [facility] Follow up in 2 day(s).    Why:  Home Health Visits  Contact information:  [address]  [place] [address]  [phone]           Lana Fish, MD Follow up in 2 week(s).    Specialty:  Internal Medicine  Why:  Follow-up in 2-4 weeks, sooner if nausea worsens  Contact information:  [address]  [address]  [place] [address]  [phone]             Lowella Dandy, MD. Schedule an appointment as soon as possible for a visit in 2 week(s).    Specialty:  Surgery  Contact information:  [address]  [address]  [place] [address]  [phone]                   Discharge Diet: Regular Diet          Disposition:  Home or Self Care     Discharge Medication List      Taking    Acetaminophen-Codeine 300-30 MG per tablet  Dose:  1 tablet  Commonly known as:  TYLENOL/CODEINE #3  Take 1 tablet by mouth every 4 (four) hours as needed for Pain.for up to 5 days     amitriptyline 25 MG tablet  Dose:  25 mg  Commonly known as:  ELAVIL  Take 25 mg by mouth nightly.     aspirin EC 81 MG EC tablet  Dose:  81 mg  Take 81 mg by mouth daily.     CALCIUM + D PO  Take by mouth.     COREG CR 10 MG 24 hr capsule  Dose:  20  mg  Generic drug:  carvedilol  20 mg.     CREON 36000 units Cpep  Generic drug:  Pancrelipase (Lip-Prot-Amyl)  Take by mouth 2 (two) times daily as needed.     * cyclobenzaprine 5 MG tablet  Dose:  5 mg  What changed:  Another medication with the same name was added. Make sure you understand how and when to take each.  Commonly known as:  FLEXERIL  Take 5 mg by mouth 3 (three) times daily as needed for Muscle spasms.     * cyclobenzaprine 5 MG tablet  Dose:  5 mg  What changed:  You were already taking a medication with the same name, and this prescription was added. Make sure you understand how and when to take each.  Commonly known as:  FLEXERIL  Take 1 tablet (5 mg total) by mouth 3 (three) times daily as needed for Muscle spasms.     cycloSPORINE 0.05 % ophthalmic emulsion  Dose:  1 drop  Commonly known as:  RESTASIS  1 drop as needed.     escitalopram 20 MG tablet  Dose:  20 mg  Commonly known as:  LEXAPRO  Take 20 mg by mouth daily.     fluticasone 27.5 MCG/SPRAY nasal spray  Dose:  2 spray  Commonly known as:  VERAMYST  2 sprays by Nasal route daily.     ibuprofen 600 MG tablet  Dose:  600 mg  Commonly known as:  ADVIL,MOTRIN  Take 600 mg by mouth every 6 (six) hours as needed for Pain.     levothyroxine 100 MCG tablet  Dose:  100 mcg  Commonly known as:  SYNTHROID, LEVOTHROID  Take 100 mcg by mouth daily.     mesalamine 1.2 g EC tablet  Dose:  1200 mg  Commonly known as:  LIALDA  Take 1,200 mg by mouth every morning with breakfast.     multivitamin tablet  Dose:  1 tablet  Take 1 tablet by mouth daily.     PRESERVISION AREDS 2 PO  Take by mouth.     raNITIdine 150 MG tablet  Dose:  150 mg  Commonly known as:  ZANTAC  Take 150 mg by mouth 2 (two) times daily.     UNKNOWN TO PATIENT  "xyzenta" for glaucoma.  Eye drops     VITAMIN B-12 SL  Dose:  1000 Unit  Take 1,000 Unit by mouth.     VITAMIN D2 PO  Dose:  4000 IU  Take 4,000 IU by mouth every morning.        * This list has 2 medication(s) that are the same  as other medications prescribed for you. Read the directions carefully, and ask your doctor or other care provider to review them with you.            STOP taking these medications    acetaminophen 500 MG tablet  Commonly known as:  TYLENOL          Minutes spent coordinating discharge and reviewing discharge plan: 50 minutes      Signed by: Thana Ates, PA-C  [facility]   Ext [phone]    I have directly reviewed the clinical findings, labs, imaging studies and management of this patient in detail. I have interviewed and examined the patient and agree with the documentation, assessment and plan Estalene Fonnie Mu as recorded by Myles Gip, PA-C.      Dossie Arbour, MD
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
## 2017-06-05 NOTE — PT Progress Note (Signed)
[facility]  [address]  [place], [place]. [address]    Department of Rehabilitation  [phone]    Physical Therapy Daily Treatment Note    Patient: Leslie Gross    MRN#: [id number]   M275/M275-A  Time of Treatment: Start Time: 4696 Stop Time: 1015  Time Calculation: 28 min  (25 PT treatment minutes)  PT Visit Number: 2    Patient's medical condition is appropriate for Physical Therapy intervention at this time.    Precautions: TLSO Back Brace Applied: OOB , no bending;no twisting;no lifting   Precaution Instructions Given to Patient: Yes  Other Precautions: Fall precautions      Assessment:  Patient appears to be on track and safe for home discharge, patient has met goals for gait and transfers.   Patient Goal: to go home      Plan:  Continue with Physical Therapy services to address functional mobility, endurance, and strength deficits.  Focus next session on: Bed mobility;Patient/family training;Endurance training;LE strengthening/ROM;Functional transfer training;Gait training;Stair training;Neuromuscular re-education   PT Frequency: 6-7x/wk   Based on today's session patient's discharge recommendation is the following:  Home with home health PT and supervision.  If Home Health PT is not available then the patient will need out patient PT.             Subjective:  Patient is agreeable to participation in the therapy session, hopes to go home.  Nursing clears patient for therapy.  Pt reports the step to pattern on stairs is how she has been managing stairs for many years.    Pain Assessment: Numeric Scale (0-10)  Pain Score: 2-mild pain  POSS Score: Slightly drowsy, easily aroused  Pain Location: Rib cage;Back  Pain Orientation: Right;Mid  Pain Intervention(s): Ambulation/increased activity;Repositioned (brace with standing)       Objective:  Observation of Patient/Vital Signs:  patient vital signs monitoring performed by nursing staff, see flow chart for details.  Patient is seated in a  bedside chair with dressings, telemetry and peripheral IV in place.   Pt seen for functional activities and exercises as noted:     Cognition/Neuro Status  Arousal/Alertness: Appropriate responses to stimuli  Attention Span: Appears intact  Orientation Level: Oriented X4  Memory: Appears intact  Following Commands: Follows all commands and directions without difficulty  Safety Awareness: minimal verbal instruction  Insights: Educated in safety awareness  Problem Solving: Able to problem solve independently  Behavior: calm;cooperative  Hand Dominance: right handed    Functional Mobility  Sit to Supine:  Log roll, instructions and supervision   Sit to Stand: Stand by Assist;Contact Guard Assist;Increased Time;Increased Effort;with instruction for hand placement to increase safety  Toilet Transfer:  Patient ambulated to bathroom, educated in/demonstrated safe toilet transfer; patient independent with clothing management; patient stood at sink to wash hands, no loss of balance with supervision.       Locomotion  Ambulation: Modified Independent;Supervision;with front-wheeled walker  Pattern: decreased cadence;L foot decreased clearance  Stair Management: Modified Independent;Supervision;two rails;step to pattern  Number of Stairs: 4  Distance Walked (Step 7): 135 Feet     Supine Therapeutic Exercise  Ankle Pumps:  B x 10-15  QS:  B x 10  GS:  x 10  Encouraged to perform AE exercises frequently for circulation, and to perform LE therapeutic exercise  2-3 times throughout the day to decrease effects of immobility.  Encouraged to use incentive spirometer and sit OOB as tolerated for pulmonary  Neuro Re-Ed  Sitting Balance: independent;without support  Standing Balance: modified independent;supervision;with support;with instruction;dynamic gait training;variable environment/backgrounds     Treatment Activities: Bed mobility, transfers, balance assessment, LE ROM and strength ex's, safety awareness and cognition  assessed, and activity tolerance noted.      Educated the patient to role of physical therapy and plan of care. Patient encouraged to sit in a chair for meals, and to ambulate as able with RN staff.   Patient left without needs and call bell within reach. RN notified of session outcome, trauma team in to check on pt.

## 2017-06-05 NOTE — Progress Notes (Signed)
06/05/17 1507   Discharge Disposition   Patient preference/choice provided? Yes   Physical Discharge Disposition Home with Needs   Name of Home Health Agency [facility]   Mode of Transportation Car   Pick up time spouse transport to home    Patient/Family/POA notified of transfer plan Patient informed only   Patient agreeable to discharge plan/expected d/c date? Yes   CM Interventions   Referral made for home health RN visit? Yes     Edwin Cap, RN, BSN  Case Manager- [facility]  [phone]

## 2017-06-05 NOTE — Progress Notes (Signed)
Patient cleared for discharge home by MD.  Peripheral IV removed and intact.  Discharge instructions reviewed with patient and all questions answered.  Scripts given and patient to follow up with medical care as directed.

## 2017-06-05 NOTE — Progress Notes (Signed)
PROGRESS NOTE        Date Time: 06/05/2017  1:42 PM  Patient Name:Leslie Gross  ZOX:[id number]  PCP: Lana Fish, MD  Attending Physician: Lana Fish, MD / Christel Mormon, MD      Chief Complaint:      Chief Complaint   Patient presents with   . Fall   . Back Pain       Subjective:   Feeling better today, pain level well controlled. Continued nausea this morning with headache, but feels better than yesterday. Denies fever, chills, shortness of breath, cough, chest pain, abdominal pain, vomiting, dysuria, constipation, diarrhea, headache, dizziness, and syncope      Assessment/Plan     Active Diagnosis: Active Problems:    Compression fracture of L2 lumbar vertebra, closed, initial encounter    Closed compression fracture of lumbosacral spine, initial encounter      HTN: Controlled. Continue home medications. Follows with PCP outpatient.    GERD: Patient does not use a PPI, she takes Zantac at home. Patient to elevate head of bed, avoid meals prior to bedtime, avoid caffeine, and avoid spicy food. Follows with PCP outpatient.    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 Follows with GI outpatient.    Hypothyroidism: Continue Synthroid daily. Follows with PCP outpatient.    Depression: Patient reports this to be stable. Continue home medications. Follows with PCP outpatient.    Low Back Pain: Secondary to mechanical fall and fracture. Patient prescribed on discharge by trauma team Tylenol III, then instructed to use OTC tylenol.     Lumbar Fracture: CT Lumbar Spine 06/03/17 showed a nondisplaced superior endplate fracture of L2. No plans for acute surgical intervention. TLSO brace in place. Repeat upright Lumbar Spine Xray demonstrating stable alignment of spinal column while in brace. Patient stable for discharge. Pain management as above.     Nausea and Vomiting: Likely secondary to pain medications/migraine. CT head negative. Prescribed  Tylenol III at discharge, patient reports doing well with medication. Follow up with PCP if symptoms worsen.    Headache: Patient reports hitting the back of her head during fall. 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 No acute abnormality. Pain management as above    D/w Trauma Surgery PA-C today. Patient stable for discharge. Patient will follow up with Dr. Sherril Croon and PCP as outpatient.    DVT Prohylaxis: Lovenox  Code Status: Full Code   Disposition: Home when stable  Prognosis: Fair  Type of Admission:Observation  Estimated Length of Stay (including stay in the ER receiving treatment): 2 midnights  Medical Necessity for stay: Monitoring for Lumbar Fx and LBP    Allergies:     Allergies   Allergen Reactions   . Shellfish-Derived Products Angioedema   . Diflucan [Fluconazole] Diarrhea   . Penicillins Nausea And Vomiting   . Tape Other (See Comments)     Causes blisters       Physical Exam:    height is 1.626 m (5\' 4" ) and weight is 74.4 kg (164 lb). Her temperature is 97.7 F (36.5 C). Her blood pressure is 117/54 and her pulse is 72. Her respiration is 17 and oxygen saturation is 96%.   Body mass index is 28.15 kg/m.  Vitals:    06/04/17 2058 06/05/17 0513 06/05/17 0813 06/05/17 1302   BP: 99/60 128/80 119/69 117/54   Pulse: 63 72 64 72   Resp: 18 18 17 17    Temp: 98.2 F (36.8 C)  97.9 F (36.6 C) 97.9 F (36.6 C) 97.7 F (36.5 C)   TempSrc: Oral Oral     SpO2: 90% 95% 97% 96%   Weight:       Height:         Intake and Output Summary (Last 24 hours) at Date Time    Intake/Output Summary (Last 24 hours) at 06/05/17 1342  Last data filed at 06/05/17 1252   Gross per 24 hour   Intake             2523 ml   Output             1150 ml   Net             1373 ml     Constitutional: Patient appears well-developed and well-nourished. In NAD.  Head: Normocephalic and atraumatic.  Eyes: Pupils equal and reactive, extraocular eye movements intact, sclera  anicteric.  Ears: External ear canals normal, right ear normal, left ear normal.  Nose: Normal and patent, no erythema, normal nontender sinuses.  Mouth: Mucous membranes moist, pharynx normal without lesions.  Neck: Normal range of motion. Neck supple. No JVD present. No tracheal deviation present. No thyromegaly present.   Cardiovascular: Normal rate, regular rhythm, normal heart sounds and intact distal pulses. Exam reveals no gallop and no friction rub. No murmur heard.  Pulmonary/Chest: Effort normal and breath sounds normal. No stridor. No respiratory distress. No wheezes. No rhonchi. No rales were present. Exhibits no tenderness.   Abdominal: Soft. Bowel sounds are normal. Patient exhibits no distension and no mass was palpable. There is no tenderness. There is no rebound and no guarding.   Musculoskeletal: TLSO Brace in place. Lumbar Spine TTP. Normal range of motion of B/L UEs and LEs. Patient exhibits no edema.   Lymphadenopathy:  Patient has no cervical adenopathy.   Neurological: Patient is alert and oriented to person, place, and time and has normal reflexes. No cranial nerve deficit.  Normal muscle tone. Coordination normal.   Skin: Skin is warm. No rash noted. Patient is not diaphoretic. No erythema. No pallor.   Psychiatric: Has normal mood and affect. Behavior is normal. Judgment and thought content normal.    Consult Input/Plan     Plan   [facility] FACE-TO-FACE (FTF) ENCOUNTER    Review of Systems:   A comprehensive review of systems has no changes since H&P was obtained except as mentioned in the subjective section.    Vitals 24 hrs:   Vitals:    06/04/17 2058 06/05/17 0513 06/05/17 0813 06/05/17 1302   BP: 99/60 128/80 119/69 117/54   Pulse: 63 72 64 72   Resp: 18 18 17 17    Temp: 98.2 F (36.8 C) 97.9 F (36.6 C) 97.9 F (36.6 C) 97.7 F (36.5 C)   TempSrc: Oral Oral     SpO2: 90% 95% 97% 96%   Weight:       Height:            Readmission:   Hospital Outpatient Visit on 12/08/2014    Component Date Value Ref Range Status   . WBC 12/08/2014 11.26* 3.50 - 10.80 x10 3/uL Final   . Hgb 12/08/2014 11.4* 12.0 - 16.0 g/dL Final   . Hematocrit 16/07/9603 36.6* 37.0 - 47.0 % Final   . Platelets 12/08/2014 301  140 - 400 x10 3/uL Final   . RBC 12/08/2014 3.73* 4.20 - 5.40 x10 6/uL Final   . MCV 12/08/2014 98.1  80.0 - 100.0 fL Final   . MCH 12/08/2014 30.6  28.0 - 32.0 pg Final   . MCHC 12/08/2014 31.1* 32.0 - 36.0 g/dL Final   . RDW 16/07/9603 13  12 - 15 % Final   . MPV 12/08/2014 10.8  9.4 - 12.3 fL Final   . Neutrophils 12/08/2014 68  None % Final   . Lymphocytes Automated 12/08/2014 24  None % Final   . Monocytes 12/08/2014 5  None % Final   . Eosinophils Automated 12/08/2014 2  None % Final   . Basophils Automated 12/08/2014 0  None % Final   . Immature Granulocyte 12/08/2014 0  None % Final   . Neutrophils Absolute 12/08/2014 7.68  1.80 - 8.10 x10 3/uL Final   . Abs Lymph Automated 12/08/2014 2.69  0.50 - 4.40 x10 3/uL Final   . Abs Mono Automated 12/08/2014 0.59  0.00 - 1.20 x10 3/uL Final   . Abs Eos Automated 12/08/2014 0.25  0.00 - 0.70 x10 3/uL Final   . Absolute Baso Automated 12/08/2014 0.05  0.00 - 0.20 x10 3/uL Final   . Absolute Immature Granulocyte 12/08/2014 0.04  0 x10 3/uL Final   . Glucose 12/08/2014 93  70 - 100 mg/dL Final    Comment: Interpretive Data for Adult Female and Female Population  Indeterminate Range:  100-125 mg/dL  Equal to or greater than 126 mg/dL meets the ADA  guidelines for Diabetes Mellitus diagnosis if symptoms  are present and confirmed by repeat testing.  Random (Non-Fasting)Interpretive Data (Adults):  Equal to or greater than 200 mg/dL meets the ADA  guidelines for Diabetes Mellitus diagnosis if symptoms  are present and confirmed by Fasting Glucose or GTT.     . BUN 12/08/2014 13.5  7.0 - 19.0 mg/dL Final   . Creatinine 54/06/8118 0.9  0.6 - 1.0 mg/dL Final   . Sodium 14/78/2956 140  136 - 145 mEq/L Final   . Potassium 12/08/2014 4.8  3.5 - 5.1 mEq/L Final    . Chloride 12/08/2014 108  100 - 111 mEq/L Final   . CO2 12/08/2014 23  22 - 29 mEq/L Final   . Calcium 12/08/2014 9.0  8.5 - 10.5 mg/dL Final   . Protein, Total 12/08/2014 6.2  6.0 - 8.3 g/dL Final   . Albumin 21/30/8657 3.5  3.5 - 5.0 g/dL Final   . AST (SGOT) 84/69/6295 22  5 - 34 U/L Final   . ALT 12/08/2014 20  0 - 55 U/L Final   . Alkaline Phosphatase 12/08/2014 131* 37 - 106 U/L Final   . Bilirubin, Total 12/08/2014 0.4  0.2 - 1.2 mg/dL Final   . Globulin 28/41/3244 2.7  2.0 - 3.6 g/dL Final   . Albumin/Globulin Ratio 12/08/2014 1.3  0.9 - 2.2 Final   . Anion Gap 12/08/2014 9.0  5.0 - 15.0 Final   . Ferritin 12/08/2014 307.65* 4.60 - 204.00 ng/mL Final   . Iron 12/08/2014 60  40 - 145 ug/dL Final   . UIBC 10/16/7251 206  126 - 382 ug/dL Final   . TIBC 66/44/0347 266  265 - 497 ug/dL Final   . Iron Saturation 12/08/2014 23  15 - 50 % Final   . Folate 12/08/2014 39.3  See below ng/mL Final    Comment: Deficient    : <3.5 ng/mL  Intermediate : 3.5 - 5.4 ng/mL  Normal       : Greater Than 5.4 ng/mL     . Vitamin  B-12 12/08/2014 >2000* 211 - 911 pg/mL Final   . Reticulocyte Count Automated 12/08/2014 3.0* 0.5 - 2.5 % Final   . Retic Ct Abs 12/08/2014 0.1120  0.0210 - 0.1350 x10 6/uL Final   . Immature Retic Fract 12/08/2014 15.0  3.0 - 15.9 % Final   . Immature Plt Fraction 12/08/2014 2.6  0.9 - 11.2 % Final   . Retic Hgb 12/08/2014 36.2  28.2 - 36.6 pg Final   . Hemolysis Index 12/08/2014 3  0 - 18 Final   . EGFR 12/08/2014 >60.0   Final    Comment: Disease State Reference Ranges:    Chronic Kidney Disease; < 60 ml/min/1.73 sq.m    Kidney Failure; < 15 ml/min/1.73 sq.m    [Calculated using IDMS-Traceable MDRD equation (based on    gender, age and black vs. non-black race) recommended by    [employer] Kidney Disease Education Program. No data    available for non-white, non-black race.]  GFR estimates are unreliable in patients with:    Rapidly changing kidney function or recent dialysis,    extreme age, body size  or body composition(obesity,    severe malnutrition). Abnormal muscle mass (limb    amputation, muscle wasting). In these patients,    alternative determinations of GFR should be obtained.          Coagulation Profile:   Recent Labs  Lab 06/03/17  1201   PT 12.4*   PT INR 0.9   PTT 26          Medications:   Current Facility-Administered Medications   Medication Dose Route Frequency Last Rate Last Dose   . 0.9%  NaCl infusion   Intravenous Continuous 100 mL/hr at 06/04/17 1812     . acetaminophen (TYLENOL) tablet 650 mg  650 mg Oral Q4H PRN   650 mg at 06/04/17 1937   . amitriptyline (ELAVIL) tablet 25 mg  25 mg Oral QHS   25 mg at 06/04/17 2106   . butalbital-acetaminophen-caffeine (FIORICET, ESGIC) per tablet 1 tablet  1 tablet Oral Q4H PRN   1 tablet at 06/04/17 1818   . carvedilol (COREG) tablet 3.125 mg  3.125 mg Oral Q12H SCH   3.125 mg at 06/05/17 0946   . cyclobenzaprine (FLEXERIL) tablet 5 mg  5 mg Oral TID PRN       . enoxaparin (LOVENOX) syringe 30 mg  30 mg Subcutaneous Q12H   30 mg at 06/05/17 0946   . escitalopram (LEXAPRO) tablet 20 mg  20 mg Oral Daily   20 mg at 06/05/17 0946   . famotidine (PEPCID) tablet 20 mg  20 mg Oral Q12H SCH   20 mg at 06/05/17 0946    Or   . famotidine (PEPCID) injection 20 mg  20 mg Intravenous Q12H SCH       . hydrALAZINE (APRESOLINE) injection 10 mg  10 mg Intravenous Q6H PRN       . ketorolac (TORADOL) injection 15 mg  15 mg Intravenous Q8H SCH   15 mg at 06/05/17 0534   . levothyroxine (SYNTHROID, LEVOTHROID) tablet 100 mcg  100 mcg Oral Daily at 0600   100 mcg at 06/05/17 0534   . mesalamine (DELZICOL) capsule 400 mg  400 mg Oral TID MEALS   400 mg at 06/05/17 1233   . morphine injection 2 mg  2 mg Intravenous Q2H PRN       . naloxone (NARCAN) injection 0.2 mg  0.2 mg Intravenous PRN       .  ondansetron (ZOFRAN) tablet 4 mg  4 mg Oral Q8H PRN        Or   . ondansetron (ZOFRAN) injection 4 mg  4 mg Intravenous Q8H PRN   4 mg at 06/04/17 1812   . promethazine  (PHENERGAN) tablet 25 mg  25 mg Oral Q6H PRN        Or   . promethazine (PHENERGAN) suppository 12.5 mg  12.5 mg Rectal Q6H PRN        Or   . promethazine (PHENERGAN) injection 12.5 mg  12.5 mg Intravenous Q6H PRN   12.5 mg at 06/04/17 1249   . traMADol (ULTRAM) tablet 50 mg  50 mg Oral Q6H PRN            CBC review:   Recent Labs  Lab 06/05/17  0539 06/04/17  1132 06/03/17  1201   WBC  --  7.36 8.73   Hgb 11.0* 11.3* 11.8*   Hematocrit 36.3* 36.0* 38.4   Platelets  --  211 229   MCV  --  97.0 98.7   RDW  --  13 13   Neutrophils  --   --  67.5   Lymphocytes Automated  --   --  21.2   Eosinophils Automated  --   --  3.6   Immature Granulocyte  --   --  0.3   Neutrophils Absolute  --   --  5.89   Absolute Immature Granulocyte  --   --  0.03        Chem Review:  Recent Labs  Lab 06/05/17  0539 06/04/17  1132 06/03/17  1201   Sodium 142 139 141   Potassium 4.1 5.1 5.2*   Chloride 107 104 107   CO2 28 29 28    BUN 9.3 8.9 14.2   Creatinine 0.6 0.6 0.7   Glucose 91 93 125*   Calcium 8.1* 8.4* 8.6   Bilirubin, Total  --   --  0.4   AST (SGOT)  --   --  21   ALT  --   --  24   Alkaline Phosphatase  --   --  140*        Labs:     Results     Procedure Component Value Units Date/Time    Basic Metabolic Panel [[id number]]  (Abnormal) Collected:  06/05/17 0539    Specimen:  Blood Updated:  06/05/17 0651     Glucose 91 mg/dL      BUN 9.3 mg/dL      Creatinine 0.6 mg/dL      Calcium 8.1 (L) mg/dL      Sodium 865 mEq/L      Potassium 4.1 mEq/L      Chloride 107 mEq/L      CO2 28 mEq/L      Anion Gap 7.0    GFR [[id number]] Collected:  06/05/17 0539     Updated:  06/05/17 0651     EGFR >60.0    Hemoglobin and hematocrit, blood [[id number]]  (Abnormal) Collected:  06/05/17 0539    Specimen:  Blood Updated:  06/05/17 0635     Hgb 11.0 (L) g/dL      Hematocrit 10.2 (L) %         Rads:   Radiological Procedure reviewed.  Radiology Results (24 Hour)     ** No results found for the last 24 hours. **          Time spent for evaluation,  management and coordination of care:   35 minutes          Signed by:     Kathlee Nations Avenly Roberge,MD    06/05/2017 1:42 PM

## 2017-06-09 ENCOUNTER — Other Ambulatory Visit (INDEPENDENT_AMBULATORY_CARE_PROVIDER_SITE_OTHER): Payer: Self-pay | Admitting: Nurse Practitioner

## 2017-06-09 DIAGNOSIS — S32020A Wedge compression fracture of second lumbar vertebra, initial encounter for closed fracture: Secondary | ICD-10-CM

## 2017-06-14 DIAGNOSIS — T148XXA Other injury of unspecified body region, initial encounter: Secondary | ICD-10-CM

## 2017-06-14 HISTORY — DX: Other injury of unspecified body region, initial encounter: T14.8XXA

## 2017-06-17 ENCOUNTER — Ambulatory Visit
Admission: RE | Admit: 2017-06-17 | Discharge: 2017-06-17 | Disposition: A | Discharge status: Home / Self Care | Payer: Medicare Other | Source: Ambulatory Visit | Attending: Internal Medicine | Admitting: Internal Medicine

## 2017-06-17 ENCOUNTER — Other Ambulatory Visit: Payer: Self-pay | Admitting: Internal Medicine

## 2017-06-17 DIAGNOSIS — R0789 Other chest pain: Secondary | ICD-10-CM | POA: Insufficient documentation

## 2017-06-17 LAB — IHS D-DIMER: D-Dimer: 0.49 ug/mL FEU (ref 0.00–0.70)

## 2017-07-07 ENCOUNTER — Ambulatory Visit
Admission: RE | Admit: 2017-07-07 | Discharge: 2017-07-07 | Disposition: A | Discharge status: Home / Self Care | Payer: Medicare Other | Source: Ambulatory Visit | Attending: Nurse Practitioner | Admitting: Nurse Practitioner

## 2017-07-07 DIAGNOSIS — S32020A Wedge compression fracture of second lumbar vertebra, initial encounter for closed fracture: Secondary | ICD-10-CM | POA: Insufficient documentation

## 2017-07-08 NOTE — Progress Notes (Signed)
[facility] Neurosurgery  Follow up Note    Previous Impression/Plan      Assessment:   66 y.o. female with osteoporosis s/p fall on 05/31/17 sustaining a nondisplaced superior endplate L2 fracture with minimal LOH and no retropulsion. Back pain controlled with current PO regimen.      Plan:   TLSO brace   Upright lumbar xrays in TLSO brace   If stable alignment when upright in brace- ok to proceed with PT/OT   Pain management per primary team  Wear brace at all times when out of bed or sitting upright in a chair.  Ok to apply brace on edge of bed.  F/u in 4-6 weeks with lumbar xray     Signed by: Sabino Snipes, NP-C     ADDENDUM:  AP/Lateral lumbar xrays obtained- stable alignment.   Ok for PT/OT, activity as tolerated      Sabino Snipes, NP  3:55 PM 06/04/17      HPI   CC: L2 compression fracture    Leslie Gross 66 y.o. 1 month s/p fall sustaining a nondisplaced superior endplate L2 fracture with minimal LOH and no retropulsion. She was treated conservatively in a TLSO brace and presents today for routine follow up and lumbar xrays. She has some back stiffness but no other complaints. She is wearing her TLSO brace all the time during the day.    Physical Examination   VITAL SIGNS:   weight is 73.9 kg (163 lb). Her temperature is 98 F (36.7 C). Her blood pressure is 111/65 and her pulse is 71.        Neurologic Exam    Mental Status   Alert   Speech clear     Cranial Nerves     CN III, IV, VI   Pupils are equal, round, and reactive to light.  Extraocular motions are normal.     CN VII: Facial expression full, symmetric.     CN VIII: Hearing intact b/l    CN XI: Shoulder shrug symmetric    Motor Exam   Overall muscle tone: normal    Strength   Right deltoids (5/5), biceps (5/5), triceps (5/5), grip (5/5), intrinsics (5/5)  Left deltoids (5/5), biceps (5/5), triceps (5/5), grip (5/5), intrinsics (5/5)  Right iliopsoas (5/5), quadriceps (5/5), dorsiflexion (5/5), plantar flexion (5/5), EHL  (5/5)  Left iliopsoas (5/5), quadriceps (5/5), dorsiflexion (5/5), plantar flexion (5/5), EHL (5/5)     Gait: normal    Review of Systems   Review of Systems  Constitutional: negative for fever or chills.  HENT: Negative for neck stiffness.   Eyes: Negative for visual disturbance.   Musculoskeletal: Negative for gait problem.   Skin: Negative for wounds.  Neurological: no history of seizures.    Radiology Interpretation   Xr Ap & Lateral, Lumbar Spine    Result Date: 07/07/2017  CLINICAL INFORMATION: 66 year old. Follow-up L2 compression fracture. TECHNIQUE AND FINDINGS: Lumbar spine: Standing AP, lateral, lateral spot. Comparison with standing lateral film on 06/04/2017. There is a compression fracture involving superior endplate of L2 with further loss of height compared with the prior study. Loss of height centrally estimated to be approximately 15%. No retropulsion. No other compression fractures. No destructive bone lesions. Well-maintained disc spaces. No degenerative changes.      Further loss of height of L2 compression fracture as described above. Bony structures otherwise unremarkable. Comment: Findings discussed with Luciana Axe at the time of dictation. Annabell Sabal,  MD 07/07/2017 11:15 AM    Impression   66 y.o. female 1 month s/p fall sustaining a nondisplaced superior endplate L2 fracture with progressive LOH ~15%, but with no retropulsion or kyphosis.     Plan   1. TLSO brace more liberally for the next month and then likely discontinue if x-rays stable  2. Please call with questions or problems    Follow-up   4 weeks    Luciana Axe, NP   Levie Heritage, MD

## 2017-07-09 ENCOUNTER — Ambulatory Visit (INDEPENDENT_AMBULATORY_CARE_PROVIDER_SITE_OTHER): Payer: Medicare Other | Admitting: Neurological Surgery

## 2017-07-09 VITALS — BP 111/65 | HR 71 | Temp 98.0°F | Wt 163.0 lb

## 2017-07-09 DIAGNOSIS — S32020A Wedge compression fracture of second lumbar vertebra, initial encounter for closed fracture: Secondary | ICD-10-CM

## 2017-08-05 ENCOUNTER — Ambulatory Visit
Admission: RE | Admit: 2017-08-05 | Discharge: 2017-08-05 | Disposition: A | Discharge status: Home / Self Care | Payer: Medicare Other | Source: Ambulatory Visit | Attending: Neurological Surgery | Admitting: Neurological Surgery

## 2017-08-05 DIAGNOSIS — S32020A Wedge compression fracture of second lumbar vertebra, initial encounter for closed fracture: Secondary | ICD-10-CM | POA: Insufficient documentation

## 2017-08-06 ENCOUNTER — Ambulatory Visit (INDEPENDENT_AMBULATORY_CARE_PROVIDER_SITE_OTHER): Payer: Medicare Other | Admitting: Neurological Surgery

## 2017-08-06 VITALS — BP 117/82 | HR 69 | Resp 17 | Wt 171.0 lb

## 2017-08-06 DIAGNOSIS — S32020A Wedge compression fracture of second lumbar vertebra, initial encounter for closed fracture: Secondary | ICD-10-CM

## 2017-08-06 NOTE — Progress Notes (Signed)
[facility]  Follow up Note    Previous Impression/Plan    (07/09/17)   Impression   66 y.o. female 1 month s/p fall sustaining a nondisplaced superior endplate L2 fracture with progressive LOH ~15%, but with no retropulsion or kyphosis.     Plan   1. TLSO brace more liberally for the next month and then likely discontinue if x-rays stable  2. Please call with questions or problems    Follow-up   4 weeks      HPI   CC: L2 compression fracture    Leslie Gross 66 y.o. 3 months s/p fall sustaining a nondisplaced superior endplate L2 fracture with minimal LOH and no retropulsion. She was treated conservatively in a TLSO brace and presents today for routine follow up and lumbar xrays. She reports occasional midline low back pain when the brace is off. She denies needing to take anything for pain control. She denies numbness/tingling or LE weakness.     Physical Examination   VITAL SIGNS:   weight is 77.6 kg (171 lb). Her blood pressure is 117/82 and her pulse is 69. Her respiration is 17.        Neurologic Exam    Mental Status   Alert   Speech clear     Cranial Nerves     CN III, IV, VI   Pupils are equal, round, and reactive to light.  Extraocular motions are normal.     CN VII: Facial expression full, symmetric.     CN VIII: Hearing intact b/l    CN XI: Shoulder shrug symmetric    Motor Exam   Overall muscle tone: normal    Strength   BUE 5/5  Right iliopsoas (5/5), quadriceps (5/5), dorsiflexion (5/5), plantar flexion (5/5)  Left iliopsoas (5/5), quadriceps (5/5), dorsiflexion (5/5), plantar flexion (5/5)    Gait: normal    Review of Systems   Review of Systems  Constitutional: negative for fever or chills.  HENT: Negative for neck stiffness.   Eyes: Negative for visual disturbance.   Musculoskeletal: Negative for gait problem.   Skin: Negative for wounds.  Neurological: no history of seizures.    Radiology Interpretation   CLINICAL INFORMATION: 66 year old. Follow-up L2 compression  fracture.     TECHNIQUE AND FINDINGS: Lumbar spine: Standing AP and lateral.  Comparison with 07/07/2017.     There is a compression fracture of the superior endplate of L2 with  about 15% loss of height centrally. No retropulsion. Appearance is  stable. No other compression fractures. No focal destructive bone  lesions. No significant disc space narrowing or marginal osteophyte  formation.     IMPRESSION:    Stable compression fracture superior endplate of L2.     Leslie Sabal, MD   08/05/2017 11:18 AM      Impression   65 y.o. female 3 months s/p fall sustaining a nondisplaced superior endplate L2 fracture with progressive LOH ~15%, but with no retropulsion or kyphosis. Follow up xrays stable.     Plan   1. Ok to d/c TLSO brace  2. Referral to PT for core strengthening and massage therapy   3. Increase activity as tolerated     Follow-up   PRN     Luciana Axe, NP   Levie Heritage, MD

## 2017-08-08 ENCOUNTER — Ambulatory Visit: Payer: Medicare Other

## 2017-08-11 ENCOUNTER — Ambulatory Visit: Payer: Medicare Other | Attending: Nurse Practitioner

## 2017-08-11 DIAGNOSIS — G4486 Cervicogenic headache: Secondary | ICD-10-CM | POA: Insufficient documentation

## 2017-08-11 DIAGNOSIS — M6281 Muscle weakness (generalized): Secondary | ICD-10-CM | POA: Insufficient documentation

## 2017-08-11 DIAGNOSIS — M545 Low back pain, unspecified: Secondary | ICD-10-CM | POA: Insufficient documentation

## 2017-08-11 DIAGNOSIS — S32020A Wedge compression fracture of second lumbar vertebra, initial encounter for closed fracture: Secondary | ICD-10-CM

## 2017-08-11 DIAGNOSIS — R51 Headache: Secondary | ICD-10-CM | POA: Insufficient documentation

## 2017-08-11 DIAGNOSIS — M542 Cervicalgia: Secondary | ICD-10-CM | POA: Insufficient documentation

## 2017-08-11 DIAGNOSIS — G8929 Other chronic pain: Secondary | ICD-10-CM | POA: Insufficient documentation

## 2017-08-11 HISTORY — DX: Cervicogenic headache: G44.86

## 2017-08-11 HISTORY — DX: Low back pain, unspecified: M54.50

## 2017-08-11 NOTE — PT Eval Note (Signed)
[facility]  [address]  [place], [place]  [address]  Phone:  [phone]  Fax:  [phone]    PHYSICAL THERAPY EVALUATION AND PLAN OF CARE           Referred By: Sabino Snipes, NP    *I agree to the plan of care stated below*                    Luciana Axe, NP   08/18/17                                                                                                                       Physician Signature      Date      PATIENT: Leslie Gross DOB: September 27, 1951   MR #: [id number]  AGE: 66 y.o.    FACILITY PROVIDER #: [id number] PRIMARY MD: Lana Fish, MD    HICN# Medicare Sub. Num: [id number] DIAGNOSES: Neck pain [M54.2]; s/p L2 compression fracture S32.020A       Date of Service PT Received On: 08/11/17   Treatment Time Start Time: 1308 to Stop Time: 1400   Time Calculation Time Calculation (min): 52 min   Visit # PT Visit  PT Visit Number: 1/25   Units Billed PT Evaluation  $ PT Evaluation Moderate Complexity (97162): 1 Procedure Therapeutic Interventions  $ PT Manual Therapy (97140): 1 Unit     Certification Period:  08/11/17 to 11/03/17    Treatment Diagnosis:  Neck Pain [M54.2], Painful Cervical ROM [M54.2], Chronic Lower Back Pain without Radiculopathy  [M54.5, G89.29, Generalized Muscle Weakness [M62.81] and cervicogenic headache R51    Date of onset: 05/30/17    Imaging Performed:  See imaging in Epic.    Precautions:   Fall Risk  Orthopedic Injury:  ankle fracture  Osteoperosis    Medications listed in EMR:  has a current medication list which includes the following prescription(s): amitriptyline, aspirin ec, calcium citrate-vitamin d, carvedilol, creon,  cyclobenzaprine,  ergocalciferol, escitalopram, fluticasone, levothyroxine, multivitamin, multiple vitamins-minerals, ranitidine, Lialda, Vyzulta solution and Escitalopram.  Patient/Caregiver does report changes to medication at this time. List above is updated with the current  meds.    Allergies:  Allergies   Allergen Reactions   . Shellfish-Derived Products Angioedema   . Diflucan [Fluconazole] Diarrhea   . Penicillins Nausea And Vomiting   . Tape Other (See Comments)     Causes blisters       EXAMINATION / EVALUATION:    Past Medical History:  Leslie Gross  has a past medical history of Anemia; Arrhythmia (Dg 2008); Blood transfusion without reported diagnosis (2012); Deep venous thrombosis of calf; Difficulty in walking(719.7); GERD (gastroesophageal reflux disease); Glaucoma; Headache(784.0); History of acute pancreatitis (2012); Hypothyroidism; Mitral valve prolapse; Osteoporosis; Pulmonary embolism; Sleep apnea; and Ulcerative colitis, chronic.    Past Surgical History:  Leslie Gross  has a past surgical history that includes Pancreas surgery (  2012); Dilation and curettage of uterus (2011); Cystostomy w/ bladder biopsy; Abdominal surgery (2006); Esophagogastroduodenoscopy (2011); Lung biopsy (1998); Ankle Fusion (1981); Knee surgery (1981); Breast surgery (1610-9604); Cesarean section (1990s); Cardiac catheterization (2009); Shoulder muscle transfer (1972); Tonsillectomy; Gastric bypass; EGD, DILATION (N/A, 12/06/2016); Colonoscopy (N/A, 12/06/2016); Fracture surgery; and Joint replacement (2012).        Social History:  Leslie Gross reports good social support system at home..  Treyana reports adequate housing and no significant barriers to household mobility.  DME Owned: Single point cane and 2-Wheel Walker; bed w/ elevated head.    Patient presents for physical therapy today alone.     HISTORY:   Leslie Gross is a 66 y.o. female sustained L2 fx s/p fall at home.  Pt was in TLSO brace until recently.  Wasn't able to move around much in the brace and has gotten weak since she wasn't able to do much.      Patient Goal: pain relief and better movement  Subjective Report: Pt reports taking flexeril early this morning     Pain:  4-5/10 neck pain; 2/10 pain.     Level of  function:    Prior level of function Level of function at eval     No difficulty sleeping Can't get a good night's rest due to neck and back pain   NO issues w/ driving Not able to look over shoulder while driving due to neck pain and tightness.   No HA or pressure Pressure from neck goes up to top of head.    Reading/looking at phone causes the neck stiffness.    HA with neck stiffness--everyday.    Rt side of neck/face feel swollen/stiff.    Sit >1 hr and not able to standing >10 min causes back pain.    Neck pain w/ carrying grocery bags.    Can't walk >20 min max due to back pain.     REVIEW OF SYSTEMS:  Communication:  Alert and oriented to person, place and time.  Emotional/Behavioral responses appear appropriate at this time.  Demonstrates ability to make needs known.    Cardiopulmonary:  Seated Vital Signs:  left upper extremity: Blood Pressure:  140/66  Pulse: 70  Integumentary: Unremarkable.     Musculoskeletal System:     INITIAL EVAL   AROM neck  INITIAL EVAL   AROM L-spine   Flx: 26 deg w/ pulling, ext: 23 deg w/ pain Flxn: 3" below tibial tuberosity   SB Rt: 18 w/ p!;  Lt: 16 w/ p! SB: Rt: 4 in above joint line w/ p!; Lt 3.5" above joint line w/ p! In superior lumbar region   Rotation: Rt: 36 w/ p!; Lt: 32 w/ p! Rot'n: Rt 23 deg Lt 25 deg (supine hook lying) w/ 3-4/10 p! across low back.   PROM: upper cervical rotation decreased by 50% bil; SB decreased by 50% bil w/ p! Rt side; rot'n decreased by >50% bil w/ neck pain. HS flexibility: 90/90 HS Rt (-) 35 deg, Lt (-) 38 deg (opposite knee flexed)         INITIAL EVAL  STRENGTH  INITIAL EVAL    STRENGTH   Shoulder flxn: Rt 3+/5 Lt 4-/5 Hip flxn: Rt: 4-/5; Lt: 4/5 bil w/ p! In lumbar spine (sittinge)   Shoulder abd: Rt/Lt 3+/5 Hip abd: Rt/Lt 3/5 (standing)    Hip ext: Rt/Lt 2/5 (standing--min past neutral)     Palpation: -mod tightness in UTs and cervical erector spinae  to T3; +TTP in UTs; muscle guarding preventing passive joint mobility in cervical  spine. +TTP Rt SCM (SCM anterior border to from mastoid TTP about half way down with associated jaw pain). +TTP to bil occiput Rt>Lt;    Joint Mobility: no p/a glide at C-spine; L-spine: not tested    Edema: No swelling / edema noted at neck.    Posture: forward head, rounded shoulders    Neuromuscular System:  Sensation: Not assessed today.    Reflexes: not assessed today.    Coordination: Not assessed today.    INITIAL EVAL    Functional Mobility and transfers:    Patient is independent with household and community mobility.   Balance:   Not assessed today.   Gait: Limited pelvic rotation noted; No hip extension  no assistive device     Outcomes:   INITIAL EVAL    NDI: 44%        Oswestry: 48%     Patient Education:   Patient was educated on goals and benefits of therapy, as well as HEP for neck and Rt ear pull technique to decrease pressure in Rt ear..  Pt. did demonstrate an understanding of this education.  Home Exercise Program was issued--see copy in chart.  Treatment Today:  Evaluation, Patient education., HEP issued. and Manual therapy:  STM to upper cervical erector spinae, passive stretch of upper cervicals into flexion.  Occipital release--pt unable to tol due to increased pressure on head and face.    EVALUATION AND DIAGNOSIS:   RONIKA KELSON is a 66 y.o. female presents to physical therapy with dx for neck pain and s/p L2 fracture.  Pt w/ c/o neck pain and stiffness > low back pain and stiffness.   She has significant tightness in upper cervical erector spinae, bil SCM, bil UTs, anterior neck muscles, mod decrease in upper cervical rot'n and cervical AROM. These could be the cause of her c/o HAs, increased ear pressure and facial pressure.  Pt has mod difficulty w/ relaxing during manual therapy and PROM of neck.  Pt's recovery may be prolonged due to the two separate diagnosis for this plan of care.  She will benefit from skilled PT intervention to improve ROM, decrease pain and stiffness and  restore normal function.    Impairments in body function and structure:  Decreased PROM  Decreased AROM  Decreased Strength   Pain  Postural Dysfunction  See above review of systems for details.    Activity Limitations / Restricted Participation:  Can't get a good night's rest due to neck and back pain   Not able to look over shoulder while driving due to neck pain and tightness.   Pressure from neck goes up to top of head.   Reading/looking at phone causes the neck stiffness.   HA with neck stiffness--everyday.   Rt side of neck/face feel swollen/stiff.   Sit >1 hr and not able to standing >10 min causes back pain.   Neck pain w/ carrying grocery bags.   Can't walk >20 min max due to back pain.     PROGNOSIS:  Good for goals.  Recovery may take long as pt has two diagnosis that are being treated in one treatment session.    Without skilled intervention, patient may not be able to:  Drive safely, participate in IADLs w/o difficulty, be able to get good night's rest.  Goals:  Short Term Goals:  To be met by 8 visits  1 Increase active cervical rotation  to >50 degrees for increased visual field to progress pt to be safe with changing lanes and backing up vehicle while driving.  2 Pt. will report >50% improvement in ability to sleep through night without report of interruptions due to pain.  3 Pt. will report decrease in frequency of headaches to less than 5 times per week.  4 Improve NDI and Oswestry to  <35%  indicating improved fucntion and decreased pain.    Long Term Goals:  To be met by 24 visits  4 Increase active cervical rotation to >60 degrees for increased visual field to allow safe changing lanes and backing up vehicle while driving w/ no > 1/61 neck pain.  5 Pt. will report ability to sleep through night without report of interruptions due to pain.  6 Increase strength of bilateral hips to at least 4/5  to improve patients ability to walk >20 minutes for grocery shopping, walking dog.  7  Pt will report  >75% improvement in current functional status for improved quality of life.  8 Improve NDI and Oswestry to <20% indicating improved function and decreased pain.    PLAN:  97530 Therapeutic Activities, to include functional mobility training.  09604 Therapeutic Exercises, to include home exercise program.  [phone] Neuromuscular Re-education, to include balance training.  11914 Manual Therapy  Modalities: 97014 Unattended Estim  97035 Ultrasound  Patient/Caregiver Education and Training    Frequency of treatment:    2 times per week for 12 weeks.    It has been a pleasure to evaluate Shifa A Letitia Libra.  Please contact me with any questions or concerns regarding this patient's evaluation or ongoing therapy.     Therapist Signature:    [name], PT, MPT [phone]  Outpatient Speciality Rehab  Physical Medicine and Rehabilitation  [facility]  P: [phone]     08/15/2017      CPT Evaluation Code Justification  CRITERIA JUSTIFICATION DESCRIPTION   Personal factors and/or co-morbidities that impact the plan of care. Factors that affect plan of care include:  Osteoarthritis Migraine history Chronic pain Generalized weakness 3 (High Complexity)     Body Systems Examined Impairments noted in:  Musculoskeletal  Cardiovascular  Body structures/regions 3 or more elements (Moderate Complexity)     Clinical Presentation Presentation varies throughout session, days. Evolving (Moderate Complexity)     Clinical Decision Making Standardized Assessment used:  See below for details. Evaluation Code:  Moderate Complexity

## 2017-08-13 ENCOUNTER — Ambulatory Visit: Payer: Medicare Other

## 2017-08-13 DIAGNOSIS — M542 Cervicalgia: Secondary | ICD-10-CM

## 2017-08-13 DIAGNOSIS — G8929 Other chronic pain: Secondary | ICD-10-CM

## 2017-08-13 DIAGNOSIS — G4486 Cervicogenic headache: Secondary | ICD-10-CM

## 2017-08-13 DIAGNOSIS — S32020A Wedge compression fracture of second lumbar vertebra, initial encounter for closed fracture: Secondary | ICD-10-CM

## 2017-08-13 LAB — VAHRT HISTORIC LVEF: Ejection Fraction: 61 %

## 2017-08-13 NOTE — Progress Notes (Signed)
[facility]  [address]  [place], [place]  [address]  Phone:  [phone]  Fax:  [phone]    PHYSICAL THERAPY DAILY TREATMENT NOTE    PATIENT: Leslie Gross DOB: 1950/11/02   MR #: [id number]  AGE: 66 y.o.    FACILITY PROVIDER #: [id number] PRIMARY MD: Leslie Fish, MD    HICN# Medicare Sub. Num: [id number] DIAGNOSES: Neck pain [M54.2]      Date of Service PT Received On: 08/13/17   Treatment Time Start Time: 1305 to Stop Time: 1400   Time Calculation Time Calculation (min): 55 min   Visit # PT Visit  PT Visit Number: 2/25   Units Billed   Therapeutic Interventions  $ PT Therapeutic Exercise (97110): 1 Unit  $ PT Manual Therapy (02725): 1 Unit     Leslie Gross referred for physical therapy services by: Leslie Snipes, NP    Certification period, precautions, medications and allergies and goals copied from initial evaluation - reviewed and reconciled today.  Leslie Gross, PT 08/13/2017    Certification Period:  08/11/17 to 11/03/17    Treatment Diagnosis:  Neck Pain [M54.2], Painful Cervical ROM [M54.2], Chronic Lower Back Pain without Radiculopathy  [M54.5, G89.29, Generalized Muscle Weakness [M62.81] and cervicogenic headache R51    Date of onset: 05/30/17    Imaging Performed:  See imaging in Epic.    Precautions:   Fall Risk  Orthopedic Injury:  ankle fracture  Osteoporosis    Goals:  Short Term Goals:  To be met by 8 visits  1 Increase active cervical rotation to >50 degrees for increased visual field to progress pt to be safe with changing lanes and backing up vehicle while driving.  2 Pt. will report >50% improvement in ability to sleep through night without report of interruptions due to pain.  3 Pt. will report decrease in frequency of headaches to less than 5 times per week.  4 Improve NDI and Oswestry to  <35%  indicating improved fucntion and decreased pain.    Long Term Goals:  To be met by 24 visits  4 Increase active  cervical rotation to >60 degrees for increased visual field to allow safe changing lanes and backing up vehicle while driving w/ no > 3/66 neck pain.  5 Pt. will report ability to sleep through night without report of interruptions due to pain.  6 Increase strength of bilateral hips to at least 4/5  to improve patients ability to walk >20 minutes for grocery shopping, walking dog.  7  Pt will report >75% improvement in current functional status for improved quality of life.  8 Improve NDI and Oswestry to <20% indicating improved function and decreased pain.   Working Toward the Above Goals: (copied from last visit):    EXAMINATION:  Subjective Report: Pt reports that her neck felt a little better after last tx session.  Her back is really stiff today.    Pain:    6 neck and 4 back on a scale of 0 - 10    Objective Findings today: Palpation:  +TTP bil upper cervial and cervial erector spinae.    INTERVENTION:  Treatment Performed:   MANUAL THERAPY: Extensive STM/DTM to upper cervical, cervical erector spinae, bil UTs.  Gentle manual cervical traction to decrease muscle guarding, decrease pain.    THERAPEUTIC ACTIVITIES : educated pt on proper sleeping posture, exercises that she needs to do frequently (stretching) to restore  proper ROM in neck and back.  THERAPEUTIC EXERCISES : 1:1 per exercise flow sheet to improve flexibility, ROM, strength, stability, balance, functional mobility.  Practiced all HEP routine.    Exercise Specifics Date 10/31 Date Date Date Date Date   LTR  x10        SKC 10" hold x4        Pelvic tilt  x10                                                                                          Patient Education:   Patient was educated on updated HEP.    Patient did verbalize and demonstrate an understanding of this education.   Home Exercise Program was reviewed today., was updated today.[name]    EVALUATION AND DIAGNOSIS:   Leslie Gross is a 66 y.o. female presents to physical therapy today with dx  for neck pain and s/p L2 fracture.  Pt w/ mod difficulty w/ relaxing during manual therapy and PROM--she tends to push hard and tightens up.  Pt needs to work on her posture and stretch throughout the day for best outcomes w/ neck pain and stiffness.    Impairments in body function and structure:  Decreased PROM  Decreased AROM  Decreased Strength   Pain  Postural Dysfunction    Activity Limitations / Restricted Participation:  Can't get a good night's rest due to neck and back pain   Not able to look over shoulder while driving due to neck pain and tightness.   Pressure from neck goes up to top of head.   Reading/looking at phone causes the neck stiffness.   HA with neck stiffness--everyday.   Rt side of neck/face feel swollen/stiff.   Sit >1 hr and not able to standing >10 min causes back pain.   Neck pain w/ carrying grocery bags.   Can't walk >20 min max due to back pain.       PLAN:   Patient requires continued skilled intervention in order to decrease pain. and meet above mentioned functional goals.  Focus on therex, manual therapy for neck, modalities (PRN) and measure cervical ROM next visit.    Therapist Signature:    Leslie Gross, PT, MPT [phone]  Outpatient Speciality Rehab  Physical Medicine and Rehabilitation  [facility]  P: [phone]     08/15/2017

## 2017-08-14 ENCOUNTER — Ambulatory Visit (INDEPENDENT_AMBULATORY_CARE_PROVIDER_SITE_OTHER): Payer: Self-pay | Admitting: Physician Assistant

## 2017-08-14 DIAGNOSIS — I10 Essential (primary) hypertension: Secondary | ICD-10-CM

## 2017-08-14 HISTORY — DX: Essential (primary) hypertension: I10

## 2017-08-20 ENCOUNTER — Ambulatory Visit: Payer: Medicare Other | Attending: Nurse Practitioner

## 2017-08-20 DIAGNOSIS — G8929 Other chronic pain: Secondary | ICD-10-CM | POA: Insufficient documentation

## 2017-08-20 DIAGNOSIS — G4486 Cervicogenic headache: Secondary | ICD-10-CM

## 2017-08-20 DIAGNOSIS — M542 Cervicalgia: Secondary | ICD-10-CM | POA: Insufficient documentation

## 2017-08-20 DIAGNOSIS — R51 Headache: Secondary | ICD-10-CM | POA: Insufficient documentation

## 2017-08-20 DIAGNOSIS — M6281 Muscle weakness (generalized): Secondary | ICD-10-CM | POA: Insufficient documentation

## 2017-08-20 DIAGNOSIS — M545 Low back pain: Secondary | ICD-10-CM | POA: Insufficient documentation

## 2017-08-20 DIAGNOSIS — S32020A Wedge compression fracture of second lumbar vertebra, initial encounter for closed fracture: Secondary | ICD-10-CM

## 2017-08-20 NOTE — Progress Notes (Signed)
[facility]  [address], [address]  [place], [place]  [address]  Phone:  [phone]  Fax:  [phone]    PHYSICAL THERAPY DAILY TREATMENT NOTE    PATIENT: Leslie Gross DOB: 06-01-1951   MR #: [id number]  AGE: 66 y.o.    FACILITY PROVIDER #: [id number] PRIMARY MD: Lana Fish, MD    HICN# Medicare Sub. Num: [id number] DIAGNOSES: Neck pain [M54.2]      Date of Service PT Received On: 08/20/17   Treatment Time Start Time: 1304 to Stop Time: 1410   Time Calculation Time Calculation (min): 66 min   Visit # PT Visit  PT Visit Number: 3/35   Units Billed   Therapeutic Interventions  $ PT Therapeutic Exercise (97110): 1 Unit  $ PT Manual Therapy (97140): 2 Unit  $ PT Therapeutic Activity (97530): 1 unit     [name] referred for physical therapy services by: Sabino Snipes, NP    Certification period, precautions, medications and allergies and goals copied from initial evaluation - reviewed and reconciled today.  [name], PT 08/13/2017    Certification Period:  08/11/17 to 11/03/17    Treatment Diagnosis:  Neck Pain [M54.2], Painful Cervical ROM [M54.2], Chronic Lower Back Pain without Radiculopathy  [M54.5, G89.29, Generalized Muscle Weakness [M62.81] and cervicogenic headache R51    Date of onset: 05/30/17    Imaging Performed:  See imaging in Epic.    Precautions:   Fall Risk  Orthopedic Injury:  ankle fracture  Osteoporosis    Goals:  Short Term Goals:  To be met by 8 visits  1 Increase active cervical rotation to >50 degrees for increased visual field to progress pt to be safe with changing lanes and backing up vehicle while driving.  Progressing 11/7  2 Pt. will report >50% improvement in ability to sleep through night without report of interruptions due to pain.  3 Pt. will report decrease in frequency of headaches to less than 5 times per week.  4 Improve NDI and Oswestry to  <35%  indicating improved fucntion and decreased  pain.    Long Term Goals:  To be met by 24 visits  4 Increase active cervical rotation to >60 degrees for increased visual field to allow safe changing lanes and backing up vehicle while driving w/ no > 0/27 neck pain.  5 Pt. will report ability to sleep through night without report of interruptions due to pain.  6 Increase strength of bilateral hips to at least 4/5  to improve patients ability to walk >20 minutes for grocery shopping, walking dog.  7  Pt will report >75% improvement in current functional status for improved quality of life.  8 Improve NDI and Oswestry to <20% indicating improved function and decreased pain.   Working Toward the Above Goals: (copied from last visit):    EXAMINATION:  Subjective Report: Pt reports that her neck was really stiff this past weekend.  The back is also bothering her this past couple of days--and thinks it is from sleeping wrong.  Pt reports that she has a hard time relaxing while in bed--constantly fidgeting.    Pain:    4 neck and 3-4 back on a scale of 0 - 10    Objective Findings today: Palpation:  +TTP bil upper cervial and cervial erector spinae.    INTERVENTION:  Treatment Performed:   MANUAL THERAPY: Extensive STM/DTM to upper cervical, cervical erector spinae, bil  UTs.  Gentle manual cervical traction to decrease muscle guarding, decrease pain.    THERAPEUTIC ACTIVITIES : educated pt on proper sleeping posture, exercises that she needs to do frequently (stretching) to restore proper ROM in neck and back.  THERAPEUTIC EXERCISES : 1:1 per exercise flow sheet to improve flexibility, ROM, strength, stability, balance, functional mobility.  Practiced all HEP routine.    Exercise Specifics Date 10/31 Date 08/20/17 Date Date Date Date   LTR  x10 x10       SKC 10" hold x4 -       Pelvic tilt  x10 x10       Cervical retraction supine - 5 sec x10-2       C rot'n Passive - 10 sec x5       C Side Bend passive - 10 sec x5                                                            Patient Education:   Patient was educated on importance of good posture, supported sleeping, using MH on neck and stretching.    Patient did verbalize and demonstrate an understanding of this education.   Home Exercise Program was reviewed today.    EVALUATION AND DIAGNOSIS:   Leslie Gross is a 66 y.o. female presents to physical therapy today with dx for neck pain and s/p L2 fracture.    Pt w/ mod difficulty w/ relaxing during manual therapy and PROM--she tends to push hard and tightens up.  Good challenge w/ therex today--mod difficulty to perform exercises as pt has difficulty following direction.  Pt w/ increased pressure over forehead and face w/ occipital release but the neck feels good.  Pt presents w/ upper cervical tightness which hasn't resolved much but lower cervical tightness is resolving.    Impairments in body function and structure:  Decreased PROM  Decreased AROM  Decreased Strength   Pain  Postural Dysfunction    Activity Limitations / Restricted Participation:  Can't get a good night's rest due to neck and back pain   Not able to look over shoulder while driving due to neck pain and tightness.   Pressure from neck goes up to top of head.   Reading/looking at phone causes the neck stiffness.   HA with neck stiffness--everyday.   Rt side of neck/face feel swollen/stiff.   Sit >1 hr and not able to standing >10 min causes back pain.   Neck pain w/ carrying grocery bags.   Can't walk >20 min max due to back pain.       PLAN:   Patient requires continued skilled intervention in order to decrease pain. and meet above mentioned functional goals.  Focus on therex, manual therapy for neck, modalities (PRN) and measure cervical ROM next visit.    Therapist Signature:    [name], PT, MPT [phone]  Outpatient Speciality Rehab  Physical Medicine and Rehabilitation  [facility]  P: [phone]     08/20/2017

## 2017-08-22 ENCOUNTER — Ambulatory Visit: Payer: Medicare Other

## 2017-08-22 DIAGNOSIS — M545 Low back pain, unspecified: Secondary | ICD-10-CM

## 2017-08-22 DIAGNOSIS — G8929 Other chronic pain: Secondary | ICD-10-CM

## 2017-08-22 DIAGNOSIS — M542 Cervicalgia: Secondary | ICD-10-CM

## 2017-08-22 DIAGNOSIS — S32020A Wedge compression fracture of second lumbar vertebra, initial encounter for closed fracture: Secondary | ICD-10-CM

## 2017-08-22 DIAGNOSIS — G4486 Cervicogenic headache: Secondary | ICD-10-CM

## 2017-08-22 NOTE — Progress Notes (Signed)
[facility]  [address]  [place], [place]  [address]  Phone:  [phone]  Fax:  [phone]    PHYSICAL THERAPY DAILY TREATMENT NOTE    PATIENT: Leslie Gross DOB: [birth year]   MR #: [id number]  AGE: 66 y.o.    FACILITY PROVIDER #: [id number] PRIMARY MD: Lana Fish, MD    HICN# Medicare Sub. Num: [id number] DIAGNOSES: Neck pain [M54.2]      Date of Service PT Received On: 08/22/17   Treatment Time Start Time: 1100 to Stop Time: 1200   Time Calculation Time Calculation (min): 60 min   Visit # PT Visit  PT Visit Number: 4/35   Units Billed   Therapeutic Interventions  $ PT Ultrasound (41660): 1 Units  $ PT Therapeutic Exercise (97110): 1 Unit  $ PT Manual Therapy (63016): 2 Unit     [name] referred for physical therapy services by: Sabino Snipes, NP    Certification period, precautions, medications and allergies and goals copied from initial evaluation - reviewed and reconciled today.  [name], PT 08/13/2017    Certification Period:  08/11/17 to 11/03/17    Treatment Diagnosis:  Neck Pain [M54.2], Painful Cervical ROM [M54.2], Chronic Lower Back Pain without Radiculopathy  [M54.5, G89.29, Generalized Muscle Weakness [M62.81] and cervicogenic headache R51    Date of onset: 05/30/17    Imaging Performed:  See imaging in Epic.    Precautions:   Fall Risk  Orthopedic Injury:  ankle fracture  Osteoporosis    Goals:  Short Term Goals:  To be met by 8 visits  1 Increase active cervical rotation to >50 degrees for increased visual field to progress pt to be safe with changing lanes and backing up vehicle while driving.  Progressing 11/7  2 Pt. will report >50% improvement in ability to sleep through night without report of interruptions due to pain.  3 Pt. will report decrease in frequency of headaches to less than 5 times per week.  4 Improve NDI and Oswestry to  <35%  indicating improved fucntion and decreased pain.    Long  Term Goals:  To be met by 24 visits  4 Increase active cervical rotation to >60 degrees for increased visual field to allow safe changing lanes and backing up vehicle while driving w/ no > 0/10 neck pain.  5 Pt. will report ability to sleep through night without report of interruptions due to pain.  6 Increase strength of bilateral hips to at least 4/5  to improve patients ability to walk >20 minutes for grocery shopping, walking dog.  7  Pt will report >75% improvement in current functional status for improved quality of life.  8 Improve NDI and Oswestry to <20% indicating improved function and decreased pain.   Working Toward the Above Goals: (copied from last visit):    EXAMINATION:  Subjective Report: Pt reports that her neck feels better right after therapy.  She has been using the tennis balls and neck feels good while she is using the ball.  Pain:    4 neck and 3-4 back on a scale of 0 - 10    Objective Findings today: Palpation:  +TTP bil upper cervial and cervial erector spinae Rt > Lt.  Rt occiput.  AROM: flxn 46 deg, SB Lt 23 deg, Rt 28 deg, Rot'n Lt 38 deg, Rt 45 deg.    INTERVENTION:  Treatment Performed:   MODALITIES:  [place] 1  w/cm2 1 MHz for 8 min Rt upper cervical and Rt cervical erector spinae to decrease pain, stiffness and promote healing.  MANUAL THERAPY: Extensive STM/DTM to upper cervical, cervical erector spinae, bil UTs.  Gentle manual cervical traction to decrease muscle guarding, decrease pain.    THERAPEUTIC EXERCISES : 1:1 per exercise flow sheet to improve flexibility, ROM, strength, stability, balance, functional mobility.    Exercise Specifics Date 10/31 Date 08/20/17 Date 11/9 Date Date Date   LTR  x10 x10 -      SKC 10" hold x4 -       Pelvic tilt  x10 x10       Cervical retraction supine - 5 sec x10-2 5 sec x10  Seated 5" x5      C rot'n Passive - 10 sec x5 activ rot'n +nods x5-2 Rt/Lt      C Side Bend passive - 10 sec x5 Lateral neck stretch 10" x4 Rt/Lt      upper trap stretch seated  - - 10" x2 Rt/Lt      shldr rolls  - - x5                                      Patient Education:   Patient was educated on importance of good posture, supported sleeping, using MH on neck and stretching.  Updated HEP routine.  Patient did verbalize and demonstrate an understanding of this education.   Home Exercise Program was updated today.    EVALUATION AND DIAGNOSIS:   Leslie Gross is a 66 y.o. female presents to physical therapy today with dx for neck pain and s/p L2 fracture.    Pt w/ mod difficulty w/ relaxing during manual therapy and PROM--she tends to push hard and tightens up.  Continues to have difficulty w/ supine exercises but able to do the updated HEP better..  Pt w/ no increase in head pressure or headache after today's tx session.  Pt presents w/ upper cervical tightness which hasn't resolved much but lower cervical tightness is resolving.    Impairments in body function and structure:  Decreased PROM  Decreased AROM  Decreased Strength   Pain  Postural Dysfunction    Activity Limitations / Restricted Participation:  Can't get a good night's rest due to neck and back pain   Not able to look over shoulder while driving due to neck pain and tightness.   Pressure from neck goes up to top of head.   Reading/looking at phone causes the neck stiffness.   HA with neck stiffness--everyday.   Rt side of neck/face feel swollen/stiff.   Sit >1 hr and not able to standing >10 min causes back pain.   Neck pain w/ carrying grocery bags.   Can't walk >20 min max due to back pain.       PLAN:   Patient requires continued skilled intervention in order to decrease pain. and meet above mentioned functional goals.  Focus on therex, manual therapy for neck, start postural exercises (for scapula).   Therapist Signature:    [name], PT, MPT [phone]  Outpatient Speciality Rehab  Physical Medicine and Rehabilitation  [facility]  P: [phone]     08/22/2017

## 2017-08-25 ENCOUNTER — Ambulatory Visit
Admission: RE | Admit: 2017-08-25 | Discharge: 2017-08-25 | Disposition: A | Discharge status: Home / Self Care | Payer: Medicare Other | Source: Ambulatory Visit | Attending: Physician Assistant | Admitting: Physician Assistant

## 2017-08-25 ENCOUNTER — Ambulatory Visit: Payer: Medicare Other

## 2017-08-25 DIAGNOSIS — M542 Cervicalgia: Secondary | ICD-10-CM

## 2017-08-25 DIAGNOSIS — G4486 Cervicogenic headache: Secondary | ICD-10-CM

## 2017-08-25 DIAGNOSIS — G8929 Other chronic pain: Secondary | ICD-10-CM

## 2017-08-25 DIAGNOSIS — I340 Nonrheumatic mitral (valve) insufficiency: Secondary | ICD-10-CM | POA: Insufficient documentation

## 2017-08-25 LAB — BASIC METABOLIC PANEL
Anion Gap: 3 — ABNORMAL LOW (ref 5.0–15.0)
BUN: 14.2 mg/dL (ref 7.0–19.0)
CO2: 30 mEq/L — ABNORMAL HIGH (ref 22–29)
Calcium: 9.1 mg/dL (ref 8.5–10.5)
Chloride: 109 mEq/L (ref 100–111)
Creatinine: 0.8 mg/dL (ref 0.6–1.0)
Glucose: 90 mg/dL (ref 70–100)
Potassium: 5 mEq/L (ref 3.5–5.1)
Sodium: 142 mEq/L (ref 136–145)

## 2017-08-25 LAB — GFR: EGFR: >60

## 2017-08-25 NOTE — Progress Notes (Signed)
[facility]  [address]  [place], [place]  [address]  Phone:  [phone]  Fax:  [phone]    PHYSICAL THERAPY DAILY TREATMENT NOTE    PATIENT: Leslie Gross DOB: [birth year]   MR #: [id number]  AGE: 66 y.o.    FACILITY PROVIDER #: [id number] PRIMARY MD: Lana Fish, MD    HICN# Medicare Sub. Num: [id number] DIAGNOSES: Neck pain [M54.2]      Date of Service PT Received On: 08/25/17   Treatment Time Start Time: 1302 to Stop Time: 1410   Time Calculation Time Calculation (min): 68 min   Visit # PT Visit  PT Visit Number: 5/35   Units Billed   Therapeutic Interventions  $ PT Ultrasound (02725): 1 Units  $ PT Therapeutic Exercise (97110): 1 Unit  $ PT Manual Therapy (36644): 2 Unit     [name] referred for physical therapy services by: Sabino Snipes, NP    Certification period, precautions, medications and allergies and goals copied from initial evaluation - reviewed and reconciled today.  [name], PT 08/13/2017    Certification Period:  08/11/17 to 11/03/17    Treatment Diagnosis:  Neck Pain [M54.2], Painful Cervical ROM [M54.2], Chronic Lower Back Pain without Radiculopathy  [M54.5, G89.29, Generalized Muscle Weakness [M62.81] and cervicogenic headache R51    Date of onset: 05/30/17    Imaging Performed:  See imaging in Epic.    Precautions:   Fall Risk  Orthopedic Injury:  ankle fracture  Osteoporosis    Goals:  Short Term Goals:  To be met by 8 visits  1 Increase active cervical rotation to >50 degrees for increased visual field to progress pt to be safe with changing lanes and backing up vehicle while driving.  Progressing 11/7  2 Pt. will report >50% improvement in ability to sleep through night without report of interruptions due to pain.  3 Pt. will report decrease in frequency of headaches to less than 5 times per week.  4 Improve NDI and Oswestry to  <35%  indicating improved fucntion and decreased pain.    Long  Term Goals:  To be met by 24 visits  4 Increase active cervical rotation to >60 degrees for increased visual field to allow safe changing lanes and backing up vehicle while driving w/ no > 0/34 neck pain.  5 Pt. will report ability to sleep through night without report of interruptions due to pain.  6 Increase strength of bilateral hips to at least 4/5  to improve patients ability to walk >20 minutes for grocery shopping, walking dog.  7  Pt will report >75% improvement in current functional status for improved quality of life.  8 Improve NDI and Oswestry to <20% indicating improved function and decreased pain.   Working Toward the Above Goals: (copied from last visit):    EXAMINATION:  Subjective Report: Pt reports that her neck was really bad yesterday.  Doesn't know if it is from exercising too much on Saturday.  She was very busy on Saturday.  Pt reported that she too flexeril on Saturday evening and on Sunday along w/ Tylenol during day to alleviate pain..    Pain:    4 neck and 3-4 back on a scale of 0 - 10; 2/10 neck pain post tx.    Objective Findings today: Palpation:  +TTP bil upper cervial and cervial erector spinae Rt > Lt.  Rt occiput.    INTERVENTION:  Treatment Performed:   MODALITIES:  [place] 1 w/cm2 1 MHz for 8 min Rt proximal upper cervical and Rt cervical erector spinae to decrease pain, stiffness and promote healing.  MH to neck 10' post tx.  MANUAL THERAPY: Extensive STM/DTM to upper cervical, cervical erector spinae, bil UTs.  Gentle manual cervical traction to decrease muscle guarding, decrease pain.   Flexion diagonal  THERAPEUTIC EXERCISES : 1:1 per exercise flow sheet to improve flexibility, ROM, strength, stability, balance, functional mobility.    Exercise Specifics Date 10/31 Date 08/20/17 Date 11/9 Date 11/12 Date Date   LTR  x10 x10 -      SKC 10" hold x4 -       Pelvic tilt  x10 x10       Cervical retraction supine - 5 sec x10-2 5 sec x10  Seated 5" x5 5 sec x10 -2       C rot'n Passive -  10 sec x5 activ rot'n +nods x5-2 Rt/Lt x10 each     C Side Bend passive - 10 sec x5 Lateral neck stretch 10" x4 Rt/Lt 10" x2 Rt/Lt     upper trap stretch seated - - 10" x2 Rt/Lt 10"x2 R/Lt     shldr rolls  - - x5 x5                                     Patient Education:   Patient was educated on importance of good posture, supported sleeping, using MH on neck and stretching.    Patient did verbalize and demonstrate an understanding of this education.   Home Exercise Program was reviewed today.    EVALUATION AND DIAGNOSIS:   Leslie Gross is a 66 y.o. female presents to physical therapy today with dx for neck pain and s/p L2 fracture.    Pt w/ mod difficulty w/ relaxing during manual therapy and PROM--she tends to push hard and tightens up.  Pt continues to have difficulty w/ supine exercises but able to do them better.  Pt w/ no increase in head pressure or headache after today's tx session.  Pt presents w/ upper cervical tightness which hasn't resolved much but lower cervical tightness is resolving.  Increased Lt rot'n post tx but no signficant improvement to Rt rot'n (active).  Pt has full passive cervical rot'n.    Impairments in body function and structure:  Decreased PROM  Decreased AROM  Decreased Strength   Pain  Postural Dysfunction    Activity Limitations / Restricted Participation:  Can't get a good night's rest due to neck and back pain   Not able to look over shoulder while driving due to neck pain and tightness.   Pressure from neck goes up to top of head.   Reading/looking at phone causes the neck stiffness.   HA with neck stiffness--everyday.   Rt side of neck/face feel swollen/stiff.   Sit >1 hr and not able to standing >10 min causes back pain.   Neck pain w/ carrying grocery bags.   Can't walk >20 min max due to back pain.       PLAN:   Patient requires continued skilled intervention in order to decrease pain. and meet above mentioned functional goals.  Focus on therex, manual therapy for  neck, start postural exercises (for scapula).   Assess STGs and LTGs.    Therapist Signature:    [name], PT, MPT [phone]  Outpatient Speciality Rehab  Physical Medicine and Rehabilitation  [facility]  P: [phone]     08/25/2017

## 2017-08-27 ENCOUNTER — Ambulatory Visit: Payer: Medicare Other

## 2017-08-27 DIAGNOSIS — G8929 Other chronic pain: Secondary | ICD-10-CM

## 2017-08-27 DIAGNOSIS — G4486 Cervicogenic headache: Secondary | ICD-10-CM

## 2017-08-27 DIAGNOSIS — S32020A Wedge compression fracture of second lumbar vertebra, initial encounter for closed fracture: Secondary | ICD-10-CM

## 2017-08-27 DIAGNOSIS — M542 Cervicalgia: Secondary | ICD-10-CM

## 2017-08-27 NOTE — Progress Notes (Signed)
[facility]  [address], [address]  [place], [place]  [address]  Phone:  [phone]  Fax:  [phone]    PHYSICAL THERAPY DAILY TREATMENT NOTE    PATIENT: Leslie Gross DOB: [birth year]   MR #: [id number]  AGE: 66 y.o.    FACILITY PROVIDER #: [id number] PRIMARY MD: Lana Fish, MD    HICN# Medicare Sub. Num: [id number] DIAGNOSES: Neck pain [M54.2]      Date of Service PT Received On: 08/27/17   Treatment Time Start Time: 1304 to Stop Time: 1410   Time Calculation Time Calculation (min): 66 min   Visit # PT Visit  PT Visit Number: 6/35   Units Billed   Therapeutic Interventions  $ PT Therapeutic Exercise (97110): 2 Units  $ PT Manual Therapy (02725): 2 Unit     [name] referred for physical therapy services by: Sabino Snipes, NP    Certification period, precautions, medications and allergies and goals copied from initial evaluation - reviewed and reconciled today.  [name], PT 08/13/2017    Certification Period:  08/11/17 to 11/03/17    Treatment Diagnosis:  Neck Pain [M54.2], Painful Cervical ROM [M54.2], Chronic Lower Back Pain without Radiculopathy  [M54.5, G89.29, Generalized Muscle Weakness [M62.81] and cervicogenic headache R51    Date of onset: 05/30/17    Imaging Performed:  See imaging in Epic.    Precautions:   Fall Risk  Orthopedic Injury:  ankle fracture  Osteoporosis    Goals:  Short Term Goals:  To be met by 8 visits  1 Increase active cervical rotation to >50 degrees for increased visual field to progress pt to be safe with changing lanes and backing up vehicle while driving.  Progressing 11/7  2 Pt. will report >50% improvement in ability to sleep through night without report of interruptions due to pain.  3 Pt. will report decrease in frequency of headaches to less than 5 times per week.  4 Improve NDI and Oswestry to  <35%  indicating improved fucntion and decreased pain.    Long Term Goals:  To be met by 24  visits  4 Increase active cervical rotation to >60 degrees for increased visual field to allow safe changing lanes and backing up vehicle while driving w/ no > 3/66 neck pain.  5 Pt. will report ability to sleep through night without report of interruptions due to pain.  6 Increase strength of bilateral hips to at least 4/5  to improve patients ability to walk >20 minutes for grocery shopping, walking dog.  7  Pt will report >75% improvement in current functional status for improved quality of life.  8 Improve NDI and Oswestry to <20% indicating improved function and decreased pain.   Working Toward the Above Goals: (copied from last visit):    EXAMINATION:  Subjective Report: Pt reports that her neck felt better since last tx session.  Pain:    2 neck at rest, w/ movement it increases to 3-4/10 and 1-2 back ache at rest.    Objective Findings today: Palpation:  +TTP bil upper cervial and cervial erector spinae Rt > Lt.  Rt occiput.    INTERVENTION:  Treatment Performed:   MODALITIES:  MH to neck 10' post tx.  MANUAL THERAPY: Extensive STM/DTM to upper cervical, cervical erector spinae, bil UTs.  Gentle manual cervical traction to decrease muscle guarding, decrease pain.   Flexion diagonal stretches; upper cervial rot'n mobs grade I-II.  THERAPEUTIC EXERCISES : 1:1 per exercise flow sheet to improve flexibility, ROM, strength, stability, balance, functional mobility.    Exercise Specifics Date 10/31 Date 08/20/17 Date 11/9 Date 11/12 Date 11/14 Date   LTR  x10 x10 -  UBE: fwd 3'    SKC 10" hold x4 -   Rows: YTB x10-2    Pelvic tilt  x10 x10   bil shldr ext: YTB x10-2    Cervical retraction supine - 5 sec x10-2 5 sec x10  Seated 5" x5 5 sec x10 -2   5 sec x10-2    C rot'n Passive - 10 sec x5 activ rot'n +nods x5-2 Rt/Lt x10 each x10 each    C Side Bend passive - 10 sec x5 Lateral neck stretch 10" x4 Rt/Lt 10" x2 Rt/Lt 10" x2 R/Lt    upper trap stretch seated - - 10" x2 Rt/Lt 10"x2 R/Lt Cervical flxn 10" x2    shldr  rolls  - - x5 x5 scap add x10    Lateral neck stretch 10" hold - - - - x2    rot'n stretch 10" hold     x2    Lat pull downs resisted - - - - YTB x10-2    Horiz abd resisted - - - - YTB x10-2      Patient Education:   Patient was educated on importance of good posture, supported sleeping, using MH on neck and stretching.    Patient did verbalize and demonstrate an understanding of this education.   Home Exercise Program was reviewed today.    EVALUATION AND DIAGNOSIS:   Leslie Gross is a 66 y.o. female presents to physical therapy today with dx for neck pain and s/p L2 fracture.    Pt w/ mod difficulty w/ relaxing during manual therapy and PROM--she tends to push hard and tightens up.  Pt able to do supine exercises w/o any difficulties today.  Pt w/ no increase in head pressure or headache after today's tx session.  Pt presents w/ minimal decrease in upper cervical tightness and mod decrease in lower cervical tightness is resolving.  Increased bil passive rot'n post tx.      Impairments in body function and structure:  Decreased PROM  Decreased AROM  Decreased Strength   Pain  Postural Dysfunction    Activity Limitations / Restricted Participation:  Can't get a good night's rest due to neck and back pain.  Progressing 08/27/17   Not able to look over shoulder while driving due to neck pain and tightness.  Progressing 08/27/17   Pressure from neck goes up to top of head.  Progressing 08/27/17   Reading/looking at phone causes the neck stiffness.   HA with neck stiffness--everyday.   Rt side of neck/face feel swollen/stiff.   Sit >1 hr and not able to standing >10 min causes back pain.   Neck pain w/ carrying grocery bags.   Can't walk >20 min max due to back pain.       PLAN:   Patient requires continued skilled intervention in order to decrease pain. and meet above mentioned functional goals.  Focus on therex, manual therapy for neck, start postural exercises (for scapula).   Measure ROM, update goal and  functional status.    Therapist Signature:    [name], PT, MPT [phone]  Outpatient Speciality Rehab  Physical Medicine and Rehabilitation  [facility]  P: [phone]     08/27/2017

## 2017-09-01 ENCOUNTER — Ambulatory Visit: Payer: Medicare Other

## 2017-09-03 ENCOUNTER — Ambulatory Visit: Payer: Medicare Other

## 2017-09-08 ENCOUNTER — Ambulatory Visit: Payer: Medicare Other

## 2017-09-10 ENCOUNTER — Ambulatory Visit: Payer: Medicare Other

## 2017-09-15 ENCOUNTER — Ambulatory Visit: Payer: Medicare Other | Attending: Nurse Practitioner

## 2017-09-15 DIAGNOSIS — R51 Headache: Secondary | ICD-10-CM | POA: Insufficient documentation

## 2017-09-15 DIAGNOSIS — M6281 Muscle weakness (generalized): Secondary | ICD-10-CM | POA: Insufficient documentation

## 2017-09-15 DIAGNOSIS — G8929 Other chronic pain: Secondary | ICD-10-CM | POA: Insufficient documentation

## 2017-09-15 DIAGNOSIS — M545 Low back pain, unspecified: Secondary | ICD-10-CM

## 2017-09-15 DIAGNOSIS — G4486 Cervicogenic headache: Secondary | ICD-10-CM

## 2017-09-15 DIAGNOSIS — M542 Cervicalgia: Secondary | ICD-10-CM | POA: Insufficient documentation

## 2017-09-15 NOTE — Progress Notes (Signed)
[facility]  [address]  [place], [place]  [address]  Phone:  [phone]  Fax:  [phone]    PHYSICAL THERAPY DAILY TREATMENT NOTE    PATIENT: Leslie Gross DOB: Jun 12, 1951   MR #: [id number]  AGE: 66 y.o.    FACILITY PROVIDER #: [id number] PRIMARY MD: Lana Fish, MD    HICN# Medicare Sub. Num: [id number] DIAGNOSES: Neck pain [M54.2]      Date of Service PT Received On: 09/15/17   Treatment Time Start Time: 1304 to Stop Time: 1410   Time Calculation Time Calculation (min): 66 min   Visit # PT Visit  PT Visit Number: 7/35   Units Billed   Therapeutic Interventions  $ PT Therapeutic Exercise (97110): 2 Units  $ PT Manual Therapy (18841): 2 Unit     [name] referred for physical therapy services by: Sabino Snipes, NP    Certification period, precautions, medications and allergies and goals copied from initial evaluation - reviewed and reconciled today.  [name], PT 08/13/2017    Certification Period:  08/11/17 to 11/03/17    Treatment Diagnosis:  Neck Pain [M54.2], Painful Cervical ROM [M54.2], Chronic Lower Back Pain without Radiculopathy  [M54.5, G89.29, Generalized Muscle Weakness [M62.81] and cervicogenic headache R51    Date of onset: 05/30/17    Imaging Performed:  See imaging in Epic.    Precautions:   Fall Risk  Orthopedic Injury:  ankle fracture  Osteoporosis    Goals:  Short Term Goals:  To be met by 8 visits  1 Increase active cervical rotation to >50 degrees for increased visual field to progress pt to be safe with changing lanes and backing up vehicle while driving.  Progressing 11/7  2 Pt. will report >50% improvement in ability to sleep through night without report of interruptions due to pain.  3 Pt. will report decrease in frequency of headaches to less than 5 times per week.  4 Improve NDI and Oswestry to  <35%  indicating improved fucntion and decreased pain.    Long Term Goals:  To be met by 24  visits  4 Increase active cervical rotation to >60 degrees for increased visual field to allow safe changing lanes and backing up vehicle while driving w/ no > 6/60 neck pain.  5 Pt. will report ability to sleep through night without report of interruptions due to pain.  6 Increase strength of bilateral hips to at least 4/5  to improve patients ability to walk >20 minutes for grocery shopping, walking dog.  7  Pt will report >75% improvement in current functional status for improved quality of life.  8 Improve NDI and Oswestry to <20% indicating improved function and decreased pain.   Working Toward the Above Goals: (copied from last visit):    EXAMINATION:  Subjective Report: Pt reports that her neck pain has decreased but still can't move it much.  She has been doing the cervical retraction a lot and it helps her.  Pt reports that while she was away in [place] she woke up w/ HA almost everyday.  Today pt woke up w/ HA and had to take 4 Excedrin.    Pain:    0 neck at rest, w/ movement it increases to 2-3/10 and 1-2 back ache at rest.    Objective Findings today: Palpation:  +TTP bil upper cervial and mid erector spinae Rt > Lt.  Rt occiput, bil upper thoracic  erector spinae Rt >Lt and bil UTs Rt > Lt.    INTERVENTION:  Treatment Performed:   MODALITIES:  MH to neck 10' post tx.  MANUAL THERAPY: Extensive STM/DTM to upper cervical, cervical erector spinae, bil UTs.  Gentle manual cervical traction to decrease muscle guarding, decrease pain.   Flexion diagonal stretches; upper cervial rot'n mobs grade I-II.  CRC for increasing rot'n.    THERAPEUTIC EXERCISES : 1:1 per exercise flow sheet to improve flexibility, ROM, strength, stability, balance, functional mobility.    Exercise Specifics Date 10/31 Date 08/20/17 Date 11/9 Date 11/12 Date 11/14 Date   LTR  x10 x10 -  UBE: fwd 3' UBE: fwd 3'50"   SKC 10" hold x4 -   Rows: YTB x10-2 YTB x10-2   Pelvic tilt  x10 x10   bil shldr ext: YTB x10-2 YTB x10-2   Cervical retraction  supine - 5 sec x10-2 5 sec x10  Seated 5" x5 5 sec x10 -2   5 sec x10-2 5 sec x10-2   C rot'n Passive - 10 sec x5 activ rot'n +nods x5-2 Rt/Lt x10 each x10 each -   C Side Bend passive - 10 sec x5 Lateral neck stretch 10" x4 Rt/Lt 10" x2 Rt/Lt 10" x2 R/Lt 10" x2 r/tL   upper trap stretch seated - - 10" x2 Rt/Lt 10"x2 R/Lt Cervical flxn 10" x2 10" x3   shldr rolls  - - x5 x5 scap add x10 -   Lateral neck stretch 10" hold - - - - x2 x3 bil   rot'n stretch 10" hold     x2    Lat pull downs resisted - - - - YTB x10-2 YTB x10-2   Horiz abd resisted - - - - YTB x10-2 YTB x10-2     Patient Education:   Patient was educated on importance of good posture, using MH on neck and stretching.    Patient did verbalize and demonstrate an understanding of this education.   Home Exercise Program was reviewed today.    EVALUATION AND DIAGNOSIS:   Leslie Gross is a 66 y.o. female presents to physical therapy today with dx for neck pain and s/p L2 fracture.    Pt w/ mod difficulty w/ relaxing during manual therapy and PROM--she tends to push hard and tightens up.  Pt able to do supine exercises w/o any difficulties or increase in neck pain today.  Pt presents w/ mod decrease in upper cervical tightness and lower cervical tightness.  Increased (equal) bil passive rot'n post tx but not translated into sitting, Rt rot'n only 50% whereas Lt rot'n increased up to 75%.  Increased neck tightness may be due to pt's hectic schedule while she is out for 2 weeks.  Did not measure ROM today as pt is more sore and stiff today.    Impairments in body function and structure:  Decreased PROM  Decreased AROM  Decreased Strength   Pain  Postural Dysfunction    Activity Limitations / Restricted Participation:  Can't get a good night's rest due to neck and back pain.  Progressing 08/27/17   Not able to look over shoulder while driving due to neck pain and tightness.  Progressing 08/27/17   Pressure from neck goes up to top of head.  Progressing  08/27/17   Reading/looking at phone causes the neck stiffness.   HA with neck stiffness--everyday.   Rt side of neck/face feel swollen/stiff.  Resolved 09/15/17   Sit >1 hr and not  able to standing >10 min causes back pain.   Neck pain w/ carrying grocery bags.   Can't walk >20 min max due to back pain.       PLAN:   Patient requires continued skilled intervention in order to decrease pain. and meet above mentioned functional goals.  Focus on therex, manual therapy for neck, start postural exercises (for scapula).   Measure ROM, update goal and functional status.    Therapist Signature:    [name], PT, MPT [phone]  Outpatient Speciality Rehab  Physical Medicine and Rehabilitation  [facility]  P: [phone]     09/15/2017

## 2017-09-16 ENCOUNTER — Ambulatory Visit (INDEPENDENT_AMBULATORY_CARE_PROVIDER_SITE_OTHER): Payer: Self-pay | Admitting: Physician Assistant

## 2017-09-17 ENCOUNTER — Ambulatory Visit: Payer: Medicare Other

## 2017-09-17 DIAGNOSIS — G4486 Cervicogenic headache: Secondary | ICD-10-CM

## 2017-09-17 DIAGNOSIS — S32020A Wedge compression fracture of second lumbar vertebra, initial encounter for closed fracture: Secondary | ICD-10-CM

## 2017-09-17 DIAGNOSIS — M545 Low back pain, unspecified: Secondary | ICD-10-CM

## 2017-09-17 DIAGNOSIS — M542 Cervicalgia: Secondary | ICD-10-CM

## 2017-09-17 DIAGNOSIS — G8929 Other chronic pain: Secondary | ICD-10-CM

## 2017-09-17 NOTE — Progress Notes (Signed)
[facility]  [address], [address]  [place], [place]  [address]  Phone:  [phone]  Fax:  [phone]    PHYSICAL THERAPY DAILY TREATMENT NOTE    PATIENT: Leslie Gross DOB: [birth year]   MR #: [id number]  AGE: 66 y.o.    FACILITY PROVIDER #: [id number] PRIMARY MD: Lana Fish, MD    HICN# Medicare Sub. Num: [id number] DIAGNOSES: Neck pain [M54.2]      Date of Service PT Received On: 09/17/17   Treatment Time Start Time: 1302 to Stop Time: 1400   Time Calculation Time Calculation (min): 58 min   Visit # PT Visit  PT Visit Number: 8/35   Units Billed   Therapeutic Interventions  $ PT Therapeutic Exercise (97110): 2 Units  $ PT Manual Therapy (02725): 2 Unit     [name] referred for physical therapy services by: Sabino Snipes, NP    Certification period, precautions, medications and allergies and goals copied from initial evaluation - reviewed and reconciled today.  [name], PT 08/13/2017    Certification Period:  08/11/17 to 11/03/17    Treatment Diagnosis:  Neck Pain [M54.2], Painful Cervical ROM [M54.2], Chronic Lower Back Pain without Radiculopathy  [M54.5, G89.29, Generalized Muscle Weakness [M62.81] and cervicogenic headache R51    Date of onset: 05/30/17    Imaging Performed:  See imaging in Epic.    Precautions:   Fall Risk  Orthopedic Injury:  ankle fracture  Osteoporosis    Goals:  Short Term Goals:  To be met by 8 visits  1 Increase active cervical rotation to >50 degrees for increased visual field to progress pt to be safe with changing lanes and backing up vehicle while driving.  Progressing 11/7  2 Pt. will report >50% improvement in ability to sleep through night without report of interruptions due to pain.  Progressing 09/17/17  3 Pt. will report decrease in frequency of headaches to less than 5 times per week.  Progressing 09/17/17  4 Improve NDI and Oswestry to  <35%  indicating improved fucntion and decreased  pain.    Long Term Goals:  To be met by 24 visits  4 Increase active cervical rotation to >60 degrees for increased visual field to allow safe changing lanes and backing up vehicle while driving w/ no > 3/66 neck pain.  5 Pt. will report ability to sleep through night without report of interruptions due to pain.  6 Increase strength of bilateral hips to at least 4/5  to improve patients ability to walk >20 minutes for grocery shopping, walking dog.  7  Pt will report >75% improvement in current functional status for improved quality of life.  8 Improve NDI and Oswestry to <20% indicating improved function and decreased pain.   Working Toward the Above Goals: (copied from last visit):    EXAMINATION:  Subjective Report: Pt reports that she hasn't had a HA since her last PT visit.     Pain:    0 neck at rest, w/ movement it increases to 2-3/10 and 1-2 back ache at rest.    Objective Findings today: Palpation:  +TTP bil upper cervial and mid erector spinae Rt > Lt.  Rt occiput, bil upper thoracic erector spinae Rt >Lt and bil UTs Rt > Lt.  PROM: >75% rot'n bil in supine.  Upper cervical rot'n 50% bil.  AROM: Lt rot'n 60 deg, Rt rot'n 40 deg    INTERVENTION:  Treatment Performed:   MODALITIES:  MH to neck 10' post tx.  MANUAL THERAPY: Extensive STM/DTM to upper cervical, cervical erector spinae, bil UTs.  Gentle manual cervical traction to decrease muscle guarding, decrease pain.   Flexion diagonal stretches; upper cervial rot'n mobs grade I-II.  CRC for increasing rot'n.  THERAPEUTIC EXERCISES : 1:1 per exercise flow sheet to improve flexibility, ROM, strength, stability, balance, functional mobility.    Exercise Specifics Date 10/31 Date 08/20/17 Date 11/9 Date 11/12 Date 11/14 Date Date 12/5   LTR  x10 x10 -  UBE: fwd 3' UBE: fwd 3'50" Fwd 4'   SKC 10" hold x4 -   Rows: YTB x10-2 YTB x10-2 YTB x12-2   Pelvic tilt  x10 x10   bil shldr ext: YTB x10-2 YTB x10-2 YTB x12-2   Cervical retraction supine - 5 sec x10-2 5 sec  x10  Seated 5" x5 5 sec x10 -2   5 sec x10-2 5 sec x10-2 5 sec x10-2   C rot'n Passive - 10 sec x5 activ rot'n +nods x5-2 Rt/Lt x10 each x10 each - Upper cervical rot'n: x10   C Side Bend passive - 10 sec x5 Lateral neck stretch 10" x4 Rt/Lt 10" x2 Rt/Lt 10" x2 R/Lt 10" x2 r/tL 10" x2 r/tL   upper trap stretch seated - - 10" x2 Rt/Lt 10"x2 R/Lt Cervical flxn 10" x2 10" x3 10" x4   shldr rolls  - - x5 x5 scap add x10 - -   Lateral neck stretch 10" hold - - - - x2 x3 bil x3   rot'n stretch 10" hold     x2     Lat pull downs resisted - - - - YTB x10-2 YTB x10-2 x12-2   Horiz abd resisted - - - - YTB x10-2 YTB x10-2 x12-2     Patient Education:   Patient was educated on importance of good posture, using MH on neck and stretching.    Patient did verbalize and demonstrate an understanding of this education.   Home Exercise Program was reviewed today.    EVALUATION AND DIAGNOSIS:   Leslie Gross is a 66 y.o. female presents to physical therapy today with dx for neck pain and s/p L2 fracture.    Pt w/ mod difficulty w/ relaxing during manual therapy and PROM--she tends to push hard and tightens up.  Pt able to do supine exercises w/o any difficulties or increase in neck pain today. Increased (equal) bil passive rot'n post tx but not translated into sitting, and this may be due to her tendency to sit w/ forward head posture.  Pt continues to guard her back movements which also appears to be affecting her neck range.    Impairments in body function and structure:  Decreased PROM  Decreased AROM  Decreased Strength   Pain  Postural Dysfunction    Activity Limitations / Restricted Participation:  Can't get a good night's rest due to neck and back pain.  Progressing 08/27/17   Not able to look over shoulder while driving due to neck pain and tightness.  Progressing 08/27/17   Pressure from neck goes up to top of head.  Progressing 08/27/17   Reading/looking at phone causes the neck stiffness.   HA with neck  stiffness--everyday.   Rt side of neck/face feel swollen/stiff.  Resolved 09/15/17   Sit >1 hr and not able to standing >10 min causes back pain.   Neck pain w/ carrying grocery bags.  Progressing 09/19/17  Can't walk >20 min max due to back pain.        PLAN:   Patient requires continued skilled intervention in order to decrease pain. and meet above mentioned functional goals.  Focus on therex, manual therapy for neck, start postural exercises (for scapula).   Measure ROM, Complete NDI,    Therapist Signature:    [name], PT, MPT [phone]  Outpatient Speciality Rehab  Physical Medicine and Rehabilitation  [facility]  P: [phone]     09/19/2017

## 2017-09-22 ENCOUNTER — Ambulatory Visit: Payer: Medicare Other

## 2017-09-22 DIAGNOSIS — M542 Cervicalgia: Secondary | ICD-10-CM

## 2017-09-22 DIAGNOSIS — G8929 Other chronic pain: Secondary | ICD-10-CM

## 2017-09-22 DIAGNOSIS — G4486 Cervicogenic headache: Secondary | ICD-10-CM

## 2017-09-22 NOTE — Progress Notes (Signed)
[facility]  [address], [address]  [place], [place]  [address]  Phone:  [phone]  Fax:  [phone]    PHYSICAL THERAPY DAILY TREATMENT NOTE    PATIENT: Leslie Gross DOB: 08-Mar-1951   MR #: [id number]  AGE: 66 y.o.    FACILITY PROVIDER #: [id number] PRIMARY MD: Lana Fish, MD    HICN# Medicare Sub. Num: [id number] DIAGNOSES: Neck pain [M54.2]      Date of Service PT Received On: 09/22/17   Treatment Time Start Time: 1302 to Stop Time: 1413   Time Calculation Time Calculation (min): 71 min   Visit # PT Visit  PT Visit Number: 9/35   Units Billed   Therapeutic Interventions  $ PT Therapeutic Exercise (97110): 2 Units  $ PT Manual Therapy (32202): 2 Unit     [name] referred for physical therapy services by: Sabino Snipes, NP    Certification period, precautions, medications and allergies and goals copied from initial evaluation - reviewed and reconciled today.  [name], PT 08/13/2017    Certification Period:  08/11/17 to 11/03/17    Treatment Diagnosis:  Neck Pain [M54.2], Painful Cervical ROM [M54.2], Chronic Lower Back Pain without Radiculopathy  [M54.5, G89.29, Generalized Muscle Weakness [M62.81] and cervicogenic headache R51    Date of onset: 05/30/17    Imaging Performed:  See imaging in Epic.    Precautions:   Fall Risk  Orthopedic Injury:  ankle fracture  Osteoporosis    Goals:  Short Term Goals:  To be met by 8 visits  1 Increase active cervical rotation to >50 degrees for increased visual field to progress pt to be safe with changing lanes and backing up vehicle while driving.  Progressing 11/7  2 Pt. will report >50% improvement in ability to sleep through night without report of interruptions due to pain.  Progressing 09/17/17  3 Pt. will report decrease in frequency of headaches to less than 5 times per week.  Progressing 09/17/17  4 Improve NDI and Oswestry to  <35%  indicating improved fucntion and decreased  pain.    Long Term Goals:  To be met by 24 visits  4 Increase active cervical rotation to >60 degrees for increased visual field to allow safe changing lanes and backing up vehicle while driving w/ no > 5/42 neck pain.  5 Pt. will report ability to sleep through night without report of interruptions due to pain.  6 Increase strength of bilateral hips to at least 4/5  to improve patients ability to walk >20 minutes for grocery shopping, walking dog.  7  Pt will report >75% improvement in current functional status for improved quality of life.  8 Improve NDI and Oswestry to <20% indicating improved function and decreased pain.   Working Toward the Above Goals: (copied from last visit):    EXAMINATION:  Subjective Report: Pt reports that she hasn't had a HA since her last PT visit.     Pain:    0 neck at rest, w/ movement it increases to 2-3/10 and 1-2 back ache at rest.    Objective Findings today: Palpation:  +TTP bil upper cervial and mid erector spinae Rt > Lt.  Rt occiput, bil upper thoracic erector spinae Rt >Lt and bil UTs Rt > Lt.      INTERVENTION:  Treatment Performed:   MODALITIES:  MH to neck 10' post tx.  MANUAL THERAPY: Gentle manual cervical traction to decrease  muscle guarding, decrease pain.    Extensive STM/DTM to upper cervical, cervical erector spinae, bil UTs.   Flexion diagonal stretches; upper cervial rot'n mobs grade I-II. Grade I-II facet mobs for increasing rot'n.  THERAPEUTIC EXERCISES : 1:1 per exercise flow sheet to improve flexibility, ROM, strength, stability, balance, functional mobility.  TCs and VCs to perform exercise correctly.    Exercise Specifics Date 11/9 Date 11/12 Date 11/14 Date Date 12/5 Date 12/10   LTR  -  UBE: fwd 3' UBE: fwd 3'50" Fwd 4' Fwd 6'   SKC 10" hold   Rows: YTB x10-2 YTB x10-2 YTB x12-2 YTB x13-2   shldr ext bil - - bil shldr ext: YTB x10-2 YTB x10-2 YTB x12-2 YTB x13-2   Cervical retraction supine 5 sec x10  Seated 5" x5 5 sec x10 -2   5 sec x10-2 5 sec x10-2  5 sec x10-2 5 sec x10-2   C rot'n Passive activ rot'n +nods x5-2 Rt/Lt x10 each x10 each - Upper cervical rot'n: x10 rot'n + nods x 10 each side   C Side Bend passive Lateral neck stretch 10" x4 Rt/Lt 10" x2 Rt/Lt 10" x2 R/Lt 10" x2 r/tL 10" x2 r/tL -   upper trap stretch seated 10" x2 Rt/Lt 10"x2 R/Lt Cervical flxn 10" x2 10" x3 10" x4 10" x2   shldr rolls  x5 x5 scap add x10 - - 5 sec x5   Lateral neck stretch 10" hold - - x2 x3 bil x3 -   rot'n stretch 10" hold   x2 - - Supine rot'n w/ stretch 10" x2.   Lat pull downs resisted - - YTB x10-2 YTB x10-2 x12-2 YTB x12-2   Horiz abd resisted - - YTB x10-2 YTB x10-2 x12-2 YTB x12-2     Patient Education:   Patient was educated on importance of good posture, using MH on neck and stretching--doing ro'tn nods throughout the day.    Patient did verbalize and demonstrate an understanding of this education.   Home Exercise Program was reviewed today.    EVALUATION AND DIAGNOSIS:   Leslie Gross is a 66 y.o. female presents to physical therapy today with dx for neck pain and s/p L2 fracture.    Pt w/ mod difficulty w/ relaxing during manual therapy and PROM--she tends to push hard and tightens up.  Pt able to do supine exercises w/o any difficulties or increase in neck pain today. Increased (equal) bil passive rot'n post manual therapy but not translated into sitting, and this may be due to her tendency to sit w/ forward head posture.  Pt continues to guard her back w/ transitional movements which also appears to be affecting her neck range.    Impairments in body function and structure:  Decreased PROM  Decreased AROM  Decreased Strength   Pain  Postural Dysfunction    Activity Limitations / Restricted Participation:  Can't get a good night's rest due to neck and back pain.  Progressing 08/27/17   Not able to look over shoulder while driving due to neck pain and tightness.  Progressing 08/27/17   Pressure from neck goes up to top of head.  Progressing 08/27/17    Reading/looking at phone causes the neck stiffness.   HA with neck stiffness--everyday.   Rt side of neck/face feel swollen/stiff.  Resolved 09/15/17   Sit >1 hr and not able to standing >10 min causes back pain.   Neck pain w/ carrying grocery bags.  Progressing 09/19/17  Can't walk >20 min max due to back pain.        PLAN:   Patient requires continued skilled intervention in order to decrease pain. and meet above mentioned functional goals.  Focus on therex, manual therapy for neck, start postural exercises (for scapula).   Measure ROM, Complete NDI,    Therapist Signature:    [name], PT, MPT [phone]  Outpatient Speciality Rehab  Physical Medicine and Rehabilitation  [facility]  P: [phone]     09/22/2017

## 2017-09-24 ENCOUNTER — Ambulatory Visit: Payer: Medicare Other

## 2017-09-29 ENCOUNTER — Ambulatory Visit: Payer: Medicare Other

## 2017-09-29 DIAGNOSIS — M542 Cervicalgia: Secondary | ICD-10-CM

## 2017-09-29 DIAGNOSIS — G8929 Other chronic pain: Secondary | ICD-10-CM

## 2017-09-29 NOTE — Progress Notes (Signed)
[facility]  [address]  [place], [place]  [address]  Phone:  [phone]  Fax:  [phone]    PHYSICAL THERAPY DAILY TREATMENT NOTE    PATIENT: Leslie Gross DOB: September 19, 1951   MR #: [id number]  AGE: 66 y.o.    FACILITY PROVIDER #: [id number] PRIMARY MD: Lana Fish, MD    HICN# Medicare Sub. Num: [id number] DIAGNOSES: Neck pain [M54.2]      Date of Service PT Received On: 09/29/17   Treatment Time Start Time: 1303 to Stop Time: 1410   Time Calculation Time Calculation (min): 67 min   Visit # PT Visit  PT Visit Number: 10/35   Units Billed   Therapeutic Interventions  $ PT Therapeutic Exercise (97110): 2 Units  $ PT Manual Therapy (64332): 2 Unit     [name] referred for physical therapy services by: Sabino Snipes, NP    Certification period, precautions, medications and allergies and goals copied from initial evaluation - reviewed and reconciled today.  [name], PT 08/13/2017    Certification Period:  08/11/17 to 11/03/17    Treatment Diagnosis:  Neck Pain [M54.2], Painful Cervical ROM [M54.2], Chronic Lower Back Pain without Radiculopathy  [M54.5, G89.29, Generalized Muscle Weakness [M62.81] and cervicogenic headache R51    Date of onset: 05/30/17    Imaging Performed:  See imaging in Epic.    Precautions:   Fall Risk  Orthopedic Injury:  ankle fracture  Osteoporosis    Goals:  Short Term Goals:  To be met by 8 visits  1 Increase active cervical rotation to >50 degrees for increased visual field to progress pt to be safe with changing lanes and backing up vehicle while driving.  Progressing 11/7  2 Pt. will report >50% improvement in ability to sleep through night without report of interruptions due to pain.  Progressing 09/17/17  3 Pt. will report decrease in frequency of headaches to less than 5 times per week.  Progressing 09/17/17  4 Improve NDI and Oswestry to  <35%  indicating improved fucntion and decreased  pain.    Long Term Goals:  To be met by 24 visits  4 Increase active cervical rotation to >60 degrees for increased visual field to allow safe changing lanes and backing up vehicle while driving w/ no > 9/51 neck pain.  5 Pt. will report ability to sleep through night without report of interruptions due to pain.  6 Increase strength of bilateral hips to at least 4/5  to improve patients ability to walk >20 minutes for grocery shopping, walking dog.  7  Pt will report >75% improvement in current functional status for improved quality of life.  8 Improve NDI and Oswestry to <20% indicating improved function and decreased pain.   Working Toward the Above Goals: (copied from last visit):    EXAMINATION:  Subjective Report: Pt reports that she hasn't had a HA last two weeks.  She does stretches her neck a lot.  Pt reports that her back is now beginning to bother her now-- after standing for a while, lifting and post sitting for a while.    Pain:  0 neck at rest, w/ movement it increases to 2/10 and 1-2 back ache at rest.    Objective Findings today: supine AROM: rot'n WNL. Upper cervical tightness moderately.    Outcome measure:  NDI: 24    INTERVENTION:  Treatment Performed:   MODALITIES:  MH to  neck 10' post tx.  MANUAL THERAPY: Gentle manual cervical traction to decrease muscle guarding, decrease pain.    Extensive STM/DTM to upper cervical, cervical erector spinae, bil UTs.   Flexion diagonal stretches; upper cervial rot'n mobs grade I-II. Grade I-II facet mobs for increasing rot'n.  THERAPEUTIC EXERCISES : 1:1 per exercise flow sheet to improve flexibility, ROM, strength, stability, balance, functional mobility.  TCs and VCs to perform exercise correctly.    Exercise Specifics Date 11/12 Date 11/14 Date Date 12/5 Date 12/10 Date 12/17   LTR Fwd/retro  UBE: fwd 3' UBE: fwd 3'50" Fwd 4' Fwd 6' Fwd/retro 2'/2'   Rows resisted  Rows: YTB x10-2 YTB x10-2 YTB x12-2 YTB x13-2 RTB x10-2   shldr ext bil - bil shldr ext: YTB  x10-2 YTB x10-2 YTB x12-2 YTB x13-2 RTB x10-2   Cervical retraction supine 5 sec x10 -2   5 sec x10-2 5 sec x10-2 5 sec x10-2 5 sec x10-2 5 sec x10   C rot'n + rot'n x10 each x10 each - Upper cervical rot'n: x10 rot'n + nods x 10 each side x10 each   C Side Bend passive 10" x2 Rt/Lt 10" x2 R/Lt 10" x2 r/tL 10" x2 r/tL - -   upper trap stretch seated 10"x2 R/Lt Cervical flxn 10" x2 10" x3 10" x4 10" x2 -   shldr rolls  x5 scap add x10 - - 5 sec x5 5 sec x5   Lateral neck stretch 10" hold - x2 x3 bil x3 - -   rot'n stretch 10" hold  x2 - - Supine rot'n w/ stretch 10" x2. 10" x2   Lat pull downs resisted - YTB x10-2 YTB x10-2 x12-2 YTB x12-2 RTB x12-2   Horiz abd resisted - YTB x10-2 YTB x10-2 x12-2 YTB x12-2 RTB x10-2   SKC 10" - - - - - 2   LTR Supine h/l - - - - - x10   Pelvic tilt " - - - - - x10   bridge Supine h/l - - - - - x10     Patient Education:   Patient was educated on importance of good posture, using MH on neck and stretching--doing ro'tn nods throughout the day.    Patient did verbalize and demonstrate an understanding of this education.   Home Exercise Program was reviewed today.    EVALUATION AND DIAGNOSIS:   Leslie Gross is a 66 y.o. female presents to physical therapy today with dx for neck pain and s/p L2 fracture.    Pt w/ mod difficulty w/ relaxing during manual therapy and PROM--she tends to push hard and tightens up.    Pt is improving w/ pain and function (as indicated by NDI score.  Pt will benefit from shfiting focus from neck to back ( mid back) to learn exercises and strengthening exercise.  Pt continues to keep her self very tight and has excessively guarded movement pattern.    Impairments in body function and structure:  Decreased PROM  Decreased AROM  Decreased Strength   Pain  Postural Dysfunction    G-code: CK   Activity Limitations / Restricted Participation:  Can't get a good night's rest due to neck and back pain.  Progressing 08/27/17   Not able to look over shoulder while  driving due to neck pain and tightness.  Progressing 08/27/17   Pressure from neck goes up to top of head.  Progressing 08/27/17   Reading/looking at phone causes the neck stiffness.  HA with neck stiffness--everyday.   Rt side of neck/face feel swollen/stiff.  Resolved 09/15/17   Sit >1 hr and not able to standing >10 min causes back pain.   Neck pain w/ carrying grocery bags.  Progressing 09/19/17   Can't walk >20 min max due to back pain.        PLAN:   Patient requires continued skilled intervention in order to decrease pain. and meet above mentioned functional goals.  Focus on therex, manual therapy for neck, start postural exercises (for scapula).   Measure ROM, Assess functional status.    Therapist Signature:    [name], PT, MPT [phone]  Outpatient Speciality Rehab  Physical Medicine and Rehabilitation  [facility]  P: [phone]     09/29/2017

## 2017-10-01 ENCOUNTER — Ambulatory Visit: Payer: Medicare Other

## 2017-10-09 ENCOUNTER — Ambulatory Visit: Payer: Medicare Other

## 2017-10-13 ENCOUNTER — Ambulatory Visit: Payer: Medicare Other

## 2017-10-13 DIAGNOSIS — M542 Cervicalgia: Secondary | ICD-10-CM

## 2017-10-13 DIAGNOSIS — G8929 Other chronic pain: Secondary | ICD-10-CM

## 2017-10-13 DIAGNOSIS — S32020A Wedge compression fracture of second lumbar vertebra, initial encounter for closed fracture: Secondary | ICD-10-CM

## 2017-10-13 NOTE — Progress Notes (Signed)
[facility]  [address], [address]  [place], [place]  [address]  Phone:  [phone]  Fax:  [phone]    PHYSICAL THERAPY DAILY TREATMENT NOTE    PATIENT: Leslie Gross DOB: Dec 07, 1950   MR #: [id number]  AGE: 66 y.o.    FACILITY PROVIDER #: [id number] PRIMARY MD: Lana Fish, MD    HICN# Medicare Sub. Num: [id number] DIAGNOSES: Neck pain [M54.2]      Date of Service PT Received On: 10/13/17   Treatment Time Start Time: 1335 to Stop Time: 1430   Time Calculation Time Calculation (min): 55 min   Visit # PT Visit  PT Visit Number: 11/35   Units Billed   Therapeutic Interventions  $ PT Therapeutic Exercise (97110): 1 Unit  $ PT Manual Therapy (02725): 2 Unit     Leslie Gross referred for physical therapy services by: Sabino Snipes, NP    Certification period, precautions, medications and allergies and goals copied from initial evaluation - reviewed and reconciled today.  [name], PT 08/13/2017    Certification Period:  08/11/17 to 11/03/17    Treatment Diagnosis:  Neck Pain [M54.2], Painful Cervical ROM [M54.2], Chronic Lower Back Pain without Radiculopathy  [M54.5, G89.29, Generalized Muscle Weakness [M62.81] and cervicogenic headache R51    Date of onset: 05/30/17    Imaging Performed:  See imaging in Epic.    Precautions:   Fall Risk  Orthopedic Injury:  ankle fracture  Osteoporosis    Goals:  Short Term Goals:  To be met by 8 visits  1 Increase active cervical rotation to >50 degrees for increased visual field to progress pt to be safe with changing lanes and backing up vehicle while driving.  Progressing 11/7  2 Pt. will report >50% improvement in ability to sleep through night without report of interruptions due to pain.  Progressing 09/17/17  3 Pt. will report decrease in frequency of headaches to less than 5 times per week.  Progressing 09/17/17  4 Improve NDI and Oswestry to  <35%  indicating improved fucntion and decreased pain.   MET 09/29/17  Long Term Goals:  To be met by 24 visits  4 Increase active cervical rotation to >60 degrees for increased visual field to allow safe changing lanes and backing up vehicle while driving w/ no > 3/66 neck pain.  5 Pt. will report ability to sleep through night without report of interruptions due to pain.  6 Increase strength of bilateral hips to at least 4/5  to improve patients ability to walk >20 minutes for grocery shopping, walking dog.  7  Pt will report >75% improvement in current functional status for improved quality of life.  8 Improve NDI and Oswestry to <20% indicating improved function and decreased pain.   Working Toward the Above Goals: (copied from last visit):    EXAMINATION:  Subjective Report: Pt reports that she is sore all over, her neck hurts and so does her back.  She wants to work on her neck today.    Pain:  0 neck at rest, w/ movement it increases to 2/10 and 1-2 back ache at rest.    Objective Findings today: supine AROM: rot'n WNL. Upper cervical tightness moderately.    Outcome measure:  Not assessed  INTERVENTION:  Treatment Performed:   MODALITIES:  MH to neck 10' post tx.  MANUAL THERAPY: Gentle manual cervical traction to decrease muscle guarding, decrease pain.  Extensive STM/DTM to upper cervical, cervical erector spinae, bil UTs.   Flexion diagonal stretches; upper cervial rot'n mobs grade I-II. Grade I-II facet mobs for increasing rot'n.  THERAPEUTIC EXERCISES : 1:1 per exercise flow sheet to improve flexibility, ROM, strength, stability, balance, functional mobility.  TCs and VCs to perform exercise correctly.    Exercise Specifics Date 11/14 Date Date 12/5 Date 12/10 Date 12/17 Date 10/13/17   LTR Fwd/retro UBE: fwd 3' UBE: fwd 3'50" Fwd 4' Fwd 6' Fwd/retro 2'/2' Fwd 5'   Rows resisted Rows: YTB x10-2 YTB x10-2 YTB x12-2 YTB x13-2 RTB x10-2 RTB x10-2   shldr ext bil bil shldr ext: YTB x10-2 YTB x10-2 YTB x12-2 YTB x13-2 RTB x10-2 RTB x10-2   Cervical retraction  supine 5 sec x10-2 5 sec x10-2 5 sec x10-2 5 sec x10-2 5 sec x10 5 sec x10   C rot'n + rot'n x10 each - Upper cervical rot'n: x10 rot'n + nods x 10 each side x10 each    C Side Bend passive 10" x2 R/Lt 10" x2 r/tL 10" x2 r/tL - -    upper trap stretch seated Cervical flxn 10" x2 10" x3 10" x4 10" x2 -    shldr rolls  scap add x10 - - 5 sec x5 5 sec x5    Lateral neck stretch 10" hold x2 x3 bil x3 - -    rot'n stretch 10" hold x2 - - Supine rot'n w/ stretch 10" x2. 10" x2    Lat pull downs resisted YTB x10-2 YTB x10-2 x12-2 YTB x12-2 RTB x12-2 RTB x10-2   Horiz abd resisted YTB x10-2 YTB x10-2 x12-2 YTB x12-2 RTB x10-2 RTB x10-2   SKC 10" - - - - 2    LTR Supine h/l - - - - x10    Pelvic tilt " - - - - x10    bridge Supine h/l - - - - x10      Patient Education:   Patient was educated on importance of good posture, using MH on neck and stretching--doing ro'tn nods throughout the day.    Patient did verbalize and demonstrate an understanding of this education.   Home Exercise Program was reviewed today.    EVALUATION AND DIAGNOSIS:   Leslie Gross is a 66 y.o. female presents to physical therapy today with dx for neck pain and s/p L2 fracture.    Pt w/ mod difficulty w/ relaxing during manual therapy and PROM--she tends to push hard and tightens up.    Pt continues to keep her self very tight and has excessively guarded movement pattern. Pt's neck improves post manual therapy and therex but appears to have no carry over from week to week.  This may be her tendency to not do isolated neck movements--compensates w/ trunk movements.  Deferred ROM assessment today due to increased neck stiffness.    Impairments in body function and structure:  Decreased PROM  Decreased AROM  Decreased Strength   Pain  Postural Dysfunction      Activity Limitations / Restricted Participation:  Can't get a good night's rest due to neck and back pain.  Progressing 08/27/17   Not able to look over shoulder while driving due to neck pain  and tightness.  Progressing 08/27/17   Pressure from neck goes up to top of head.  Resolved 10/13/17   Reading/looking at phone causes the neck stiffness.     HA with neck stiffness--everyday.  Resolved 10/13/17   Rt  side of neck/face feel swollen/stiff.  Resolved 10/13/17   Sit >1 hr and not able to standing >10 min causes back pain.  Can stand up 15-20 min before back pain starts, can sit in recliner for couple of hrs.   Neck pain w/ carrying grocery bags.  Progressing 09/19/17   Can't walk >20 min max due to back pain.        PLAN:   Patient requires continued skilled intervention in order to decrease pain. and meet above mentioned functional goals.  Focus on therex, manual therapy for neck, start postural exercises (for scapula).   Assess ROM next visit.    Therapist Signature:    [name], PT, MPT [phone]  Outpatient Speciality Rehab  Physical Medicine and Rehabilitation  [facility]  P: [phone]     10/15/2017

## 2017-10-15 ENCOUNTER — Ambulatory Visit: Payer: Medicare Other | Attending: Nurse Practitioner

## 2017-10-15 ENCOUNTER — Ambulatory Visit: Payer: Medicare Other

## 2017-10-15 DIAGNOSIS — G8929 Other chronic pain: Secondary | ICD-10-CM | POA: Insufficient documentation

## 2017-10-15 DIAGNOSIS — M545 Low back pain, unspecified: Secondary | ICD-10-CM

## 2017-10-15 DIAGNOSIS — M6281 Muscle weakness (generalized): Secondary | ICD-10-CM | POA: Insufficient documentation

## 2017-10-15 DIAGNOSIS — M542 Cervicalgia: Secondary | ICD-10-CM | POA: Insufficient documentation

## 2017-10-15 DIAGNOSIS — R51 Headache: Secondary | ICD-10-CM | POA: Insufficient documentation

## 2017-10-15 DIAGNOSIS — S32020A Wedge compression fracture of second lumbar vertebra, initial encounter for closed fracture: Secondary | ICD-10-CM

## 2017-10-15 NOTE — Progress Notes (Signed)
[facility]  [address]  [place], [place]  [address]  Phone:  [phone]  Fax:  [phone]    PHYSICAL THERAPY DAILY TREATMENT NOTE    PATIENT: Leslie Gross DOB: [birth year]   MR #: [id number]  AGE: 67 y.o.    FACILITY PROVIDER #: [id number] PRIMARY MD: Lana Fish, MD    HICN# Medicare Sub. Num: [id number] DIAGNOSES: Neck pain [M54.2]      Date of Service PT Received On: 10/15/17   Treatment Time Start Time: 1304 to Stop Time: 1415   Time Calculation Time Calculation (min): 71 min   Visit # PT Visit  PT Visit Number: 12/35   Units Billed   Therapeutic Interventions  $ PT Therapeutic Exercise (97110): 3 Units  $ PT Manual Therapy (97140): 1 Unit     [name] referred for physical therapy services by: Sabino Snipes, NP    Certification period, precautions, medications and allergies and goals copied from initial evaluation - reviewed and reconciled today.  [name], PT 10/15/17    Certification Period:  08/11/17 to 11/03/17    Treatment Diagnosis:  Neck Pain [M54.2], Painful Cervical ROM [M54.2], Chronic Lower Back Pain without Radiculopathy  [M54.5, G89.29, Generalized Muscle Weakness [M62.81] and cervicogenic headache R51    Date of onset: 05/30/17    Imaging Performed:  See imaging in Epic.    Precautions:   Fall Risk  Orthopedic Injury:  ankle fracture  Osteoporosis    Goals:  Short Term Goals: To be met by 8 visits  1Increase active cervical rotation to >50degrees for increased visual field to progress pt to besafe with changing lanes and backing up vehicle while driving.  Progressing 11/7  2Pt. will report >50% improvement in ability to sleep through night without report of interruptions due to pain.  Progressing 09/17/17  3Pt. will report decrease in frequency of headaches to less than 5times per week.  MET 10/15/17  4Improve NDI and Oswestryto <35% indicating improved fucntion and decreased pain.  MET  09/29/17  Long Term Goals: To be met by 24 visits  4Increase active cervical rotation to >60degrees for increased visual field to allow safe changing lanes and backing up vehicle while driving w/ no > 4/40 neck pain.  5Pt. will report ability to sleep through night without report of interruptions due to pain.  6 Increase strength of bilateral hips to at least 4/5to improve patients ability to walk >61minutes for grocery shopping, walking dog.  7 Pt will report >75% improvement in current functional status for improved quality of life.  8 Improve NDI and Oswestry to <20% indicating improved function and decreased pain.   Working Toward the Above Goals: (copied from last visit):    EXAMINATION:  Subjective Report: Pt reports that her back is sore today and having a hard time walking due to being on her feet for hours today.  Pain:  0 neck at rest, w/ movement it increases to 2/10 and 1-2 back ache at rest.    Objective Findings today: supine AROM: rot'n WNL. Upper cervical tightness moderately.    Outcome measure:  Not measured today.    INTERVENTION:  Treatment Performed:   MODALITIES:  MH to neck and Low back post tx.  MANUAL THERAPY: Gentle manual cervical traction to decrease muscle guarding, decrease pain.    Extensive STM/DTM to upper cervical, cervical erector spinae, bil UTs.   Flexion diagonal stretches; upper cervial rot'n  mobs grade I-II. Grade I-II facet mobs for increasing rot'n.  THERAPEUTIC EXERCISES : 1:1 per exercise flow sheet to improve flexibility, ROM, strength, stability, balance, functional mobility.  TCs and VCs to perform exercise correctly.    Exercise Specifics Date 12/5 Date 12/10 Date 12/17 Date 10/13/17 Date 10/15/17   LTR Fwd/retro Fwd 4' Fwd 6' Fwd/retro 2'/2' Fwd 5' Nu step w/ MH to back: 8' (UE/LE)   Rows resisted YTB x12-2 YTB x13-2 RTB x10-2 RTB x10-2 RTB x12-2   shldr ext bil YTB x12-2 YTB x13-2 RTB x10-2 RTB x10-2 RTB x12-2   Cervical retraction supine 5 sec x10-2 5 sec  x10-2 5 sec x10 - 5 sec x10   C rot'n + rot'n Upper cervical rot'n: x10 rot'n + nods x 10 each side x10 each - Hip 3 ways x10 Rt/Lt   C Side Bend passive 10" x2 r/tL - - - Calf stretch: wedge 30" x3   upper trap stretch seated 10" x4 10" x2 - - HS stretch (passive): 30" x3 Rt/Lt   shldr rolls  - 5 sec x5 5 sec x5 - SKC 10" x 3 passive bil.   Lateral neck stretch 10" hold x3 - - - X2; levt scap x2   rot'n stretch 10" hold - Supine rot'n w/ stretch 10" x2. 10" x2 - Seated: 10" x1 Rt/Lt   Lat pull downs resisted x12-2 YTB x12-2 RTB x12-2 RTB x10-2 RTB x12-2   Horiz abd resisted x12-2 YTB x12-2 RTB x10-2 RTB x10-2 RTB x12-2   SKC 10" - - 2 - x2   LTR Supine h/l - - x10 - x4   Pelvic tilt " - - x10 - x4   bridge Supine h/l - - x10 - x10     Patient Education:   Patient was educated on importance of good posture, using MH on neck and stretching--doing ro'tn nods throughout the day.    Patient did verbalize and demonstrate an understanding of this education.   Home Exercise Program was reviewed today.    EVALUATION AND DIAGNOSIS:   Leslie Gross is a 67 y.o. female presents to physical therapy today with dx for neck pain and s/p L2 fracture.    Pt w/ mod difficulty w/ relaxing during manual therapy and PROM--she tends to push hard and tightens up.    Good challenge w/ therex today.  Pt w/ weakness w/ SLS activity and requires rest due to back pain.  She will benefit from therex to strengthen LEs, manual therapy for mid/low back for decreasing guarding and increasing mobility.    Impairments in body function and structure:  Decreased PROM  Decreased AROM  Decreased Strength   Pain  Postural Dysfunction    Activity Limitations / Restricted Participation:  Can't get a good night's rest due to neck and back pain.  Progressing 08/27/17   Not able to look over shoulder while driving due to neck pain and tightness.  Progressing 08/27/17   Pressure from neck goes up to top of head.  Resolved 10/13/17   Reading/looking at phone  causes the neck stiffness.     HA with neck stiffness--everyday.  Resolved 10/13/17   Rt side of neck/face feel swollen/stiff.  Resolved 10/13/17   Sit >1 hr and not able to standing >10 mincauses back pain.  Can stand up 15-20 min before back pain starts, can sit in recliner for couple of hrs.   Neck pain w/ carrying grocery bags.  Progressing 09/19/17  Can't walk >20 min max due to back pain.      PLAN:   Patient requires continued skilled intervention in order to decrease pain. and meet above mentioned functional goals.  Focus on therex, manual therapy for neck, back, strengthening exercise for neck and back.  Measure ROM of neck and back next visit.    Therapist Signature:    [name], PT, MPT [phone]  Outpatient Speciality Rehab  Physical Medicine and Rehabilitation  [facility]  P: [phone]     10/15/2017

## 2017-10-20 ENCOUNTER — Ambulatory Visit: Payer: Medicare Other

## 2017-10-20 DIAGNOSIS — G8929 Other chronic pain: Secondary | ICD-10-CM

## 2017-10-20 DIAGNOSIS — S32020A Wedge compression fracture of second lumbar vertebra, initial encounter for closed fracture: Secondary | ICD-10-CM

## 2017-10-20 DIAGNOSIS — M542 Cervicalgia: Secondary | ICD-10-CM

## 2017-10-20 NOTE — Progress Notes (Addendum)
[facility]  [address], [address]  [place], [place]  [address]  Phone:  [phone]  Fax:  [phone]    PHYSICAL THERAPY DAILY TREATMENT NOTE    PATIENT: Leslie Gross DOB: 27-Dec-1950   MR #: [id number]  AGE: 67 y.o.    FACILITY PROVIDER #: [id number] PRIMARY MD: Leslie Fish, MD    HICN# Medicare Sub. Num: [id number] DIAGNOSES: Neck pain [M54.2]      Date of Service PT Received On: 10/20/17   Treatment Time Start Time: 1305 to Stop Time: 1415   Time Calculation Time Calculation (min): 70 min   Visit # PT Visit  PT Visit Number: 13/35   Units Billed   Therapeutic Interventions  $ PT Therapeutic Exercise (97110): 1 Unit  $ PT Manual Therapy (97140): 2 Unit  $ PT Therapeutic Activity (97530): 1 unit     Leslie Gross referred for physical therapy services by: Leslie Snipes, NP    Certification period, precautions, medications and allergies and goals copied from initial evaluation - reviewed and reconciled today.  Leslie Gross, PT 10/15/17    Certification Period:  08/11/17 to 11/03/17    Treatment Diagnosis:  Neck Pain [M54.2], Painful Cervical ROM [M54.2], Chronic Lower Back Pain without Radiculopathy  [M54.5, G89.29, Generalized Muscle Weakness [M62.81] and cervicogenic headache R51    Date of onset: 05/30/17    Imaging Performed:  See imaging in Epic.    Precautions:   Fall Risk  Orthopedic Injury:  ankle fracture  Osteoporosis    Goals:  Short Term Goals: To be met by 8 visits  1Increase active cervical rotation to >50degrees for increased visual field to progress pt to besafe with changing lanes and backing up vehicle while driving.  Progressing 11/7  2Pt. will report >50% improvement in ability to sleep through night without report of interruptions due to pain.  Progressing 09/17/17  3Pt. will report decrease in frequency of headaches to less than 5times per week.  MET 10/15/17  4Improve NDI and Oswestryto <35% indicating improved  fucntion and decreased pain.  MET 09/29/17  Long Term Goals: To be met by 24 visits  4Increase active cervical rotation to >60degrees for increased visual field to allow safe changing lanes and backing up vehicle while driving w/ no > 4/40 neck pain.  5Pt. will report ability to sleep through night without report of interruptions due to pain.  6 Increase strength of bilateral hips to at least 4/5to improve patients ability to walk >59minutes for grocery shopping, walking dog.  7 Pt will report >75% improvement in current functional status for improved quality of life.  8 Improve NDI and Oswestry to <20% indicating improved function and decreased pain.   Working Toward the Above Goals: (copied from last visit):    EXAMINATION:  Subjective Report: Pt reports that her back is sore today and having a hard time walking due to back pain but feels like she can turn her head more when driving.  Pt is concerned about her BP being high and wants to check her BP against her     Pain:  0 neck at rest, w/ movement it increases to 2/10 and 1-2 back ache at rest.    Objective Findings today: supine AROM: rot'n WNL. Upper cervical tightness moderately.  BP measurements: pt's #1 cuff 141/83; 72 HR; pt's #2 cuff 147/84, 70 HR. #Clinic #1 117/55 BP, 69 HR; Clinic #2 106/57 BP, 71 HR; pt's  137/86 BP, 67 HR; Clinic #3 110/63 BP, 72 HR.    Outcome measure: not measured today.    INTERVENTION:  Treatment Performed:   MODALITIES:  MH to neck and Low back post tx.  MANUAL THERAPY: STM/DTM to mid thoracic to lower lumbar erector spinae (in Lt side lying); passive HS stretch Rt/Lt.  Both to decrease pain, decrease stifness, increase ROM.  THERAPEUTIC EXERCISES : 1:1 per exercise flow sheet to improve flexibility, ROM, strength, stability, balance, functional mobility.  TCs and VCs to perform exercise correctly.  THERAPEUTIC ACTIVITY: BP measurements, pt education on posture.    Exercise Specifics Date 12/10 Date 12/17 Date 10/13/17  Date 10/15/17  Date 10/20/17   LTR Fwd/retro Fwd 6' Fwd/retro 2'/2' Fwd 5' Nu step w/ MH to back: 8' (UE/LE) Nu step w/ MH to back: LE only 8' seat 9   Rows resisted YTB x13-2 RTB x10-2 RTB x10-2 RTB x12-2 -   shldr ext bil YTB x13-2 RTB x10-2 RTB x10-2 RTB x12-2 -   Cervical retraction supine 5 sec x10-2 5 sec x10 - 5 sec x10 -   C rot'n + rot'n rot'n + nods x 10 each side x10 each - Hip 3 ways x10 Rt/Lt -   C Side Bend passive - - - Calf stretch: wedge 30" x3 x3   upper trap stretch seated 10" x2 - - HS stretch (passive): 30" x3 Rt/Lt 30'x2 Rt/Lt   shldr rolls  5 sec x5 5 sec x5 - SKC 10" x 3 passive bil. 10" x2   Lateral neck stretch 10" hold - - - X2; levt scap x2    rot'n stretch 10" hold Supine rot'n w/ stretch 10" x2. 10" x2 - Seated: 10" x1 Rt/Lt    Lat pull downs resisted YTB x12-2 RTB x12-2 RTB x10-2 RTB x12-2    Horiz abd resisted YTB x12-2 RTB x10-2 RTB x10-2 RTB x12-2    SKC 10" - 2 - x2    LTR Supine h/l - x10 - x4    Pelvic tilt " - x10 - x4    bridge Supine h/l - x10 - x10      Patient Education:   Patient was educated on importance of good posture, using MH on back and stretching.  Patient did verbalize and demonstrate an understanding of this education.   Home Exercise Program was reviewed today.    EVALUATION AND DIAGNOSIS:   Leslie Gross is a 67 y.o. female presents to physical therapy today with dx for neck pain and s/p L2 fracture.    Pt w/ mod difficulty w/ relaxing during manual therapy and PROM--she tends to push hard and tightens up.    Good challenge w/ therex today.  Pt w/ weakness w/ SLS activity and requires rest due to back pain.  She will benefit from therex to strengthen LEs, manual therapy for mid/low back for decreasing guarding and increasing mobility.    Impairments in body function and structure:  Decreased PROM  Decreased AROM  Decreased Strength   Pain  Postural Dysfunction    Activity Limitations / Restricted Participation:  Can't get a good night's rest due to neck and back  pain.  Progressing 08/27/17   Not able to look over shoulder while driving due to neck pain and tightness.  Progressing 08/27/17   Pressure from neck goes up to top of head.  Resolved 10/13/17   Reading/looking at phone causes the neck stiffness.     HA with neck  stiffness--everyday.  Resolved 10/13/17   Rt side of neck/face feel swollen/stiff.  Resolved 10/13/17   Sit >1 hr and not able to standing >10 mincauses back pain.  Can stand up 15-20 min before back pain starts, can sit in recliner for couple of hrs.   Neck pain w/ carrying grocery bags.  Progressing 09/19/17   Can't walk >20 min max due to back pain.      PLAN:   Patient requires continued skilled intervention in order to decrease pain. and meet above mentioned functional goals.  Focus on therex, manual therapy for neck, back, strengthening exercise for neck and back.  Measure ROM of neck and back next visit.    Therapist Signature:    Leslie Gross, PT, MPT [phone]  Outpatient Speciality Rehab  Physical Medicine and Rehabilitation  [facility]  P: [phone]     10/22/2017

## 2017-10-22 ENCOUNTER — Ambulatory Visit: Payer: Medicare Other

## 2017-10-22 DIAGNOSIS — M542 Cervicalgia: Secondary | ICD-10-CM

## 2017-10-22 DIAGNOSIS — S32020A Wedge compression fracture of second lumbar vertebra, initial encounter for closed fracture: Secondary | ICD-10-CM

## 2017-10-22 DIAGNOSIS — G8929 Other chronic pain: Secondary | ICD-10-CM

## 2017-10-22 DIAGNOSIS — G4486 Cervicogenic headache: Secondary | ICD-10-CM

## 2017-10-22 NOTE — Progress Notes (Signed)
[facility]  [address]  [place], [place]  [address]  Phone:  [phone]  Fax:  [phone]    PHYSICAL THERAPY DAILY TREATMENT NOTE    PATIENT: Leslie Gross DOB: 04/24/1951   MR #: [id number]  AGE: 67 y.o.    FACILITY PROVIDER #: [id number] PRIMARY MD: Lana Fish, MD    HICN# Medicare Sub. Num: [id number] DIAGNOSES: Neck pain [M54.2]      Date of Service PT Received On: 10/22/17   Treatment Time Start Time: 1305 to Stop Time: 1418   Time Calculation Time Calculation (min): 73 min   Visit # PT Visit  PT Visit Number: 14/35   Units Billed   Therapeutic Interventions  $ PT Therapeutic Exercise (97110): 2 Units  $ PT Manual Therapy (02725): 2 Unit     [name] referred for physical therapy services by: Sabino Snipes, NP    Certification period, precautions, medications and allergies and goals copied from initial evaluation - reviewed and reconciled today.  [name], PT 10/15/17    Certification Period:  08/11/17 to 11/03/17    Treatment Diagnosis:  Neck Pain [M54.2], Painful Cervical ROM [M54.2], Chronic Lower Back Pain without Radiculopathy  [M54.5, G89.29, Generalized Muscle Weakness [M62.81] and cervicogenic headache R51    Date of onset: 05/30/17    Imaging Performed:  See imaging in Epic.    Precautions:   Fall Risk  Orthopedic Injury:  ankle fracture  Osteoporosis    Goals:  Short Term Goals: To be met by 8 visits  1Increase active cervical rotation to >50degrees for increased visual field to progress pt to besafe with changing lanes and backing up vehicle while driving.  Progressing 11/7  2Pt. will report >50% improvement in ability to sleep through night without report of interruptions due to pain.  Progressing 09/17/17  3Pt. will report decrease in frequency of headaches to less than 5times per week.  MET 10/15/17  4Improve NDI and Oswestryto <35% indicating improved fucntion and decreased pain.  MET  09/29/17  Long Term Goals: To be met by 24 visits  4Increase active cervical rotation to >60degrees for increased visual field to allow safe changing lanes and backing up vehicle while driving w/ no > 3/66 neck pain.  5Pt. will report ability to sleep through night without report of interruptions due to pain.  6 Increase strength of bilateral hips to at least 4/5to improve patients ability to walk >62minutes for grocery shopping, walking dog.  7 Pt will report >75% improvement in current functional status for improved quality of life.  8 Improve NDI and Oswestry to <20% indicating improved function and decreased pain.   Working Toward the Above Goals: (copied from last visit):    EXAMINATION:  Subjective Report: Pt reports that her neck and back is sore today--thinks it is from the weather.   The neck is more stiff today then it was on Monday.    Pain:  0 neck at rest, w/ movement it increases to 3-4/10 and 2 back ache at rest w/ movement 3-4/10    Objective Findings today: L-flxn: lower 3/4 of ankle w/ low back pain; SB bil knee joint line.  Cervical AROM: flxn 43 deg, ext 32 deg, Rot'n Rt 48 deg, Lt 46 deg.  Post MH and manual therapy: rot'n decreased by 50% bil.  PROM in supine: >75 deg bil (gross)    Outcome measure: not measured today.  INTERVENTION:  Treatment Performed:   MODALITIES:  MH to neck and Low back post tx.  MANUAL THERAPY: STM/DTM cervical erector spinae, bil UTs, anterior chest.  CRC to increase rot'n both sides.  NMR at end range of rot'n w/ isometric hold.  THERAPEUTIC EXERCISES : 1:1 per exercise flow sheet to improve flexibility, ROM, strength, stability, balance, functional mobility.  TCs and VCs to perform exercise correctly.    Exercise Specifics Date 12/10 Date 12/17 Date 10/13/17 Date 10/15/17  Date 10/20/17 Date 10/22/17   LTR Fwd/retro Fwd 6' Fwd/retro 2'/2' Fwd 5' Nu step w/ MH to back: 8' (UE/LE) Nu step w/ MH to back: LE only 8' seat 9 UBE: 3'/2'   Rows resisted YTB x13-2 RTB  x10-2 RTB x10-2 RTB x12-2 - RTB x12-2   shldr ext bil YTB x13-2 RTB x10-2 RTB x10-2 RTB x12-2 - RTB x12-2   Cervical retraction supine 5 sec x10-2 5 sec x10 - 5 sec x10 -    C rot'n + rot'n rot'n + nods x 10 each side x10 each - Hip 3 ways x10 Rt/Lt - x10 Rt/Lt   C Side Bend passive - - - Calf stretch: wedge 30" x3 x3 x3   upper trap stretch seated 10" x2 - - HS stretch (passive): 30" x3 Rt/Lt 30'x2 Rt/Lt -   shldr rolls  5 sec x5 5 sec x5 - SKC 10" x 3 passive bil. 10" x2 -   Lateral neck stretch 10" hold - - - X2; levt scap x2  10" x2   rot'n stretch 10" hold Supine rot'n w/ stretch 10" x2. 10" x2 - Seated: 10" x1 Rt/Lt  10" x2   Lat pull downs resisted YTB x12-2 RTB x12-2 RTB x10-2 RTB x12-2 - GTB x10-2   Horiz abd resisted YTB x12-2 RTB x10-2 RTB x10-2 RTB x12-2  RTB x10-2 (sitting)   SKC 10" - 2 - x2  -   LTR Supine h/l - x10 - x4  -   Pelvic tilt " - x10 - x4  -   bridge Supine h/l - x10 - x10  -     Patient Education:   Patient was educated on importance of good posture, using MH on back and neck in the evening and stretching.  Patient did verbalize and demonstrate an understanding of this education.   Home Exercise Program was not addressed today.    EVALUATION AND DIAGNOSIS:   Leslie Gross is a 67 y.o. female presents to physical therapy today with dx for neck pain and s/p L2 fracture.    Pt w/ mod difficulty w/ relaxing during manual therapy and PROM--she tends to push hard and tightens up.    Good challenge w/ therex today.  There is no carry over from week to week w/ ROM gains.      Impairments in body function and structure:  Decreased PROM  Decreased AROM  Decreased Strength   Pain  Postural Dysfunction    Activity Limitations / Restricted Participation:  Can't get a good night's rest due to neck and back pain.  Progressing 08/27/17   Not able to look over shoulder while driving due to neck pain and tightness.  Progressing 08/27/17   Pressure from neck goes up to top of head.  Resolved 10/13/17    Reading/looking at phone causes the neck stiffness.     HA with neck stiffness--everyday.  Resolved 10/13/17   Rt side of neck/face feel swollen/stiff.  Resolved 10/13/17  Sit >1 hr and not able to standing >10 mincauses back pain.  Can stand up 15-20 min before back pain starts, can sit in recliner for couple of hrs.   Neck pain w/ carrying grocery bags.  Progressing 09/19/17   Can't walk >20 min max due to back pain.      PLAN:   Patient requires continued skilled intervention in order to decrease pain. and meet above mentioned functional goals.  Focus on therex, manual therapy for neck, back, strengthening exercise for neck and back.  Assess functional status and goal status next visit.  Increased focus on back exercises and back next visit.    Therapist Signature:    [name], PT, MPT [phone]  Outpatient Speciality Rehab  Physical Medicine and Rehabilitation  [facility]  P: [phone]     10/22/2017

## 2017-10-27 ENCOUNTER — Ambulatory Visit: Payer: Medicare Other

## 2017-10-29 ENCOUNTER — Ambulatory Visit: Payer: Medicare Other

## 2017-10-29 DIAGNOSIS — M542 Cervicalgia: Secondary | ICD-10-CM

## 2017-10-29 DIAGNOSIS — M545 Low back pain, unspecified: Secondary | ICD-10-CM

## 2017-10-29 DIAGNOSIS — G8929 Other chronic pain: Secondary | ICD-10-CM

## 2017-10-29 DIAGNOSIS — S32020A Wedge compression fracture of second lumbar vertebra, initial encounter for closed fracture: Secondary | ICD-10-CM

## 2017-10-29 NOTE — Progress Notes (Signed)
[facility]  [address], [address]  [place], [place]  [address]  Phone:  [phone]  Fax:  [phone]    PHYSICAL THERAPY DAILY TREATMENT NOTE    PATIENT: Leslie Gross DOB: [birth year]   MR #: [id number]  AGE: 67 y.o.    FACILITY PROVIDER #: [id number] PRIMARY MD: Lana Fish, MD    HICN# Medicare Sub. Num: [id number] DIAGNOSES: Neck pain [M54.2]; back pain      Date of Service PT Received On: 10/29/17   Treatment Time Start Time: 1305 to Stop Time: 1400   Time Calculation Time Calculation (min): 55 min   Visit # PT Visit  PT Visit Number: 15/35   Units Billed   Therapeutic Interventions  $ PT Manual Therapy (97140): 4 Unit     [name] referred for physical therapy services by: Sabino Snipes, NP    Certification period, precautions, medications and allergies and goals copied from initial evaluation - reviewed and reconciled today.  [name], PT 10/15/17    Certification Period:  08/11/17 to 11/03/17    Treatment Diagnosis:  Neck Pain [M54.2], Painful Cervical ROM [M54.2], Chronic Lower Back Pain without Radiculopathy  [M54.5, G89.29, Generalized Muscle Weakness [M62.81] and cervicogenic headache R51    Date of onset: 05/30/17    Imaging Performed:  See imaging in Epic.    Precautions:   Fall Risk  Orthopedic Injury:  ankle fracture  Osteoporosis    Goals:  Short Term Goals: To be met by 8 visits  1Increase active cervical rotation to >50degrees for increased visual field to progress pt to besafe with changing lanes and backing up vehicle while driving.  Progressing 11/7  2Pt. will report >50% improvement in ability to sleep through night without report of interruptions due to pain.  Progressing 09/17/17  3Pt. will report decrease in frequency of headaches to less than 5times per week.  MET 10/15/17  4Improve NDI and Oswestryto <35% indicating improved fucntion and decreased pain.  MET 09/29/17  Long Term Goals: To be met by  24 visits  4Increase active cervical rotation to >60degrees for increased visual field to allow safe changing lanes and backing up vehicle while driving w/ no > 5/63 neck pain.  5Pt. will report ability to sleep through night without report of interruptions due to pain.  6 Increase strength of bilateral hips to at least 4/5to improve patients ability to walk >18minutes for grocery shopping, walking dog.  7 Pt will report >75% improvement in current functional status for improved quality of life.  8 Improve NDI and Oswestry to <20% indicating improved function and decreased pain.   Working Toward the Above Goals: (copied from last visit):    EXAMINATION:  Subjective Report: Pt reports that her neck and back is sore today.  Over the weekend her neck was so sore that she had to take flexaril and vicodin to relax.  Neck pain isn't that bad her mid and lower back is very painful.  Can't move properly.  Denis overdoing any house work or snow removal over the weekend.    Pain:  0 neck at rest, w/ movement it increases to 4-5/10 and 2 back ache at rest w/ movement 5-6/10    Objective Findings today: Palpation: +TTP Rt mid thoracic to upper thoracic erector spinae, Rt L-erector spinae, bil upper cervical tightness and Rt > Lt pain.    Outcome measure: not measured today.    INTERVENTION:  Treatment Performed:   MODALITIES:  None today.  MANUAL THERAPY: Pt in prone pos'n w/ pillows under hip and shin.  STM/DTM cervical erector spinae, bil UTs, bil thoracis and L/S erector spinae, grade I p/a glide to decrease pain and guarding.  Pt in supine pos'n STM/DTM/MFR to upper cervical erector spinae, lateral jaw.  PROM to end range rot'n, flexion diagonals, upper cervical flxn and rot'n mobs w/ grade I at facets; seated MWMs to increase cervical rot'n and passive stretch of bil pectoralis and introducing thoracic extension w/ pillow behind pt's back and PT passively stretching shoulders back.  NMR at end range of rot'n w/  isometric hold.  THERAPEUTIC EXERCISES : no charge.  Cervical rot'n w/ over pressure; scapular adduction/retraction; supine LTR x10. Cervical retraction x10.    Exercise Specifics Date 12/10 Date 12/17 Date 10/13/17 Date 10/15/17  Date 10/20/17 Date 10/22/17   UBE Fwd/retro Fwd 6' Fwd/retro 2'/2' Fwd 5' Nu step w/ MH to back: 8' (UE/LE) Nu step w/ MH to back: LE only 8' seat 9 UBE: 3'/2'   Rows resisted YTB x13-2 RTB x10-2 RTB x10-2 RTB x12-2 - RTB x12-2   shldr ext bil YTB x13-2 RTB x10-2 RTB x10-2 RTB x12-2 - RTB x12-2   Cervical retraction supine 5 sec x10-2 5 sec x10 - 5 sec x10 -    C rot'n + rot'n rot'n + nods x 10 each side x10 each - Hip 3 ways x10 Rt/Lt - x10 Rt/Lt   C Side Bend passive - - - Calf stretch: wedge 30" x3 x3 x3   upper trap stretch seated 10" x2 - - HS stretch (passive): 30" x3 Rt/Lt 30'x2 Rt/Lt -   shldr rolls  5 sec x5 5 sec x5 - SKC 10" x 3 passive bil. 10" x2 -   Lateral neck stretch 10" hold - - - X2; levt scap x2  10" x2   rot'n stretch 10" hold Supine rot'n w/ stretch 10" x2. 10" x2 - Seated: 10" x1 Rt/Lt  10" x2   Lat pull downs resisted YTB x12-2 RTB x12-2 RTB x10-2 RTB x12-2 - GTB x10-2   Horiz abd resisted YTB x12-2 RTB x10-2 RTB x10-2 RTB x12-2  RTB x10-2 (sitting)   SKC 10" - 2 - x2  -   LTR Supine h/l - x10 - x4  -   Pelvic tilt " - x10 - x4  -   bridge Supine h/l - x10 - x10  -     Patient Education:   Patient was educated on importance of good posture, using MH on back and neck in the evening and stretching.  Reviewed HEP.  Patient did verbalize and demonstrate an understanding of this education.   Home Exercise Program was reviewed today.    EVALUATION AND DIAGNOSIS:   [name] is a 67 y.o. female presents to physical therapy today with dx for neck pain and s/p L2 fracture.    Pt w/ WNL supine cervial rot'n but not translating it into sitting--most likely due to upper cervical tightness.  Pt also able to turn head more after over pressure self stretching.  Pt will benefit  from continuing w/ therex and supine and seated cervical exercises.     Impairments in body function and structure:  Decreased PROM  Decreased AROM  Decreased Strength   Pain  Postural Dysfunction    Activity Limitations / Restricted Participation:  Can't get a good night's rest due to neck and back pain.  Progressing 10/29/17   Not able to look over shoulder while driving due to neck pain and tightness.  Progressing 10/29/17   Pressure from neck goes up to top of head.  Resolved 10/13/17   Reading/looking at phone causes the neck stiffness.     HA with neck stiffness--everyday.  Resolved 10/13/17   Rt side of neck/face feel swollen/stiff.  Resolved 10/13/17   Sit >1 hr and not able to standing >10 mincauses back pain.  Can stand up 15-20 min before back pain starts, can sit in recliner for couple of hrs.   Neck pain w/ carrying grocery bags.  Progressing 09/19/17   Can't walk >20 min max due to back pain.      PLAN:   Patient requires continued skilled intervention in order to decrease pain. and meet above mentioned functional goals.  Focus on therex, manual therapy for neck, back, strengthening exercise for neck and back.  Assess functional status and goal status next visit.  Increased focus on back exercises and back next visit.  Measure ROM.    Therapist Signature:    [name], PT, MPT [phone]  Outpatient Speciality Rehab  Physical Medicine and Rehabilitation  [facility]  P: [phone]     10/29/2017

## 2017-10-30 ENCOUNTER — Ambulatory Visit (INDEPENDENT_AMBULATORY_CARE_PROVIDER_SITE_OTHER): Payer: Self-pay | Admitting: Physician Assistant

## 2017-11-03 ENCOUNTER — Ambulatory Visit: Payer: Medicare Other

## 2017-11-03 DIAGNOSIS — M542 Cervicalgia: Secondary | ICD-10-CM

## 2017-11-03 DIAGNOSIS — M545 Low back pain, unspecified: Secondary | ICD-10-CM

## 2017-11-03 DIAGNOSIS — S32020A Wedge compression fracture of second lumbar vertebra, initial encounter for closed fracture: Secondary | ICD-10-CM

## 2017-11-03 DIAGNOSIS — G8929 Other chronic pain: Secondary | ICD-10-CM

## 2017-11-03 NOTE — Progress Notes (Signed)
[facility]  [address]  [place], [place]  [address]  Phone:  [phone]  Fax:  [phone]    PHYSICAL THERAPY DAILY TREATMENT NOTE    PATIENT: Leslie Gross DOB: April 28, 1951   MR #: [id number]  AGE: 67 y.o.    FACILITY PROVIDER #: [id number] PRIMARY MD: Lana Fish, MD    HICN# Medicare Sub. Num: [id number] DIAGNOSES: Neck pain [M54.2]; back pain      Date of Service PT Received On: 11/03/17   Treatment Time Start Time: 1305 to Stop Time: 1420   Time Calculation Time Calculation (min): 75 min   Visit # PT Visit  PT Visit Number: 16/35   Units Billed   Therapeutic Interventions  $ PT Therapeutic Exercise (97110): 2 Units  $ PT Manual Therapy (02725): 2 Unit     [name] referred for physical therapy services by: Sabino Snipes, NP    Certification period, precautions, medications and allergies and goals copied from initial evaluation - reviewed and reconciled today.  [name], PT 11/03/2017    Certification Period:  08/11/17 to 11/03/17    Treatment Diagnosis:  Neck Pain [M54.2], Painful Cervical ROM [M54.2], Chronic Lower Back Pain without Radiculopathy  [M54.5, G89.29, Generalized Muscle Weakness [M62.81] and cervicogenic headache R51    Date of onset: 05/30/17    Imaging Performed:  See imaging in Epic.    Precautions:   Fall Risk  Orthopedic Injury:  ankle fracture  Osteoporosis    Goals:  Short Term Goals: To be met by 8 visits  1Increase active cervical rotation to >50degrees for increased visual field to progress pt to besafe with changing lanes and backing up vehicle while driving.  Progressing 11/7  2Pt. will report >50% improvement in ability to sleep through night without report of interruptions due to pain.  Progressing 09/17/17  3Pt. will report decrease in frequency of headaches to less than 5times per week.  MET 10/15/17  4Improve NDI and Oswestryto <35% indicating improved fucntion and decreased pain.   MET 09/29/17  Long Term Goals: To be met by 24 visits  4Increase active cervical rotation to >60degrees for increased visual field to allow safe changing lanes and backing up vehicle while driving w/ no > 3/66 neck pain.  5Pt. will report ability to sleep through night without report of interruptions due to pain.  6 Increase strength of bilateral hips to at least 4/5to improve patients ability to walk >21minutes for grocery shopping, walking dog.  7 Pt will report >75% improvement in current functional status for improved quality of life.  8 Improve NDI and Oswestry to <20% indicating improved function and decreased pain.   Working Toward the Above Goals: (copied from last visit):    EXAMINATION:  Subjective Report: Pt reports that her neck and back is sore today.   She took pain meds this morning.  Pain:  3 neck at rest, back ache at rest w/ movement 3/10    Objective Findings today: Palpation: +TTP bil cervical erector spinae, bil UTs.    Outcome measure: not measured today.    INTERVENTION:  Treatment Performed:   MODALITIES:  MH to neck and bil UTs 15' post manual therapy; MH to low back while pt on Nu-step.  MANUAL THERAPY: STM/DTM to bil cervical erector spinae, bil UTs and upper thoracic erector spinae to increase ROM and decrease stiffness; gentle manual cervical traction to decrease guarding.  MWMs to increase  rot'n in supine pos'n.  THERAPEUTIC EXERCISES : 1:1 per exercise flow sheet to improve flexibility, ROM, strength, improve posture and decrease pain.  VCs and TCs to do the exercise correctly.    Exercise Specifics Date 10/15/17  Date 10/20/17 Date 10/22/17 11/03/17   UBE Fwd/retro Nu step w/ MH to back: 8' (UE/LE) Nu step w/ MH to back: LE only 8' seat 9 UBE: 3'/2' Nu step w/ MH to back: LE only 8' seat 9   Rows resisted RTB x12-2 - RTB x12-2 RTB x12-2   shldr ext bil RTB x12-2 - RTB x12-2 RTB x12-2   Cervical retraction supine 5 sec x10 - - 5 sec x10   Hip 3 ways  Hip 3 ways x10 Rt/Lt - x10 Rt/Lt  x10 Rt/Lt   Calf stretch Wedge 30" Calf stretch: wedge 30" x3 x3 x3 x3   HS stretch  HS stretch (passive): 30" x3 Rt/Lt 30'x2 Rt/Lt - -   Shoulder rolls    - x10   Lateral neck stretch 10" hold X2; levt scap x2  10" x2 10" x4 bil   rot'n stretch 10" hold Seated: 10" x1 Rt/Lt  10" x2 10" x5 bil   Lat pull downs resisted RTB x12-2 - GTB x10-2 GTB x10-2   Horiz abd Resisted sitting RTB x12-2  RTB x10-2 (sitting) RTB x10-2   SKC 10" x2 x2 - -   LTR Supine h/l x4  - -   Pelvic tilt " x4  - -   bridge Supine h/l x10  - -     Patient Education:   Patient was educated on importance of good posture, using MH on back and neck in the evening and stretching.    Patient did verbalize and demonstrate an understanding of this education.   Home Exercise Program was reviewed today.    EVALUATION AND DIAGNOSIS:   [name] is a 67 y.o. female presents to physical therapy today with dx for neck pain and s/p L2 fracture.    Pt w/ WNL supine cervial rot'n but not translating it into sitting--most likely due to forward head posture.  Post MH pt w/ increased ROM and decreased pain.  Pt w/ c/o Lt knee pain w/ Rt hip 3 way--pt needs to rest.    Pt will benefit from continuing w/ therex and supine and seated cervical exercises.     Impairments in body function and structure:  Decreased PROM  Decreased AROM  Decreased Strength   Pain  Postural Dysfunction    Activity Limitations / Restricted Participation:  Can't get a good night's rest due to neck and back pain.  Progressing 10/29/17   Not able to look over shoulder while driving due to neck pain and tightness.  Progressing 10/29/17   Pressure from neck goes up to top of head.  Resolved 10/13/17   Reading/looking at phone causes the neck stiffness.     HA with neck stiffness--everyday.  Resolved 10/13/17   Rt side of neck/face feel swollen/stiff.  Resolved 10/13/17   Sit >1 hr and not able to standing >10 mincauses back pain.  Can stand up 15-20 min before back pain starts, can sit in  recliner for couple of hrs.   Neck pain w/ carrying grocery bags.  Progressing 09/19/17   Can't walk >20 min max due to back pain.      PLAN:   Patient requires continued skilled intervention in order to decrease pain. and meet above mentioned functional goals.  Focus on therex, manual therapy for neck, back, strengthening exercise for neck and back.  Updated plan of care next visit.  Assess functional status and goal status next visit.  Increased focus on back exercises and back next visit.  Measure ROM.    Therapist Signature:    [name], PT, MPT [phone]  Outpatient Speciality Rehab  Physical Medicine and Rehabilitation  [facility]  P: [phone]     11/03/2017

## 2017-11-05 ENCOUNTER — Ambulatory Visit: Payer: Medicare Other

## 2017-11-05 DIAGNOSIS — G8929 Other chronic pain: Secondary | ICD-10-CM

## 2017-11-05 DIAGNOSIS — S32020A Wedge compression fracture of second lumbar vertebra, initial encounter for closed fracture: Secondary | ICD-10-CM

## 2017-11-05 DIAGNOSIS — M542 Cervicalgia: Secondary | ICD-10-CM

## 2017-11-05 DIAGNOSIS — G4486 Cervicogenic headache: Secondary | ICD-10-CM

## 2017-11-05 NOTE — Plan of Care (Cosign Needed)
[facility]  [address]  [place], [place]  [address]  Phone:  [phone]  Fax:  [phone]    PHYSICAL THERAPY DAILY TREATMENT and UPDATED PLAN OF CARE NOTE    Referred By: Sabino Snipes, NP    *I agree to the plan of care stated below*    _________________________________________________________________  Physician Signature                                                             Date      PATIENT: Leslie Gross DOB: January 28, 1951   MR #: [id number]  AGE: 67 y.o.    FACILITY PROVIDER #: [id number] PRIMARY MD: Lana Fish, MD    HICN# Medicare Sub. Num: [id number] DIAGNOSES: Neck pain [M54.2]; back pain      Date of Service PT Received On: 11/05/17   Treatment Time Start Time: 1304 to Stop Time: 1400   Time Calculation Time Calculation (min): 56 min   Visit # PT Visit  PT Visit Number: 6/30 (#17 total visits)   Units Billed   Therapeutic Interventions  $ PT Manual Therapy (97140): 2 Unit  $ PT Therapeutic Activity (97530): 2 units     Florean Fonnie Mu referred for physical therapy services by: Sabino Snipes, NP    Certification period, precautions, medications and allergies and goals copied from initial evaluation - reviewed and reconciled today.  [name], PT 11/05/2017    Certification Period: 08/11/17 to 11/03/17  Re-certification Period: 11/05/17- 01/28/18    Treatment Diagnosis:  Neck Pain [M54.2], Painful Cervical ROM [M54.2], Chronic Lower Back Pain without Radiculopathy  [M54.5, G89.29, Generalized Muscle Weakness [M62.81] and cervicogenic headache R51    Date of onset: 05/30/17    Imaging Performed:  See imaging in Epic.    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  Progressing 10/26/17 (discontinued)  1 Increase AROM of C-spine to >45 degrees for increased visual field to progress pt to besafe with changing lanes and backing up vehicle while driving.  2Pt. will report >50% improvement in ability to sleep through night without report of interruptions due to pain.  Progressing 11/05/17  3Pt. will report decrease in frequency of headaches to less than 5times per week.  MET 10/15/17  4Improve NDI and Oswestryto <35% indicating improved fucntion and decreased pain.  MET previously but NOT met today (discontinued).  4 Improve NDI and Oswestry to <42% indicating improved functional and decreased pain.    Long Term Goals: To be met by 24 visits  4Increase active cervical rotation to >50degrees for increased visual field to allow safe changing lanes and backing up vehicle while driving w/ no > 7/42 neck pain. (updated 11/05/17)  5Pt. will report ability to sleep through night without report of interruptions due to pain.  6 Increase strength of bilateral hips to at least 4/5to improve patients ability to walk >41minutes for grocery shopping, walking dog.  7 Pt will report >75% improvement in current functional status for improved quality of life.  8 Improve NDI and Oswestry to <30% indicating improved function and decreased pain. (updated goal)  Working Toward the Above Goals: (copied from last visit):    EXAMINATION:  Subjective Report: Pt reports that she didn't sleep at all last night due to back pain.  Pt's concerned about spouse's medical issues and not able to get comfortable at night.    Pain at eval:  4-5/10 neck pain; 2/10 pain.   Pain 11/05/2017: 3/10 neck pain; 4/10 back pain      Level of function at eval   11/05/2017     Can't get a good night's rest due to neck and back pain Can't sleep good at night due to back pain.   Not able to look over shoulder while driving due to neck pain and tightness. Now able to look over shoulder to  Lt side better, Rt is still hard.   Pressure from neck goes up to top of head. Resolved 10/13/17   Reading/looking at phone causes the neck stiffness. If looking down at phone in hand then stiffness happends   HA with neck stiffness--everyday. Resolved 10/13/17   Rt side of neck/face feel swollen/stiff. Resolved 10/13/17   Sit >1 hr and not able to standing >10 min causes back pain. No change since initial evaluation.   Neck pain w/ carrying grocery bags. Resolved 11/05/17   Can't walk >20 min max due to back pain. Can walk >20 min in grocery store but leans on the cart.     Objective Findings:     INITIAL EVAL   AROM neck  INITIAL EVAL   AROM L-spine Neck ROM 11/05/2017   L-AROM 11/05/2017     Flx: 26 deg w/ pulling, ext: 23 deg w/ pain Flxn: 3" below tibial tuberosity Flx: 36 deg w/ pulling, ext: 56 deg Flxn: 5" below tibial tuberosity w/ 4/10 back pain   SB Rt: 18 w/ p!;  Lt: 16 w/ p! SB: Rt: 4 in above joint line w/ p!; Lt 3.5" above joint line w/ p! In superior lumbar region SB Rt: 24 w/ p! 2/10;  Lt: 30 w/ p! 3/10. SB: Rt: 1" in above joint line w/ p! 3/10 across low back; Lt to knee joint line.   Rotation: Rt: 36 w/ p!; Lt: 32 w/ p! Rot'n: Rt 23 deg Lt 25 deg (supine hook lying) w/ 3-4/10 p! across low back. Rotation: Rt: 43 w/ tightness; Lt: 48 w/ tight Rot'n: Rt 60 deg 4/5 Lt side back pain; Lt 55 deg (supine hook lying) w/ pulling on Rt side   PROM: upper cervical rotation decreased by 50% bil; SB decreased by 50% bil w/ p! Rt side; rot'n decreased by >50% bil w/ neck pain. HS flexibility: 90/90 HS Rt (-) 35 deg, Lt (-) 38 deg (opposite knee flexed) PROM: upper cervical rot'n decreased by 50% bil; full rot'n and flexion. HS flexibility: 90/90 HS Rt (-) 30 deg, Lt (-) 42 deg (opposite knee straight)         INITIAL EVAL  STRENGTH  INITIAL EVAL    STRENGTH 11/05/2017  shoulder 11/05/2017  Hip   Shoulder flxn: Rt 3+/5 Lt 4-/5 Hip flxn: Rt: 4-/5; Lt: 4/5 bil w/ p! In lumbar spine (sittinge) Shoulder flxn: Rt 3+/5  Lt 4-/5 NT (due to lack of time)   Shoulder abd: Rt/Lt 3+/5 Hip abd: Rt/Lt 3/5 (standing) Shoulder abd: Rt/Lt 3+/5 NT  "  Hip ext: Rt/Lt 2/5 (standing--min past neutral)  NT"     Palpation at eval: -mod tightness in UTs and cervical erector spinae to T3; +TTP in UTs; muscle guarding preventing passive joint mobility in cervical spine. +TTP Rt SCM (SCM anterior border to from mastoid TTP about half way down with associated jaw pain). +TTP to bil occiput Rt>Lt;  11/05/2017 Palpation: +TTP bil occiput Rt>Lt; mod tightness in bil cervical erector spaine, bil UTs, bil SCM.  Mod tightness at bil L/S erector spinae, bil mid back and tender to touch around L1-L3 erector spinae.    Posture: forward head, rounded shoulders, upper cervical extension.    Outcomes:   INITIAL EVAL  11/05/2017     NDI: 44%      NDI: 40%   Oswestry: 48% Oswestry: 52%     INTERVENTION:  Treatment Performed:   MANUAL THERAPY: STM/DTM to bil cervical erector spinae, bil UTs and upper thoracic erector spinae to increase ROM and decrease stiffness; gentle manual cervical traction to decrease guarding.  MWMs to increase rot'n in supine pos'n.  STM to bil thoracic and L erector spinae in prone pos'n to decrease ms tightness and pain.  THERAPEUTIC ACTIVITY: Measurements completed (see above). NDI and Oswestry completed, reviewed with pt.  Pt education.    Exercise Specifics Date 10/15/17  Date 10/20/17 Date 10/22/17 11/03/17   UBE Fwd/retro Nu step w/ MH to back: 8' (UE/LE) Nu step w/ MH to back: LE only 8' seat 9 UBE: 3'/2' Nu step w/ MH to back: LE only 8' seat 9   Rows resisted RTB x12-2 - RTB x12-2 RTB x12-2   shldr ext bil RTB x12-2 - RTB x12-2 RTB x12-2   Cervical retraction supine 5 sec x10 - - 5 sec x10   Hip 3 ways  Hip 3 ways x10 Rt/Lt - x10 Rt/Lt x10 Rt/Lt   Calf stretch Wedge 30" Calf stretch: wedge 30" x3 x3 x3 x3   HS stretch  HS stretch (passive): 30" x3 Rt/Lt 30'x2 Rt/Lt - -   Shoulder rolls    - x10   Lateral neck stretch 10" hold X2; levt  scap x2  10" x2 10" x4 bil   rot'n stretch 10" hold Seated: 10" x1 Rt/Lt  10" x2 10" x5 bil   Lat pull downs resisted RTB x12-2 - GTB x10-2 GTB x10-2   Horiz abd Resisted sitting RTB x12-2  RTB x10-2 (sitting) RTB x10-2   SKC 10" x2 x2 - -   LTR Supine h/l x4  - -   Pelvic tilt " x4  - -   bridge Supine h/l x10  - -     Patient Education:   Patient was educated on importance of good posture, using MH on back and neck in the evening and stretching.    Patient did verbalize and demonstrate an understanding of this education.   Home Exercise Program was reviewed today.    EVALUATION AND DIAGNOSIS:   ENZLEY KITCHENS is a 67 y.o. female presents to physical therapy today with dx for neck pain and s/p L2 fracture.    Pt w/ WNL supine cervial rot'n but not translating it into sitting--most likely due to forward head posture and current stress from spouse's health issues.  Pt was improving until a few weeks ago but now her back pain is more and has less neck ROM pre PT.  Pt hasn't been compliant w/ LE strengthening exercises or low back/hip stretches that were given to  pt initially as she wanted to focus on her neck issues.  Pt understands the importance of continuing w/ HEP while she takes about a month off to be with spouse.  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  Pt hasn't done much L-spine movements (rot'n) since she came off TSO--months ago.   Pt's progress has been slow due to treatment of two different body parts and inability to treat both body parts in one treatment time every tx session.     Pt will benefit from continuing w/ therex for improving ROM of neck, back, increasing strength of core and decreasing pain to allow pt to do her normal functional activities w/o pain.  Pt may be returning to in 1 month --at the time an assessment will be made prior to initiating PT txs.  Pt needs to be 100% compliant w/ HEP routine to maintain and  progress w/ ROM of neck and low back as well as decreasing stiffness and pain.     Impairments in body function and structure:  Decreased PROM  Decreased AROM  Decreased Strength   Pain  Postural Dysfunction    Activity Limitations / Restricted Participation:  See current functional status above.    PLAN:   Patient requires continued skilled intervention in order to decrease pain. and meet above mentioned functional goals.  Focus on therex, manual therapy for neck, back, strengthening exercise for neck and back.      Therapist Signature:    [name], PT, MPT [phone]  Outpatient Speciality Rehab  Physical Medicine and Rehabilitation  [facility]  P: [phone]     11/05/2017

## 2017-11-10 ENCOUNTER — Ambulatory Visit: Payer: Medicare Other

## 2017-11-12 ENCOUNTER — Ambulatory Visit: Payer: Medicare Other

## 2017-11-24 ENCOUNTER — Ambulatory Visit
Admission: RE | Admit: 2017-11-24 | Discharge: 2017-11-24 | Disposition: A | Discharge status: Home / Self Care | Payer: Medicare Other | Source: Ambulatory Visit | Attending: Physician Assistant | Admitting: Physician Assistant

## 2017-11-24 DIAGNOSIS — I10 Essential (primary) hypertension: Secondary | ICD-10-CM | POA: Insufficient documentation

## 2017-11-24 LAB — BASIC METABOLIC PANEL
Anion Gap: 9 (ref 5.0–15.0)
BUN: 18.1 mg/dL (ref 7.0–19.0)
CO2: 27 mEq/L (ref 22–29)
Calcium: 9.3 mg/dL (ref 8.5–10.5)
Chloride: 109 mEq/L (ref 100–111)
Creatinine: 0.7 mg/dL (ref 0.6–1.0)
Glucose: 92 mg/dL (ref 70–100)
Potassium: 5 mEq/L (ref 3.5–5.1)
Sodium: 145 mEq/L (ref 136–145)

## 2017-11-24 LAB — GFR: EGFR: >60

## 2018-01-01 ENCOUNTER — Other Ambulatory Visit: Payer: Self-pay

## 2018-01-20 ENCOUNTER — Ambulatory Visit: Payer: Medicare Other | Attending: Nurse Practitioner

## 2018-01-20 DIAGNOSIS — M542 Cervicalgia: Secondary | ICD-10-CM | POA: Insufficient documentation

## 2018-01-20 DIAGNOSIS — M545 Low back pain: Secondary | ICD-10-CM | POA: Insufficient documentation

## 2018-01-20 DIAGNOSIS — R51 Headache: Secondary | ICD-10-CM | POA: Insufficient documentation

## 2018-01-20 DIAGNOSIS — G8929 Other chronic pain: Secondary | ICD-10-CM | POA: Insufficient documentation

## 2018-01-20 DIAGNOSIS — M6281 Muscle weakness (generalized): Secondary | ICD-10-CM | POA: Insufficient documentation

## 2018-01-20 DIAGNOSIS — M47812 Spondylosis without myelopathy or radiculopathy, cervical region: Secondary | ICD-10-CM

## 2018-01-20 DIAGNOSIS — M19012 Primary osteoarthritis, left shoulder: Secondary | ICD-10-CM

## 2018-01-20 DIAGNOSIS — M25512 Pain in left shoulder: Secondary | ICD-10-CM

## 2018-01-20 NOTE — PT Eval Note (Signed)
[facility]  [address]  [place], [place]  [address]  Phone:  [phone]  Fax:  [phone]    PHYSICAL THERAPY EVALUATION AND PLAN OF CARE           Referred By: Warden Fillers*    *I agree to the plan of care stated below*                                                                                                                                           Physician Signature      Date      PATIENT: Leslie Gross DOB: 09-Sep-1951   MR #: [id number]  AGE: 67 y.o.    FACILITY PROVIDER #: [id number] PRIMARY MD: Lana Fish, MD    HICN# Medicare Sub. Num: [id number] DIAGNOSES: Neck pain [M54.2];Right shoulder pain [M25.511];Left shoulder pain [M25.512] Neck OA, primary OA, L shoulder OA       Date of Service PT Received On: 01/20/18   Treatment Time Start Time: 1030 to Stop Time: 1130   Time Calculation Time Calculation (min): 60 min   Visit # PT Visit  PT Visit Number: 7/30   Units Billed PT Evaluation  $ PT Evaluation Low Complexity (97161): 1 Procedure (50 mins) Therapeutic Interventions  $ PT Therapeutic Exercise (97110): 1 Unit (10 mins)     Certification Period:  01/20/18 to 04/21/18    Treatment Diagnosis:  L shoulder pain  Cervical pain,   Decreased cervical range of motion    DOI=     Imaging Performed:  X rays per pt say "I have arthritis"    Precautions: osteoporosis, falls  Hx of B inferior shoulder dislocation ~34 years ago  Hx of R shoulder reverse shoulder replacement  Hx of lumbar L1-L3 = healed fracture (wore a TLSO= d/c from it)      Medications listed in EMR:  has a current medication list which includes the following prescription(s): amitriptyline, aspirin ec, calcium citrate-vitamin d, carvedilol, creon, cyanocobalamin, cyclobenzaprine, cyclobenzaprine, cyclosporine, ergocalciferol, escitalopram, fluticasone, ibuprofen, levothyroxine, mesalamine, multivitamin, multiple vitamins-minerals, ranitidine, and UNKNOWN TO  PATIENT.  Patient/Caregiver does not report changes to medication at this time.    Allergies:  Allergies   Allergen Reactions   . Shellfish-Derived Products Angioedema   . Diflucan [Fluconazole] Diarrhea   . Penicillins Nausea And Vomiting   . Tape Other (See Comments)     Causes blisters       EXAMINATION / EVALUATION:    Past Medical History:  Leslie Gross  has a past medical history of Anemia; Arrhythmia (Dg 2008); Blood transfusion without reported diagnosis (2012); Deep venous thrombosis of calf; Difficulty in walking(719.7); GERD (gastroesophageal reflux disease); Glaucoma; Headache(784.0); History of acute pancreatitis (2012); Hypothyroidism; Mitral valve prolapse; Osteoporosis; Pulmonary embolism; Sleep apnea; and Ulcerative colitis, chronic.    Past  Surgical History:  Leslie Gross  has a past surgical history that includes Pancreas surgery (2012); Dilation and curettage of uterus (2011); Cystostomy w/ bladder biopsy; Abdominal surgery (2006); Esophagogastroduodenoscopy (2011); Lung biopsy (1998); Ankle Fusion (1981); Knee surgery (1981); Breast surgery (5638-7564); Cesarean section (1990s); Cardiac catheterization (2009); Shoulder muscle transfer (1972); Tonsillectomy; Gastric bypass; EGD, DILATION (N/A, 12/06/2016); Colonoscopy (N/A, 12/06/2016); Fracture surgery; and Joint replacement (2012).        Social History:  Leslie Gross has a good social support system.  Leslie Gross reports adequate housing and no significant barriers to household mobility.  DME Owned:  None at this time    Patient presents for physical therapy today with spouse.     HISTORY:     Leslie Gross is a 67 y.o. female with hx of L shoulder pain a few months back. Onset: it started when she was doing her ex's in PT with her shoulder, The pain is catchy, deep, sharp with overhead activities. Pt sleeps on the R side at night. Sleeping on the L side is painful. On 12/16/17= pt had a cortisone shot of her L wrist (radial  side)    Patient Goal: reduce pain L shoulder    Subjective Report: I am doing ok. I have to help my husband, and my shoulder is killing me.    Pain:  5/10 L shoulder, deep sharp, catchy    Level of function:    Prior level of function Level of function at eval     I with ADL's, IADL's I with ADL's with pain   Pt is R hand dominant I with IADL's with pain  Cannot don and doff shirt  Cannot don bra    Primary caregiver for her husband's care     REVIEW OF SYSTEMS:  Communication:  Alert and oriented to person, place and time.  Emotional/Behavioral responses appear appropriate at this time.  Demonstrates ability to make needs known.    Cardiopulmonary: not taken today  Integumentary:  Unremarkable.       Musculoskeletal System:     INITIAL EVAL   PROM/AROM  INITIAL EVAL   PROM/AROM   RUE WFL  LUE PROM full but painful at end ranges with IR, ER AROM  Cervical flexion 45 deg  Extension 20 deg  Rot R 42 deg  Rot L 26 deg  Flex B 25 deg     LUE AROM painfree range  scaption plane 155 deg  ER 75 deg  IR 60 deg        LUE Functional IR GT region  Functional ER able to reach around with scaption plane ~ C7 region but painful               INITIAL EVAL  STRENGTH  INITIAL EVAL    STRENGTH   RUE grossly 3+/5  LUE flex, abd ~90 deg 3+/5  ER, IR in neutral 3+/5                        Circumferential Measurements: RIGHT    LEFT     None today                 SPECIAL TESTS    +speeds' LUE  +drop arm test  Negative neer's test  +O'Brien's test              Palpation:  Tender to palpation along bicipital tendon     Joint Mobility:  L  GH mobility hypomobile    Edema:  No swelling / edema noted.    Posture:  Forward head  Rounded shoulders  Thoracic kyphosis    Neuromuscular System:  Sensation:   Light touch intact throughout.    Reflexes:  not assessed today.    Coordination:  Not assessed today.    INITIAL EVAL    Functional Mobility and transfers:    Patient is independent with household and community mobility.   Balance:   Not  assessed today.   Gait:  Unremarkable.  no assistive device               Outcomes:   INITIAL EVAL    SPADI to be assessed.             Patient Education:   Patient was educated on goals and benefits of therapy, as well as perform activities in the pain free ranges only. Use of MH or ice, rest the shoulder, ask for help.  Pt. did demonstrate an understanding of this education.  Home Exercise Program was not addressed today.    Treatment Today:  Evaluation and Patient education.    EVALUATION AND DIAGNOSIS:   Leslie Gross is a 67 y.o. female presents to physical therapy with hx of falls, osteoporosis,  recent onset of L shoulder pain , cervical pain (chronic hx ) presents with pain to L shoulder, cervical tightness with decreased range of motion,  L shoulder decreased range of motion however is [facility]. Pt however is not able to don her bra, reach overhead, don and doff clothes secondary to pain. Pt presents with fair+ m.strength. Pt will benefit from skilled PT to improve with L shoulder pain, cervical pain, tightness to be able to be functional. Recommend aquatics. Pt is aware of the plan and agrees. Recommend MD follow up if no improvement noted. Pt agrees.    Impairments in body function and structure:  Decreased AROM  Decreased Strength   Pain    Activity Limitations / Restricted Participation:        I with ADL's with pain   I with IADL's with pain  Cannot don and doff shirt  Cannot don bra   Primary caregiver for her husband's care         PROGNOSIS:  Good for goals.    Without skilled intervention, patient may not be able to:  Perform light household chores safely and independently without risk for fall or injury.  Participate in safe and independent ADL's such as bathing, dressing, grooming.  Participate in safe and independent community ambulation for IADL's such as grocery shopping and preparing meals.  Take care of her spouse    Goals:  Short Term Goals:  To be met by 02/22/18  1.Pt to improve with L  shoulder pain to 3/10 to be able to don her shirt.  2. Pt to improve with SPADI by 10 points to be able to show significant improvement with function.  3.Pt to be I with HEP for L shoulder isometrics, isotonics in the pain free ranges only, scapulothoracic strengthening as tolerated, gentle cervical stretches, isometrics, proper body mechanics, posture.  Long Term Goals:  To be met by 7/919  1.Pt to be able to don her bra with < 3/10 shoulder pain.  2. Pt to improve with m.strength as noted with her ability to help her husband as a primary care giver.   3.Pt to improve with cervical ROM with improved function as noted with NDI by  10 points.  PLAN:  97530 Therapeutic Activities, to include functional mobility training.  [address] Therapeutic Exercises, to include home exercise program.  [phone] Neuromuscular Re-education, to include balance training.   [id number]  Aquatic Therapy   97140 Manual Therapy  Modalities: 97014 Unattended Estim  97035 Ultrasound NMES for RC musculature strengthening prn.  Assess SPADI, NDI  Frequency of treatment:    2 times per week for 12 weeks.    It has been a pleasure to evaluate Leslie Gross.  Please contact me with any questions or concerns regarding this patient's evaluation or ongoing therapy.     Therapist Signature:    Louretta Shorten, MSPT at [facility]  Please feel free to call me at [phone] if have any questions.      01/20/2018      CPT Evaluation Code Justification  CRITERIA JUSTIFICATION DESCRIPTION   Personal factors and/or co-morbidities that impact the plan of care. Factors that affect plan of care include:  Anemia, anxiety, arthritis, blood clot, broken bones, falls, heart disease, migraines, osteoporosis, vision problems 3 (High Complexity)     Body Systems Examined Impairments noted in:  Musculoskeletal 1-2 elements (Low Complexity)     Clinical Presentation Presentation stable, varies. Evolving (Moderate Complexity)     Clinical Decision Making Standardized  Assessment used:  See below for details. Evaluation Code:  Low Complexity

## 2018-01-22 ENCOUNTER — Ambulatory Visit: Payer: Medicare Other

## 2018-01-22 DIAGNOSIS — M47812 Spondylosis without myelopathy or radiculopathy, cervical region: Secondary | ICD-10-CM

## 2018-01-22 DIAGNOSIS — M25512 Pain in left shoulder: Secondary | ICD-10-CM

## 2018-01-22 NOTE — Progress Notes (Signed)
[facility]  [address]  [place], [place]  [address]  Phone:  [phone]  Fax:  [phone]    PHYSICAL THERAPY DAILY TREATMENT NOTE    PATIENT: Leslie Gross DOB: 04-03-1951   MR #: [id number]  AGE: 67 y.o.    FACILITY PROVIDER #: [id number] PRIMARY MD: Lana Fish, MD    HICN# Medicare Sub. Num: [id number] DIAGNOSES: Neck pain [M54.2];Right shoulder pain [M25.511];Left shoulder pain [M25.512]      Date of Service PT Received On: 01/22/18   Treatment Time Start Time: 1030 to Stop Time: 1120   Time Calculation Time Calculation (min): 50 min   Visit # PT Visit  PT Visit Number: 8/30   Units Billed   Therapeutic Interventions  $ PT Electrical Stimulation-Unattended (02725): 1 Procedure (12 mins)  $ PT Ultrasound (36644): 1 Units (8 mins)  $ PT Therapeutic Exercise (97110): 1 Unit (20 mins)  $ PT Manual Therapy (97140): 1 Unit (12 mins)     Leslie Gross referred for physical therapy services by: Warden Fillers*    Certification period, precautions, medications and allergies and goals copied from initial evaluation - reviewed and reconciled today.  Leslie Gross, PT 01/22/2018    Certification Period:  01/20/18 to 04/21/18    Treatment Diagnosis:  L shoulder pain  Cervical pain,   Decreased cervical range of motion    DOI=     Imaging Performed:  X rays per pt say "I have arthritis"    Precautions: osteoporosis, falls  Hx of B inferior shoulder dislocation ~34 years ago  Hx of R shoulder reverse shoulder replacement  Hx of lumbar L1-L3 = healed fracture (wore a TLSO= d/c from it)      Medications listed in EMR:  has a current medication list which includes the following prescription(s): amitriptyline, aspirin ec, calcium citrate-vitamin d, carvedilol, creon, cyanocobalamin, cyclobenzaprine, cyclobenzaprine, cyclosporine, ergocalciferol, escitalopram, fluticasone, ibuprofen, levothyroxine, mesalamine, multivitamin, multiple  vitamins-minerals, ranitidine, and UNKNOWN TO PATIENT.  Patient/Caregiver does not report changes to medication at this time.    Allergies:        Allergies   Allergen Reactions   . Shellfish-Derived Products Angioedema   . Diflucan [Fluconazole] Diarrhea   . Penicillins Nausea And Vomiting   . Tape Other (See Comments)     Causes blisters            Working Toward the Above Goals: (copied from last visit):    Short Term Goals:  To be met by 02/22/18  1.Pt to improve with L shoulder pain to 3/10 to be able to don her shirt.  2. Pt to improve with SPADI by 10 points to be able to show significant improvement with function.  3.Pt to be I with HEP for L shoulder isometrics, isotonics in the pain free ranges only, scapulothoracic strengthening as tolerated, gentle cervical stretches, isometrics, proper body mechanics, posture.  Long Term Goals:  To be met by 7/919  1.Pt to be able to don her bra with < 3/10 shoulder pain.  2. Pt to improve with m.strength as noted with her ability to help her husband as a primary care giver.   3.Pt to improve with cervical ROM with improved function as noted with NDI by 10 points.          GOAL#  STG Progress Toward Goals   1 Progressing   2 Progressing   3 Progressing  EXAMINATION:  Subjective Report: I woke up with pain yesterday morning, I may have slept on it LUE.    Pain:    5/10 LUE  Reported feeling better LUE upon leaving.    Objective Findings today: bicipital tendon tender LUE  Tight upper traps, levator= compensated range    Special Tests:  None today    INTERVENTION:  Treatment Performed:  MANUAL THERAPY Soft tissue mobilization to L upper trap, biceps region in order to restore movement in muscles , facilitate muscle relaxation.  THERAPEUTIC EXERCISES  1:1 per exercise flowsheet (see below) in order to promote safe progression of functional activities, increase strength.  Provided instruction in proper form and position to safely perform exercise, verbal  cues not to hold breath during exercise , instruction in maintaining a gentle stretch (and avoiding pain), instruction to keep ROM pain free .  MODALITIES  In order to Decrease pain  Pre-Modulated to L shoulder GH region  for 12  minutes with MHP to same supine with arm supported  CUS 1.0w/cm2x8 mins L shoulder supine, scaption plane supported, 1 mhz    Exercise Specifics Date Date Date Date Date Date   Kinometrics Assessment  01/22/18        Isometrics  Neutral  LUE ER, IR, abd X 10        Seated upper trap stretch B 1x30 secs X held                                                                                                                                                                                  Patient Education:   Patient was educated on rest LUE, use of MH, ice, perform her HEP in the pain free ranges only, avoid activities that aggravate pain. Posture.  Patient did verbalize and demonstrate an understanding of this education.   Home Exercise Program was issued today.[name]    EVALUATION AND DIAGNOSIS:   Leslie Gross is a 67 y.o. female presents to physical therapy today with LUE pain which felt better at the end of the session. Pt to cont to work towards decreasing pain, improve function with LUE, cervical region as tolerated only.    Impairments in body function and structure:  Decreased AROM  Decreased Strength   Pain    Activity Limitations / Restricted Participation:          I with IADL's with pain  Cannot don and doff shirt  Cannot don bra     Primary caregiver for her husband's care                 PLAN:   Patient requires continued skilled intervention in order to  increase patient safety and independence with daily activities., decrease pain. and meet above mentioned functional goals.  Focus on decreasing L shoulder pain, improve function within tolerance only, follow up with last visit next visit.    Therapist Signature:    Leslie Gross, MSPT at [facility]  Please feel free to  call me at [phone] if have any questions.      01/22/2018

## 2018-01-23 DIAGNOSIS — M47812 Spondylosis without myelopathy or radiculopathy, cervical region: Secondary | ICD-10-CM

## 2018-01-23 HISTORY — DX: Spondylosis without myelopathy or radiculopathy, cervical region: M47.812

## 2018-01-27 ENCOUNTER — Ambulatory Visit: Payer: Medicare Other

## 2018-01-27 DIAGNOSIS — M25512 Pain in left shoulder: Secondary | ICD-10-CM

## 2018-01-27 DIAGNOSIS — M47812 Spondylosis without myelopathy or radiculopathy, cervical region: Secondary | ICD-10-CM

## 2018-01-27 NOTE — Progress Notes (Signed)
[facility]  [address], [address]  [place], [place]  [address]  Phone:  [phone]  Fax:  [phone]    PHYSICAL THERAPY DAILY TREATMENT NOTE    PATIENT: Leslie Gross DOB: December 14, 1950   MR #: [id number]  AGE: 67 y.o.    FACILITY PROVIDER #: [id number] PRIMARY MD: Lana Fish, MD    HICN# Medicare Sub. Num: [id number] DIAGNOSES: Neck pain [M54.2];Right shoulder pain [M25.511];Left shoulder pain [M25.512]      Date of Service PT Received On: 01/27/18   Treatment Time Start Time: 1030 to Stop Time: 1120   Time Calculation Time Calculation (min): 50 min   Visit # PT Visit  PT Visit Number: 9/30   Units Billed   Therapeutic Interventions  $ PT Electrical Stimulation-Unattended (40347): 1 Procedure (10 mins)  $ PT Ultrasound (42595): 1 Units (8 mins)  $ PT Therapeutic Exercise (97110): 1 Unit (12 mins)  $ PT Manual Therapy (97140): 1 Unit (20 mins)     [facility] Fonnie Mu referred for physical therapy services by: Warden Fillers*    Certification period, precautions, medications and allergies and goals copied from initial evaluation - reviewed and reconciled today.  Leslie Gross, PT 01/27/2018    Certification Period:  01/20/18 to 04/21/18    Treatment Diagnosis:  L shoulder pain  Cervical pain,   Decreased cervical range of motion    DOI=     Imaging Performed:  X rays per pt say "I have arthritis"    Precautions: osteoporosis, falls  Hx of B inferior shoulder dislocation ~34 years ago  Hx of R shoulder reverse shoulder replacement  Hx of lumbar L1-L3 = healed fracture (wore a TLSO= d/c from it)      Medications listed in EMR:  has a current medication list which includes the following prescription(s): amitriptyline, aspirin ec, calcium citrate-vitamin d, carvedilol, creon, cyanocobalamin, cyclobenzaprine, cyclobenzaprine, cyclosporine, ergocalciferol, escitalopram, fluticasone, ibuprofen, levothyroxine, mesalamine, multivitamin, multiple  vitamins-minerals, ranitidine, and UNKNOWN TO PATIENT.  Patient/Caregiver does not report changes to medication at this time.    Allergies:        Allergies   Allergen Reactions   . Shellfish-Derived Products Angioedema   . Diflucan [Fluconazole] Diarrhea   . Penicillins Nausea And Vomiting   . Tape Other (See Comments)     Causes blisters            Working Toward the Above Goals: (copied from last visit):    Short Term Goals:  To be met by 02/22/18  1.Pt to improve with L shoulder pain to 3/10 to be able to don her shirt.  2. Pt to improve with SPADI by 10 points to be able to show significant improvement with function.  3.Pt to be I with HEP for L shoulder isometrics, isotonics in the pain free ranges only, scapulothoracic strengthening as tolerated, gentle cervical stretches, isometrics, proper body mechanics, posture.  Long Term Goals:  To be met by 7/919  1.Pt to be able to don her bra with < 3/10 shoulder pain.  2. Pt to improve with m.strength as noted with her ability to help her husband as a primary care giver.   3.Pt to improve with cervical ROM with improved function as noted with NDI by 10 points.          GOAL#  STG Progress Toward Goals   1 Progressing   2 Progressing   3 Progressing  EXAMINATION:  Subjective Report: I felt good for two days after last time. The pain is back it hurts at night and when I wake up.    Pain:    3/10 LUE  Reported feeling better LUE upon leaving.    Objective Findings today: bicipital tendon tender LUE persists  Tight upper traps, levator= compensated range    Special Tests:  None today    INTERVENTION:  Treatment Performed:  MANUAL THERAPY Soft tissue mobilization to L upper trap, biceps region in order to restore movement in muscles , facilitate muscle relaxation.  THERAPEUTIC EXERCISES  1:1 per exercise flowsheet (see below) in order to promote safe progression of functional activities, increase strength.  Provided instruction in proper form and  position to safely perform exercise, verbal cues not to hold breath during exercise , instruction in maintaining a gentle stretch (and avoiding pain), instruction to keep ROM pain free .  MODALITIES  In order to Decrease pain  Pre-Modulated to L shoulder GH region  for 12  minutes with MHP to same supine with arm supported  CUS 1.0w/cm2x8 mins L shoulder supine, scaption plane supported, 1 mhz    Exercise Specifics Date Date Date Date Date Date   Kinometrics Assessment  01/22/18 01/27/18       Isometrics  Neutral  LUE ER, IR, abd X 10 xER only today       Seated upper trap stretch B 1x30 secs X held        Supine  AAROM protraction  X 10        ER B finning  X 10                                                                                                                                                             Patient Education:   Patient was educated on rest LUE, use of MH, ice, perform her HEP in the pain free ranges only, avoid activities that aggravate pain. Posture.  No overhead activities, no IR, extension.  Patient did verbalize and demonstrate an understanding of this education.   Home Exercise Program was issued today.[name]    EVALUATION AND DIAGNOSIS:   Leslie Gross is a 67 y.o. female presents to physical therapy today with LUE pain at night. Recommend cont to work towards maintenance of ROM, strength in the pain free ranges only, follow up with MD.  Impairments in body function and structure:  Decreased AROM  Decreased Strength   Pain    Activity Limitations / Restricted Participation:          I with IADL's with pain  Cannot don and doff shirt/pants  Cannot don bra     Primary caregiver for her husband's care  PLAN:   Patient requires continued skilled intervention in order to increase patient safety and independence with daily activities., decrease pain. and meet above mentioned functional goals.  Focus on decreasing L shoulder pain, improve function within tolerance only, follow up  with last visit next visit.    Therapist Signature:    Leslie Gross, MSPT at [facility]  Please feel free to call me at [phone] if have any questions.      01/27/2018

## 2018-01-29 ENCOUNTER — Ambulatory Visit: Payer: Medicare Other

## 2018-01-30 ENCOUNTER — Other Ambulatory Visit: Payer: Self-pay

## 2018-02-03 ENCOUNTER — Ambulatory Visit: Payer: Medicare Other

## 2018-02-05 ENCOUNTER — Ambulatory Visit: Payer: Medicare Other

## 2018-02-05 NOTE — Discharge Summary (Signed)
[facility]  [address] [place] [address]  Phone: [phone]      Fax: [phone]    PHYSICAL THERAPY DISCHARGE SUMMARY      PATIENT: Leslie Gross DOB: 02-14-1951   MR #: [id number]  AGE: 67 y.o.    FACILITY PROVIDER #: [id number] PRIMARY MD: Lana Fish, MD    HICN# Medicare Sub. Num: [id number] TREATMENT DIAGNOSES: Neck pain [M54.2];Right shoulder pain [M25.511];Left shoulder pain [M25.512] Neck OA, primary OA, L shoulder OA   DATES OF SERVICE: 01/20/18-01/27/18= 3 visits     DIAGNOSIS:  Neck pain [M54.2];Right shoulder pain [M25.511];Left shoulder pain [M25.512]          LEVEL OF FUNCTION:  INITIAL EVAL CURRENT       Pt is R hand dominant I with IADL's with pain  Cannot don and doff shirt  Cannot don bra    Primary caregiver for her husband's care      No significant changes with pain with function                     GOAL STATUS:   Current goals (copied from last treatment note):  Short Term Goals:  To be met by 02/22/18  1.Pt to improve with L shoulder pain to 3/10 to be able to don her shirt.  2. Pt to improve with SPADI by 10 points to be able to show significant improvement with function.  3.Pt to be I with HEP for L shoulder isometrics, isotonics in the pain free ranges only, scapulothoracic strengthening as tolerated, gentle cervical stretches, isometrics, proper body mechanics, posture.  Long Term Goals:  To be met by 7/919  1.Pt to be able to don her bra with < 3/10 shoulder pain.  2. Pt to improve with m.strength as noted with her ability to help her husband as a primary care giver.   3.Pt to improve with cervical ROM with improved function as noted with NDI by 10 points.     STG's STATUS   1 progressing    2 NOT ADDRESSED TODAY.    3 progressing    4    5       LTG's STATUS   1     2     3     4    5       ASSESSMENT:  Leslie Gross is a 67 y.o. female presenting to PHYSICAL THERAPY with   LUE pain at night. No significant  change noted with pain during function. MD follow up recommended to hold PT at this time, pt has a biceps tear. D/c without final visit.         PLAN:  DISCHARGE FROM THERAPY AT THIS TIME SECONDARY TO MD recommendation.    It has been a pleasure to work with [name].  Please contact me with any questions or concerns regarding your patients care.  Thank you,     Louretta Shorten, MSPT at [facility]  Please feel free to call me at [phone] if have any questions.      02/05/2018

## 2018-02-10 ENCOUNTER — Ambulatory Visit: Payer: Medicare Other

## 2018-02-17 ENCOUNTER — Ambulatory Visit: Payer: Medicare Other

## 2018-02-19 ENCOUNTER — Ambulatory Visit: Payer: Medicare Other

## 2018-02-24 ENCOUNTER — Ambulatory Visit: Payer: Medicare Other

## 2018-02-26 ENCOUNTER — Ambulatory Visit: Payer: Medicare Other

## 2018-03-03 ENCOUNTER — Ambulatory Visit: Payer: Medicare Other

## 2018-03-05 ENCOUNTER — Ambulatory Visit: Payer: Medicare Other

## 2018-03-10 ENCOUNTER — Ambulatory Visit: Payer: Medicare Other

## 2018-03-12 ENCOUNTER — Ambulatory Visit: Payer: Medicare Other

## 2018-03-17 ENCOUNTER — Ambulatory Visit: Payer: Medicare Other

## 2018-03-19 ENCOUNTER — Ambulatory Visit: Payer: Medicare Other

## 2018-03-24 ENCOUNTER — Ambulatory Visit: Payer: Medicare Other

## 2018-03-26 ENCOUNTER — Ambulatory Visit: Payer: Medicare Other

## 2018-03-31 ENCOUNTER — Ambulatory Visit: Payer: Medicare Other

## 2018-04-02 ENCOUNTER — Ambulatory Visit: Payer: Medicare Other

## 2018-04-07 ENCOUNTER — Ambulatory Visit: Payer: Medicare Other

## 2018-04-09 ENCOUNTER — Ambulatory Visit: Payer: Medicare Other

## 2018-05-13 ENCOUNTER — Ambulatory Visit (INDEPENDENT_AMBULATORY_CARE_PROVIDER_SITE_OTHER): Payer: Self-pay | Admitting: Cardiology

## 2018-05-13 DIAGNOSIS — I420 Dilated cardiomyopathy: Secondary | ICD-10-CM | POA: Insufficient documentation

## 2018-05-13 DIAGNOSIS — Z01810 Encounter for preprocedural cardiovascular examination: Secondary | ICD-10-CM | POA: Insufficient documentation

## 2018-05-13 HISTORY — DX: Dilated cardiomyopathy: I42.0

## 2018-06-29 ENCOUNTER — Other Ambulatory Visit: Payer: Self-pay | Admitting: Cardiology

## 2018-06-29 DIAGNOSIS — I429 Cardiomyopathy, unspecified: Secondary | ICD-10-CM

## 2018-06-29 DIAGNOSIS — I420 Dilated cardiomyopathy: Secondary | ICD-10-CM

## 2018-07-02 ENCOUNTER — Other Ambulatory Visit (INDEPENDENT_AMBULATORY_CARE_PROVIDER_SITE_OTHER): Payer: Self-pay

## 2018-07-02 ENCOUNTER — Ambulatory Visit
Admission: RE | Admit: 2018-07-02 | Discharge: 2018-07-02 | Disposition: A | Discharge status: Home / Self Care | Payer: Medicare Other | Source: Ambulatory Visit | Attending: Cardiology | Admitting: Cardiology

## 2018-07-02 ENCOUNTER — Ambulatory Visit
Admission: RE | Admit: 2018-07-02 | Discharge: 2018-07-02 | Disposition: A | Discharge status: Home / Self Care | Payer: Medicare Other | Source: Ambulatory Visit | Attending: Rheumatology | Admitting: Rheumatology

## 2018-07-02 ENCOUNTER — Other Ambulatory Visit: Payer: Self-pay | Admitting: Rheumatology

## 2018-07-02 ENCOUNTER — Other Ambulatory Visit: Payer: Self-pay | Admitting: Family

## 2018-07-02 DIAGNOSIS — K5190 Ulcerative colitis, unspecified, without complications: Secondary | ICD-10-CM | POA: Insufficient documentation

## 2018-07-02 DIAGNOSIS — M25539 Pain in unspecified wrist: Secondary | ICD-10-CM

## 2018-07-02 DIAGNOSIS — M19032 Primary osteoarthritis, left wrist: Secondary | ICD-10-CM | POA: Insufficient documentation

## 2018-07-02 DIAGNOSIS — R1110 Vomiting, unspecified: Secondary | ICD-10-CM

## 2018-07-02 DIAGNOSIS — I420 Dilated cardiomyopathy: Secondary | ICD-10-CM | POA: Insufficient documentation

## 2018-07-02 DIAGNOSIS — M2550 Pain in unspecified joint: Secondary | ICD-10-CM | POA: Insufficient documentation

## 2018-07-02 DIAGNOSIS — I080 Rheumatic disorders of both mitral and aortic valves: Secondary | ICD-10-CM | POA: Insufficient documentation

## 2018-07-02 DIAGNOSIS — R011 Cardiac murmur, unspecified: Secondary | ICD-10-CM | POA: Insufficient documentation

## 2018-07-02 DIAGNOSIS — I5189 Other ill-defined heart diseases: Secondary | ICD-10-CM | POA: Insufficient documentation

## 2018-07-02 DIAGNOSIS — M19042 Primary osteoarthritis, left hand: Secondary | ICD-10-CM | POA: Insufficient documentation

## 2018-07-02 DIAGNOSIS — R198 Other specified symptoms and signs involving the digestive system and abdomen: Secondary | ICD-10-CM

## 2018-07-02 DIAGNOSIS — M85832 Other specified disorders of bone density and structure, left forearm: Secondary | ICD-10-CM | POA: Insufficient documentation

## 2018-07-02 LAB — RHEUMATOID FACTOR: Rheumatoid Factor: <15 (ref 0.0–30.0)

## 2018-07-02 LAB — T4, FREE: T4 Free: 1.35 ng/dL (ref 0.70–1.48)

## 2018-07-02 LAB — C4 COMPLEMENT: C4 Complement: 27.8 mg/dL (ref 15.0–57.0)

## 2018-07-02 LAB — CK: Creatine Kinase (CK): 26 U/L — ABNORMAL LOW (ref 29–168)

## 2018-07-02 LAB — C-REACTIVE PROTEIN: C-Reactive Protein: 0.5 mg/dL (ref 0.0–0.8)

## 2018-07-02 LAB — HEMOLYSIS INDEX: Hemolysis Index: 7 (ref 0–18)

## 2018-07-02 LAB — T3: T3: 0.6 ng/mL (ref 0.48–1.59)

## 2018-07-02 LAB — C3 COMPLEMENT: C3 Complement: 95 mg/dL (ref 83–193)

## 2018-07-02 LAB — IGA: Immunoglobulin A: 306 mg/dL (ref 69–517)

## 2018-07-02 LAB — SEDIMENTATION RATE: Sed Rate: 2 mm/Hr (ref 0–20)

## 2018-07-02 LAB — IGG: Immunoglobulin G: 1162 mg/dL (ref 540–1822)

## 2018-07-02 LAB — IGM: Immunoglobulin M: 82 mg/dL (ref 22–293)

## 2018-07-03 ENCOUNTER — Ambulatory Visit
Admission: RE | Admit: 2018-07-03 | Discharge: 2018-07-03 | Disposition: A | Discharge status: Home / Self Care | Payer: Medicare Other | Source: Ambulatory Visit | Attending: Rheumatology | Admitting: Rheumatology

## 2018-07-03 ENCOUNTER — Ambulatory Visit (INDEPENDENT_AMBULATORY_CARE_PROVIDER_SITE_OTHER): Payer: Self-pay

## 2018-07-03 DIAGNOSIS — M2550 Pain in unspecified joint: Secondary | ICD-10-CM | POA: Insufficient documentation

## 2018-07-03 LAB — ANTI-RIBONUCLEIC ACID ANTIBODY
ANA RNP: 43 (ref 0–99)
Ribonucleoprotein (RNP) Antibody Interpretation: NEGATIVE

## 2018-07-03 LAB — ANTI-DNA ANTIBODY, DOUBLE-STRANDED
Anti-DNA (DS) Antibody Quantitative: 21 (ref 0–99)
dsDNA Interpretation: NEGATIVE

## 2018-07-03 LAB — SJOGRENS SYNDROME-A EXTRACTABLE NUCLEAR ANTIBODY(SOFT)
SSA Interpretation: NEGATIVE
Sjogrens SSA (Ro) Antibody: 70 (ref 0–99)

## 2018-07-03 LAB — HEPATITIS PANEL, ACUTE
Hep A IgM: NONREACTIVE
Hepatitis B Core IgM: NONREACTIVE
Hepatitis B Surface Antigen: NONREACTIVE
Hepatitis C, AB: NONREACTIVE

## 2018-07-03 LAB — SCLERODERMA (SCL-70) ANTIBODY
Scleroderma (Scl-70) Antibody Interpretation: NEGATIVE
Scleroderma SCL-70: 33 (ref 0–99)

## 2018-07-03 LAB — SJOGRENS SYNDROME-B EXTRACTABLE NUCLEAR ANTIBODY
Sjogrens SSB (La) Antibody Interpretation: NEGATIVE
Sjogrens SSB (La) Antibody: 9 (ref 0–99)

## 2018-07-03 LAB — CENTROMERE AB, IGG, S: Centromere AB, IgG: <0.2 U

## 2018-07-03 LAB — LYME AB, TOTAL,REFLEX TO WESTERN BLOT (IGG & IGM): Lyme AB,Total,Reflx to WB(IGM): 0.07 (ref 0.00–0.90)

## 2018-07-03 LAB — ANA SCREEN ONLY: ANA Screen: NEGATIVE

## 2018-07-04 LAB — CARDIOLIPIN AB (IGA, IGG, IGM (SOFT)
Cardiolipin IgA Antibody: <11 (ref <=11)
Cardiolipin IgG Antibody: <14 (ref <=14)
Cardiolipin IgM Antibody: <12 (ref <=12)

## 2018-07-05 LAB — QUESTASSURED(TM)25-OH VIT D(D2,D3), LC/MS/MS)>3YR)
Vitamin D 25-OH D2: <4 ng/mL
Vitamin D 25-OH D3: 28 ng/mL
Vitamin D, 25-OH, Total: 28 ng/mL — ABNORMAL LOW (ref 30–100)

## 2018-07-05 LAB — MISCELLANEOUS QUEST TEST

## 2018-07-06 LAB — HISTONE
Histone Interpretation: NEGATIVE
Histone: 16 (ref 0–99)

## 2018-07-06 LAB — THYROID PEROXIDASE AND THYROGLOBULIN AB
Anti-thyroid peroxidase AB: 1 (ref <9)
Thyroglobulin AB: <1 (ref <=1)

## 2018-07-06 LAB — CYCLIC CITRUL PEPTIDE ANTIBODY, IGG: CCP, Antibody IgG: <16 (ref <20)

## 2018-07-06 LAB — HLA-B27 ANTIGEN: HLA-B27 Antigen: NEGATIVE

## 2018-07-06 LAB — MISCELLANEOUS QUEST TEST

## 2018-07-09 LAB — MISCELLANEOUS QUEST TEST

## 2018-07-09 LAB — SACCHAROMYCES CEREVISIAE ANTIBODIES, IGG AND IGA
ASCA, IgA: 17.6 U (ref <=20.0)
ASCA, IgG: 21.9 U — ABNORMAL HIGH (ref <=20.0)

## 2018-07-09 LAB — ANCA SCREEN W/REFLEX TO TITER (SOFT): ANCA Screen: NEGATIVE

## 2018-07-21 ENCOUNTER — Ambulatory Visit: Admission: RE | Admit: 2018-07-21 | Payer: Medicare Other | Source: Ambulatory Visit | Admitting: Gastroenterology

## 2018-07-21 ENCOUNTER — Encounter: Admission: RE | Payer: Self-pay | Source: Ambulatory Visit

## 2018-07-21 ENCOUNTER — Encounter: Payer: Self-pay | Admitting: Gastroenterology

## 2018-07-21 SURGERY — DONT USE, USE 1095-ESOPHAGOGASTRODUODENOSCOPY (EGD), DIAGNOSTIC
Anesthesia: Monitor Anesthesia Care | Site: Abdomen

## 2018-07-27 ENCOUNTER — Telehealth: Payer: Medicare Other

## 2018-07-27 NOTE — Pre-Procedure Instructions (Signed)
PSSRNPATIENTINSTRUCTIONSADULT    Date of Procedure _OCT 17___.    Arrival Time is _1.5__ hours prior to surgical time which is currently scheduled for _2:20 PM ARRIVE 1 PM___.  Preop will call you after 4pm the business day prior to your scheduled procedure to verify this time, time can change up until this time.    Eating and Drinking Instructions for day of surgery-  If time changes- clear liquids 4 hours prior to arrival time.  CLEAR LIQUIDS INCLUDE:WATER, BLACK COFFEE/TEA (NO MILK, CREAM, NON-DAIRY CREAMER OR SUGAR), CARBONATED BEVERAGES, JUICES WITHOUT PULP (APPLE, ), GATORADE    NPO(NO SOLIDS) PER SURGEONS INSTRUCTIONS, CLEAR FLUIDS TILL 0900 OCT 17    Special Medication Instructions from Anesthesiologist     NO HERBAL TEA, ANTI INFLAMMATORY IE ASA, ADVIL 1 WEEK PRE SURGERY    acetaminophen (TYLENOL) 500 MG tablet  Take 1,000 mg by mouth 2 (two) times daily    Last Dose:Taking  Note written 07/27/2018 1029: MAY TAKE MORNING OF SURGERY    amitriptyline (ELAVIL) 25 MG tablet  Take 25 mg by mouth nightly    Last Dose:Taking  Note written 07/27/2018 1013: TAKE EVENING PRE SURGERY     aspirin EC 81 MG EC tablet  Take 81 mg by mouth every morning    Last Dose:Taking  Note written 07/27/2018 1014: PATIENT LAST TOOK OCT 14 - WILL HOLD TILL POST OP     Calcium Carbonate-Vitamin D (CALCIUM + D PO)  Take 2 tablets by mouth Daily after lunch CALCIUM 500 MGWITH VIT D 700IU   Last Dose:Taking  Note written 07/27/2018 1027: HOLDDAY OF SURGERY    carvedilol (COREG CR) 10 MG 24 hr capsule  Take 10 mg by mouth every morning    Last Dose:Taking  Note written 07/27/2018 1014: Take with a sip of water day of surgery     CREON 36000 units Cap DR Particles  Take by mouth 2 (two) times daily as needed    Last Dose:Taking  Note written 07/27/2018 1015: HOLD MORNING OF SURGERY    Cyanocobalamin (VITAMIN B-12 SL)  Take 1,000 Unit by mouth as needed SUPPOSED TO TAKE IT 3 TIMES A WEEK BUT DOES NOT ALWAYS   Last  Dose:Taking  Note written 07/27/2018 1030: HOLD MORNING OF SURGERY     cyclobenzaprine (FLEXERIL) 5 MG tablet  Take 1 tablet (5 mg total) by mouth 3 (three) times daily as needed for Muscle spasms., Starting Thu 06/05/2017, Print  Last Dose:Taking  Note written 07/27/2018 1015: TAKE AS NEEDED    cycloSPORINE (RESTASIS) 0.05 % ophthalmic emulsion  1 drop as needed    Last Dose:Taking  Note written 07/27/2018 1016: USE AS NEEDED    diazePAM (VALIUM) 5 MG tablet  Take 5 mg by mouth nightly as needed TAKES ONEAPPROX ONCE A WEEK OR EVERY 2 WEEKS TO HELP WITH SLEEP    Last Dose:Taking  Note written 07/27/2018 1016: TAKE AS NEEDED    escitalopram (LEXAPRO) 20 MG tablet  Take 20 mg by mouth every morning    Last Dose:Taking  Note written 07/27/2018 1017: Take with a sip of water day of surgery     fluticasone (VERAMYST) 27.5 MCG/SPRAY nasal spray  2 sprays by Nasal route nightly as needed    Last Dose:Taking  Note written 07/27/2018 1219: TAKE AS NEEDED    Latanoprostene Bunod 0.024 % Solution  Apply 1 drop to eye nightly    Last Dose:Taking  Note written 07/27/2018 1023: USE EVENING PRE SURGERY  levothyroxine (SYNTHROID, LEVOTHROID) 100 MCG tablet  Take 112 mcg by mouth Once a day at 6:00am    Last Dose:Taking  Note written 07/27/2018 1018: Take with a sip of water day of surgery    lisinopril (PRINIVIL,ZESTRIL) 10 MG tablet  Take 10 mg by mouth every morning    Last Dose:Taking  Note written 07/27/2018 1019: HOLD MORNING OF SURGERY    mesalamine (LIALDA) 1.2 G EC tablet  Take 4,800 mg by mouth every morning with breakfast    Last Dose:Taking  Note written 07/27/2018 1020: HOLD MORNING OF SURGERY    Multiple Vitamin (MULTIVITAMIN) tablet  Take 1 tablet by mouth daily    Last Dose:Taking  Note written 07/27/2018 1021: LAST TOOK OCT 13 -HOLD TILL POST OP    Multiple Vitamins-Minerals (PRESERVISION AREDS 2 PO)  Take 2 tablets by mouth every evening    Last Dose:Taking  Note written 07/27/2018 1028: LAST TOOK OCT 13-  HOLD TILL POST OP    omeprazole (PRILOSEC) 20 MG capsule  Take 20 mg by mouth 2 (two) times daily    Last Dose:Taking  Note written 07/27/2018 1032: TAKE MORNINGOF SURGERY    vitamin D, ergocalciferol, (DRISDOL) 50000 UNIT Cap  Take 50,000 Units by mouth once a week EVERY MONDAY    Last Dose:Taking  Note written 07/27/2018 1018: STAY ON SCHEDULEEVERY Monday    vitamin E 400 UNIT capsule  Take 400 Units by mouth daily before lunch    Last Dose:Taking  Note written 07/27/2018 1027: LAST TOOK OCT 20 -HOLD TILL POST OP     . Follow eating and drinking restrictions as instructed by surgeon and/or pre surgical services nurse.  . If applicable, follow your surgeon's bowel prep instructions.  . Failure to do so may result in cancellation of your procedure.  . Follow surgeons instructions if they gave specific medication instructions.      Hospital Address and Arrival Instructions:   [address] [place], [place] [address]  [facility] is where you will enter.  A complimentary valet is available at the front [place] entrance of the hospital.  Please call [phone] morning of surgery if you need assistance upon arrival.    Upon arrival in the hospital main lobby via [employer], you will proceed to:  1) Patient Registration, which is all the way down the hall on the left.  2) Once registration is completed, you will be escorted by the registration staff to the Surgical Services Waiting Area where you wait until a PreOp clinical team member escorts you to a room and initiates the PreOp process.  3) You will need to have a valid photo I.D.,  insurance card and co-pay form of payment if required by insurance.    Pre Surgical General Instructions    1. Bathe or shower the morning of the procedure with an anti-bacterial soap before arriving (unless instructed to use hibiclens).  2. Do not apply lotion, perfume, cologne, or hair-care products such as hair spray or gels.  3. Do not shave your surgical  site at home.  4. Do not wear makeup, jewelry including body piercing, watches, earrings, or rings.   5. You may brush your teeth and gargle on the morning of surgery but do not swallow any water.  6. Wear casual, loose fitting and comfortable clothing. A gown will be provided.  7. Plan to leave unnecessary valuables, credit cards (except for co-pay payment use) and jewelry at    home or with  a companion on day of surgery for safe keeping. The hospital is not responsible for lost/stolen items.  8. If you wear contacts please leave them at home. If you wear glasses, please bring a case.  9. Dentures - we will provide a container  10. Hearing aids, you may bring dos but you will be asked to remove them before surgery.  11. Please arrange for someone to drive you home. For your safety you will not be allowed to drive home   after sedation or anesthesia. A responsible adult must be present to accompany you home when you are ready to leave. We strongly recommend that all patients have an adult at home with them for the first 24 hours after surgery.  12. Notify your doctor if you develop any sign of illness before the date of your surgery. Report                 symptoms such as: high fever, sore throat, or other infection, breathing difficulties or chest pain.  13. Discontinue herbal supplements and herbal/green tea one week prior to surgery.      Below is a link/web address  to the Preparing for Your Procedure video that walks you through the surgical experience.   [email or web address]    Possible Anesthesia Side Effects  . Nausea and vomiting. This common side effect usually occurs immediately after the procedure, but some people may continue to feel sick for a day or two. Anti-nausea medicines can help.  . Dry mouth. You may feel parched when you wake up. As long as you're not too nauseated, sipping water can help take care of your dry mouth.  . Sore throat or hoarseness. The tube put in your  throat to help you breathe during surgery can leave you with a sore throat after it's removed.  . Chills and shivering. It's common for your body temperature to drop during general anesthesia. Your doctors and nurses will make sure your temperature doesn't fall too much during surgery, but you may wake up shivering and feeling cold. Your chills may last for a few minutes to hours.  . Confusion and fuzzy thinking. When first waking from anesthesia, you may feel confused, drowsy, and foggy. This usually lasts for just a few hours, but for some people - especially older adults - confusion can last for days or weeks.  . Muscle aches. The drugs used to relax your muscles during surgery can cause soreness afterward.  . Itching. If narcotic (opioid) medications are used during or after your operation, you may be itchy. This is a common side effect of this class of drugs.  . Bladder problems. You may have difficulty passing urine for a short time after general anesthesia.  . Dizziness. You may feel dizzy when you first stand up. Drinking plenty of fluids should help you feel better.

## 2018-07-28 ENCOUNTER — Ambulatory Visit
Admission: RE | Admit: 2018-07-28 | Discharge: 2018-07-28 | Disposition: A | Discharge status: Home / Self Care | Payer: Medicare Other | Source: Ambulatory Visit | Attending: Internal Medicine | Admitting: Internal Medicine

## 2018-07-28 ENCOUNTER — Other Ambulatory Visit: Payer: Self-pay | Admitting: Internal Medicine

## 2018-07-28 DIAGNOSIS — S2231XD Fracture of one rib, right side, subsequent encounter for fracture with routine healing: Secondary | ICD-10-CM | POA: Insufficient documentation

## 2018-07-30 ENCOUNTER — Ambulatory Visit: Payer: Medicare Other | Admitting: Certified Registered"

## 2018-07-30 ENCOUNTER — Encounter
Admission: RE | Disposition: A | Discharge status: Home / Self Care | Payer: Self-pay | Source: Ambulatory Visit | Attending: Gastroenterology

## 2018-07-30 ENCOUNTER — Ambulatory Visit: Payer: Self-pay

## 2018-07-30 ENCOUNTER — Ambulatory Visit
Admission: RE | Admit: 2018-07-30 | Discharge: 2018-07-30 | Disposition: A | Discharge status: Home / Self Care | Payer: Medicare Other | Source: Ambulatory Visit | Attending: Gastroenterology | Admitting: Gastroenterology

## 2018-07-30 DIAGNOSIS — I341 Nonrheumatic mitral (valve) prolapse: Secondary | ICD-10-CM | POA: Insufficient documentation

## 2018-07-30 DIAGNOSIS — E039 Hypothyroidism, unspecified: Secondary | ICD-10-CM | POA: Insufficient documentation

## 2018-07-30 DIAGNOSIS — K219 Gastro-esophageal reflux disease without esophagitis: Secondary | ICD-10-CM | POA: Insufficient documentation

## 2018-07-30 DIAGNOSIS — R112 Nausea with vomiting, unspecified: Secondary | ICD-10-CM | POA: Insufficient documentation

## 2018-07-30 DIAGNOSIS — K529 Noninfective gastroenteritis and colitis, unspecified: Secondary | ICD-10-CM

## 2018-07-30 DIAGNOSIS — I4891 Unspecified atrial fibrillation: Secondary | ICD-10-CM | POA: Insufficient documentation

## 2018-07-30 DIAGNOSIS — K5190 Ulcerative colitis, unspecified, without complications: Secondary | ICD-10-CM | POA: Insufficient documentation

## 2018-07-30 DIAGNOSIS — Z7982 Long term (current) use of aspirin: Secondary | ICD-10-CM | POA: Insufficient documentation

## 2018-07-30 DIAGNOSIS — R198 Other specified symptoms and signs involving the digestive system and abdomen: Secondary | ICD-10-CM

## 2018-07-30 DIAGNOSIS — Z86711 Personal history of pulmonary embolism: Secondary | ICD-10-CM | POA: Insufficient documentation

## 2018-07-30 DIAGNOSIS — K5730 Diverticulosis of large intestine without perforation or abscess without bleeding: Secondary | ICD-10-CM | POA: Insufficient documentation

## 2018-07-30 DIAGNOSIS — K648 Other hemorrhoids: Secondary | ICD-10-CM | POA: Insufficient documentation

## 2018-07-30 DIAGNOSIS — Z9884 Bariatric surgery status: Secondary | ICD-10-CM | POA: Insufficient documentation

## 2018-07-30 DIAGNOSIS — K6389 Other specified diseases of intestine: Secondary | ICD-10-CM

## 2018-07-30 DIAGNOSIS — D649 Anemia, unspecified: Secondary | ICD-10-CM | POA: Insufficient documentation

## 2018-07-30 DIAGNOSIS — K319 Disease of stomach and duodenum, unspecified: Secondary | ICD-10-CM | POA: Insufficient documentation

## 2018-07-30 HISTORY — DX: Pain in unspecified shoulder: M25.519

## 2018-07-30 HISTORY — DX: Depression, unspecified: F32.A

## 2018-07-30 HISTORY — PX: EGD, COLONOSCOPY: SHX3799

## 2018-07-30 HISTORY — DX: Polymyositis, organ involvement unspecified: M33.20

## 2018-07-30 SURGERY — EGD, COLONOSCOPY
Anesthesia: Anesthesia General | Site: Anus | Wound class: Clean Contaminated

## 2018-07-30 MED ORDER — LIDOCAINE HCL (PF) 2 % IJ SOLN
INTRAMUSCULAR | Status: AC
Start: 2018-07-30 — End: ?
  Filled 2018-07-30: qty 5

## 2018-07-30 MED ORDER — PROPOFOL 10 MG/ML IV EMUL (WRAP)
INTRAVENOUS | Status: AC
Start: 2018-07-30 — End: ?
  Filled 2018-07-30: qty 20

## 2018-07-30 MED ORDER — LIDOCAINE HCL 2 % IJ SOLN
INTRAMUSCULAR | Status: DC | PRN
Start: 2018-07-30 — End: 2018-07-30
  Administered 2018-07-30: 100 mg

## 2018-07-30 MED ORDER — LIDOCAINE 1% BUFFERED - CNR/OUTSOURCED
0.3000 mL | Freq: Once | INTRAMUSCULAR | Status: AC
Start: 2018-07-30 — End: 2018-07-30
  Administered 2018-07-30: 13:00:00 0.3 mL via INTRADERMAL

## 2018-07-30 MED ORDER — PROPOFOL 10 MG/ML IV EMUL (WRAP)
INTRAVENOUS | Status: AC
Start: 2018-07-30 — End: ?
  Filled 2018-07-30: qty 50

## 2018-07-30 MED ORDER — LACTATED RINGERS IV SOLN
INTRAVENOUS | Status: DC
Start: 2018-07-30 — End: 2018-07-30
  Administered 2018-07-30: 13:00:00 1000 mL via INTRAVENOUS

## 2018-07-30 MED ORDER — PROPOFOL INFUSION 10 MG/ML
INTRAVENOUS | Status: DC | PRN
Start: 2018-07-30 — End: 2018-07-30
  Administered 2018-07-30: 160 ug/kg/min via INTRAVENOUS

## 2018-07-30 MED ORDER — PROPOFOL 10 MG/ML IV EMUL (WRAP)
INTRAVENOUS | Status: DC | PRN
Start: 2018-07-30 — End: 2018-07-30
  Administered 2018-07-30: 100 mg via INTRAVENOUS
  Administered 2018-07-30: 50 mg via INTRAVENOUS

## 2018-07-30 SURGICAL SUPPLY — 70 items
BALLOON CRE DILTR 12-15MMX8CM (Balloons)
BASIN EME PLS 700ML LF GRAD TRNLU PGMNT (Patient Supply) ×2 IMPLANT
BASIN EMESIS 700 ML GRADUATED TRANSLUCENT PIGMENT FREE PLASTIC (Patient Supply) ×1 IMPLANT
BASIN EMESIS LF (Patient Supply) ×1
BLOCK BITE 60FR LF STRAP FLXB SH DISP (Endoscopic Supplies) ×1
BLOCK BITE OD60 FR ADULT STRAP FLEXIBLE (Endoscopic Supplies) ×1 IMPLANT
BLOCK BITE OD60 FR ADULT STRAP FLEXIBLE SIDEHOLE (Endoscopic Supplies) ×1 IMPLANT
CANNISTER SUCTION 1500CC (Suction)
CATHETER BALLOON DILATATION CRE PEBAX (Balloons) IMPLANT
CATHETER OD6 FR ODSEC12-13.5-15 MM L180 CM CRE BALLOON DILATATION L8 (Balloons) IMPLANT
CATHETER OD6 FR ODSEC12-13.5-15 MM L180 CM CREâ„¢ BALLOON DILATATION L8 (Balloons) IMPLANT
CONTAINER HISTOLOGY 60 ML 30 ML GRADUATE LEAK RESISTANT O RING PREFILL (Procedure Accessories) IMPLANT
DILATOR ESCP PEBAX CRE 6FR 12-13.5-15MM (Balloons)
FORCEP HOT BIOPSY RJ4 (Endoscopic Supplies)
FORCEPS BIOPSY L240 CM +2.8 MM HOT OD2.2 (Endoscopic Supplies) IMPLANT
FORCEPS BIOPSY L240 CM +2.8 MM HOT OD2.2 MM RADIAL JAW (Endoscopic Supplies) IMPLANT
FORCEPS BIOPSY L240 CM JUMBO MICROMESH (Instrument) IMPLANT
FORCEPS BIOPSY L240 CM JUMBO MICROMESH TEETH STREAMLINE CATHETER (Instrument) IMPLANT
FORCEPS BIOPSY L240 CM LARGE CAPACITY (Instrument) IMPLANT
FORCEPS BIOPSY L240 CM MICROMESH TEETH STREAMLINE CATHETER NEEDLE (Instrument) IMPLANT
FORCEPS BIOPSY L240 CM STANDARD CAPACITY (Instrument) ×1 IMPLANT
FORCEPS BX +2.8MM RJ 4 2.2MM 240CM HOT (Endoscopic Supplies)
FORCEPS BX SS JMB RJ 4 2.8MM 240CM STRL (Instrument)
FORCEPS BX SS LG CPC RJ 4 2.4MM 240CM (Instrument)
FORCEPS BX STD CPC RJ 4 2.2MM 240CM STRL (Instrument) ×1
FORCEPS JAW RADIAL JUMBO (Instrument)
FORCEPS RAD JAW 4 BIOSPY W/NDL (Instrument)
FORCEPS RADIAL JAW 3 W/ NEEDLE (Endoscopic Supplies) IMPLANT
FORCEPS RADIAL JAW 4 2.8MM (Instrument) ×1
GOWN ISL SMS XL LF HKLP NK KNIT CUF BLU (Patient Supply) ×2
GOWN ISO YELLOW UNIVERSAL (Gown) IMPLANT
GOWN ISOLATION XL HOOK LOOP NECK KNIT (Patient Supply) ×2 IMPLANT
GOWN XL - NON STERILE (Patient Supply) ×2
KIT ENDOSCOPIC 2 BUNDLE COMPLIANCE (Endoscopic Supplies) ×1 IMPLANT
KIT ENDOSCOPIC 2 BUNDLE COMPLIANCE ENDOKIT 2 OZ (Endoscopic Supplies) ×1 IMPLANT
KIT GI CUSTOM ILH (Endoscopic Supplies) ×2
KIT SMARTPREP APC 30 30ML (Kits) IMPLANT
LINER SCT 1500CC THNWL MDVC FLX ADV LF (Suction) IMPLANT
MARKER ENDOSCOPIC PERMANENT INDICATION (Syringes, Needles) IMPLANT
MARKER ENDOSCOPIC PERMANENT INDICATION DARK SYRINGE SPOT EX 5 ML (Syringes, Needles) IMPLANT
MARKER ESCP 5ML SPOT EX PERM INDCT DRK (Syringes, Needles)
MOUTHPIECE BITEBLOCK ADULT (Endoscopic Supplies) ×1
NEEDLE INJC DISP NM LOWER GI (Needles) IMPLANT
NET SPEC RTRVL STD RTHNT 2.5MM 230CM LF (Urology Supply)
NET SPECIMEN RETRIEVAL L230 CM STANDARD (Urology Supply) IMPLANT
NET SPECIMEN RETRIEVAL L230 CM STANDARD SHEATH OD2.5 MM L6 CM X W3 CM (Urology Supply) IMPLANT
PAD ELECTROSRG GRND REM W CRD (Procedure Accessories) IMPLANT
RETRIEVER ROTH NET STD 2.5MM (Urology Supply)
SNARE CAPTIFLEX 27MM (Endoscopic Supplies)
SNARE CAPTIVATOR 13MMX240CM (GE Lab Supplies)
SNARE ESCP MED OVL CPTFLX 2.4MM 240CM (Endoscopic Supplies)
SNARE ESCP MIC CPTVTR 13MM 240IN STRL (GE Lab Supplies) IMPLANT
SNARE MD OVAL 240CM 2.4 MM CPTFLX LP FLXBL ENDOSCOPIC POLYPECTOMY 27MM (Endoscopic Supplies) IMPLANT
SNARE MEDIUM OVAL L240 CM OD2.4 MM (Endoscopic Supplies) IMPLANT
SNARE SMALL HEXAGON CAPTIVATOR STIFF ENDOSCOPIC POLYPECTOMY (GE Lab Supplies) IMPLANT
SOL FORMALIN 10% PREFILL 30ML (Procedure Accessories) ×24 IMPLANT
SPEEDBAND SUPERVIEW SUPER (Endoscopic Supplies) IMPLANT
SPONGE GAUZE L4 IN X W4 IN 4 PLY HIGH (Sponge) IMPLANT
SPONGE GAUZE L4 IN X W4 IN 4 PLY NONWOVEN LINT FREE CURITY RAYON (Sponge) IMPLANT
SPONGE GAUZE VERSA 4PLY 4X4 (Sponge)
SPONGE GZE RYN PLSTR CRTY 4X4IN LF NS 4 (Sponge)
SYRING SPOT EX ENDO MARK 5CC (Syringes, Needles)
SYRINGE 50 ML GRADUATE NONPYROGENIC DEHP (Syringes, Needles) ×1 IMPLANT
SYRINGE 50 ML GRADUATE NONPYROGENIC DEHP FREE PVC FREE BD MEDICAL (Syringes, Needles) IMPLANT
SYRINGE MED 50ML LF STRL GRAD N-PYRG (Syringes, Needles) ×1
SYRINGE SLIP-TIP 60CC (Syringes, Needles) ×1
TRAP SPECIMEN LF (Procedure Accessories) IMPLANT
VALVE AIR/WATER DEFENDO KIT SUCTION (Suction) ×2 IMPLANT
VALVE AIR/WATER DEFENDO KIT SUCTION BUTTON BIOPSY (Suction) ×2 IMPLANT
VALVE DEFENDO SUCTION AIR/WTR (Suction) ×4
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
## 2018-07-30 NOTE — Discharge Instr - AVS First Page (Addendum)
Reason for your Hospital Admission:      Instructions for after your discharge:    -continue current meds  -results will be available in 3-4 weeks  -follow up with Dr. Donia Pounds Anesthesia Discharge Instructions    Although you may be awake and alert in the recovery room, small amounts of anesthetic remain in your system for about 24 hours.  You may feel tired and sleepy during this time.      You are advised to go directly home from the hospital.    Plan to stay at home and rest for the remainder of the day.    It is advisable to have someone with you at home for 24 hours after surgery.    Do not operate a motor vehicle, or any mechanical or electrical equipment for the next 24 hours.      Be careful when you are walking around, you may become dizzy.  The effects of anesthesia and/or medications are still present and drowsiness may occur    Do not consume alcohol, tranquilizers, sleeping medications, or any other non prescribed medication for the remainder of the day.    Diet:  begin with liquids, progress your diet as tolerated or as directed by your surgeon.  Nausea and vomiting may occur in the next 24 hours.

## 2018-07-30 NOTE — Transfer of Care (Signed)
Anesthesia Transfer of Care Note    Patient: Leslie Gross    Procedures performed: Procedure(s):  EGD with biopsy, COLONOSCOPY with biopsy    Anesthesia type: General TIVA    Patient location:Phase II PACU    Last vitals:   Vitals:    07/30/18 1410   BP: 110/56   Pulse: 85   Resp: 20   Temp: 36.4 C (97.5 F)   SpO2: 98%       Post pain: Patient not complaining of pain, continue current therapy      Mental Status:awake    Respiratory Function: tolerating room air    Cardiovascular: stable    Nausea/Vomiting: patient not complaining of nausea or vomiting    Hydration Status: adequate    Post assessment: no apparent anesthetic complications    Signed by: Meriel Flavors  07/30/18 2:11 PM

## 2018-07-30 NOTE — Anesthesia Preprocedure Evaluation (Signed)
Anesthesia Evaluation    AIRWAY    Mallampati: I         CARDIOVASCULAR    cardiovascular exam normal       DENTAL    no notable dental hx     PULMONARY    pulmonary exam normal     OTHER FINDINGS                  Relevant Problems   NEURO/PSYCH   (+) Cervicogenic headache               Anesthesia Plan    ASA 3     general                     intravenous induction           Post op pain management: per surgeon    informed consent obtained    Plan discussed with CRNA.                   Signed by: Mindi Junker 07/30/18 1:33 PM

## 2018-07-30 NOTE — Anesthesia Postprocedure Evaluation (Addendum)
Anesthesia Post Evaluation    Patient: Leslie Gross    Procedure(s):  EGD with biopsy, COLONOSCOPY with biopsy    Anesthesia type: general    Last Vitals:   Vitals Value Taken Time   BP 110/56 07/30/2018  2:10 PM   Temp 36.4 C (97.5 F) 07/30/2018  2:10 PM   Pulse 85 07/30/2018  2:10 PM   Resp 20 07/30/2018  2:10 PM   SpO2 98 % 07/30/2018  2:10 PM       Anesthesia Post Evaluation:     Patient Evaluated: PACU  Patient Participation: complete - patient participated  Level of Consciousness: awake  Pain Score: 0  Pain Management: adequate    Airway Patency: patent    Anesthetic complications: No      PONV Status: none    Cardiovascular status: acceptable  Respiratory status: acceptable  Hydration status: acceptable        Anesthesia Qualified Clinical Data Registry 2018    PACU Reintubation  Did the Patient have general anesthesia with intubation: No        PONV Adult  Is the patient aged 69 or older: Yes  Did the patient receive recieve a general anesthestic: Yes  Does the patient have 3 or more risk factors for PONV? Yes  Did the patient receive anti-emetics from at least two classes of medications? Yes      PONV Pediatric  Is the patient aged 24-17? No            PACU Transfer Checklist Protocol  Was the patient transferred to the PACU at the conclusion of surgery? Yes  Was a checklist or transfer protocol used? Yes    ICU Transfer Checklist Protocol  Was the patient transferred to the ICU at the conclusion of surgery? No      Post-op Pain Assessment Prior to Anesthesia Care End  Age >=18 and assessed for pain in PACU: Yes  Pacu pain score <7/10: Yes      Perioperative Mortality  Perioperative mortality prior to Anesthesia end time: No    Perioperative Cardiac Arrest  Did the patient have an unanticipated intraoperative cardiac arrest between anesthesia start time and anesthesia end time? No    Unplanned Admission to ICU  Did the patient have an unplanned admission to the ICU (not initially anticipated at  anesthesia start time)? No      Signed by: Meriel Flavors, 07/30/2018 2:10 PM

## 2018-07-31 LAB — LAB USE ONLY - HISTORICAL SURGICAL PATHOLOGY

## 2018-08-03 ENCOUNTER — Encounter: Payer: Self-pay | Admitting: Gastroenterology

## 2018-08-04 ENCOUNTER — Other Ambulatory Visit: Payer: Self-pay

## 2018-08-12 ENCOUNTER — Emergency Department
Admission: EM | Admit: 2018-08-12 | Discharge: 2018-08-12 | Disposition: A | Discharge status: Home / Self Care | Payer: Medicare Other | Attending: Emergency Medicine | Admitting: Emergency Medicine

## 2018-08-12 ENCOUNTER — Emergency Department: Payer: Medicare Other

## 2018-08-12 ENCOUNTER — Encounter: Payer: Self-pay | Admitting: Emergency Medicine

## 2018-08-12 ENCOUNTER — Other Ambulatory Visit: Payer: Self-pay

## 2018-08-12 DIAGNOSIS — S299XXA Unspecified injury of thorax, initial encounter: Secondary | ICD-10-CM | POA: Diagnosis present

## 2018-08-12 DIAGNOSIS — S32020A Wedge compression fracture of second lumbar vertebra, initial encounter for closed fracture: Secondary | ICD-10-CM | POA: Diagnosis not present

## 2018-08-12 DIAGNOSIS — X500XXA Overexertion from strenuous movement or load, initial encounter: Secondary | ICD-10-CM | POA: Insufficient documentation

## 2018-08-12 DIAGNOSIS — S22080A Wedge compression fracture of T11-T12 vertebra, initial encounter for closed fracture: Secondary | ICD-10-CM | POA: Insufficient documentation

## 2018-08-12 DIAGNOSIS — Y93F2 Activity, caregiving, lifting: Secondary | ICD-10-CM | POA: Diagnosis not present

## 2018-08-12 DIAGNOSIS — Y999 Unspecified external cause status: Secondary | ICD-10-CM | POA: Insufficient documentation

## 2018-08-12 DIAGNOSIS — Y929 Unspecified place or not applicable: Secondary | ICD-10-CM | POA: Insufficient documentation

## 2018-08-12 MED ORDER — OXYCODONE-ACETAMINOPHEN 5-325 MG PO TABS: 1.0000 | ORAL_TABLET | ORAL | 0 refills | Status: DC | PRN

## 2018-08-12 MED ORDER — ONDANSETRON 4 MG PO TBDP
4.0000 mg | ORAL_TABLET | Freq: Once | ORAL | Status: AC
  Administered 2018-08-12: 4 mg via ORAL
  Filled 2018-08-12: qty 1

## 2018-08-12 MED ORDER — MORPHINE SULFATE (PF) 4 MG/ML IV SOLN
4.0000 mg | Freq: Once | INTRAVENOUS | Status: AC
  Administered 2018-08-12: 4 mg via INTRAMUSCULAR
  Filled 2018-08-12: qty 1

## 2018-08-12 NOTE — ED Provider Notes (Signed)
[facility] Emergency Department Provider Note  ____________________________________________   First MD Initiated Contact with Patient 08/12/18 1641     (approximate)  I have reviewed the triage vital signs and the nursing notes.   HISTORY  Chief Complaint Back Pain    HPI Renee Walker is a 67 y.o. female presents to the emergency department complaining of low back pain.  She states she fell and fractured her back last year.  She is been here helping her mother who is 94 something years old.  She is been having to lift her if she falls frequently.  She states she feels like she has hurt her back again.  She is unsure if she has any fractures at this time.  She states it feels the same as it did when she broke her back.  She denies numbness or tingling.  She denies any change in bladder or bowel habits.    History reviewed. No pertinent past medical history.  There are no active problems to display for this patient.   Past Surgical History:  Procedure Laterality Date  . ABDOMINAL SURGERY    . BACK SURGERY    . CHOLECYSTECTOMY      Prior to Admission medications   Medication Sig Start Date End Date Taking? Authorizing Provider  oxyCODONE-acetaminophen (PERCOCET/ROXICET) 5-325 MG tablet Take 1 tablet by mouth every 4 (four) hours as needed for severe pain. 08/12/18   Faythe Ghee, PA-C    Allergies Diflucan [fluconazole]; Penicillins; and Shellfish allergy  No family history on file.  Social History Social History   Tobacco Use  . Smoking status: Never Smoker  . Smokeless tobacco: Never Used  Substance Use Topics  . Alcohol use: Not Currently  . Drug use: Never    Review of Systems  Constitutional: No fever/chills Eyes: No visual changes. ENT: No sore throat. Respiratory: Denies cough Genitourinary: Negative for dysuria. Musculoskeletal: Positive for back pain. Skin: Negative for  rash.    ____________________________________________   PHYSICAL EXAM:  VITAL SIGNS: ED Triage Vitals  Enc Vitals Group     BP 08/12/18 1454 100/64     Pulse Rate 08/12/18 1454 86     Resp 08/12/18 1454 18     Temp 08/12/18 1454 98.4 F (36.9 C)     Temp Source 08/12/18 1454 Oral     SpO2 08/12/18 1454 96 %     Weight 08/12/18 1455 155 lb (70.3 kg)     Height 08/12/18 1455 5\' 4"  (1.626 m)     Head Circumference --      Peak Flow --      Pain Score 08/12/18 1456 8     Pain Loc --      Pain Edu? --      Excl. in GC? --     Constitutional: Alert and oriented. Well appearing and in no acute distress. Eyes: Conjunctivae are normal.  Head: Atraumatic. Nose: No congestion/rhinnorhea. Mouth/Throat: Mucous membranes are moist.   Neck:  supple no lymphadenopathy noted Cardiovascular: Normal rate, regular rhythm. Heart sounds are normal Respiratory: Normal respiratory effort.  No retractions, lungs c t a  Abd: soft nontender bs normal all 4 quad GU: deferred Musculoskeletal: The T-spine and L-spine are tender along T10-L3.  The paravertebral muscles are tender.  Patient is able to flex and extend the toes.  Full strength in the toes bilaterally.  Neurovascular is intact.   Neurologic:  Normal speech and language.  Skin:  Skin is warm,  dry and intact. No rash noted. Psychiatric: Mood and affect are normal. Speech and behavior are normal.  ____________________________________________   LABS (all labs ordered are listed, but only abnormal results are displayed)  Labs Reviewed - No data to display ____________________________________________   ____________________________________________  RADIOLOGY  Due to the patient's previous fractures and her age CT of the thoracic and lumbar spine were ordered.  CT showed compression fractures at T12 and L2 ____________________________________________   PROCEDURES  Procedure(s) performed: Morphine 4 mg IM, Zofran 4 mg  ODT  Procedures    ____________________________________________   INITIAL IMPRESSION / ASSESSMENT AND PLAN / ED COURSE  Pertinent labs & imaging results that were available during my care of the patient were reviewed by me and considered in my medical decision making (see chart for details).   Patient 67 year old female presents emergency department complaining of back pain.  On physical exam the lower thoracic spine and upper lumbar spine are both tender to palpation.  CT of the thoracic and lumbar spine show a compression fracture at T12 and L2.  Discussed the findings with the patient.  She states that when she had a previous back fracture there was only one.  Therefore 1 of the compression fractures is new.  She was given morphine 4 mg IM and Zofran 4 mg ODT.  She was given a prescription for Percocet.  We discussed the use of narcotics and addiction chances.  We also discussed using a stool softener to prevent constipation.  She states she understands and will comply.  She was discharged in stable condition.     As part of my medical decision making, I reviewed the following data within the electronic MEDICAL RECORD NUMBER Nursing notes reviewed and incorporated, Old chart reviewed, Radiograph reviewed CT of the lumbar and thoracic spine show compression fractures at T12 and L2, Notes from prior ED visits and [place] Controlled Substance Database  ____________________________________________   FINAL CLINICAL IMPRESSION(S) / ED DIAGNOSES  Final diagnoses:  Compression fracture of T12 vertebra, initial encounter (HCC)  Compression fracture of L2 vertebra, initial encounter (HCC)      NEW MEDICATIONS STARTED DURING THIS VISIT:  New Prescriptions   OXYCODONE-ACETAMINOPHEN (PERCOCET/ROXICET) 5-325 MG TABLET    Take 1 tablet by mouth every 4 (four) hours as needed for severe pain.     Note:  This document was prepared using Dragon voice recognition software and may include  unintentional dictation errors.    Faythe Ghee, PA-C 08/12/18 Renee Walker    Arnaldo Natal, MD 08/12/18 [phone]

## 2018-08-12 NOTE — ED Notes (Signed)
PA in room to assess pt .

## 2018-08-12 NOTE — ED Notes (Signed)
Pt reports broke her back last year, is here from out of town helping her elderly mother. Pt reports she has ben helping her mother up from her falls and has re-injured her back. Pt denies numbness and tingling in her legs, denies abd pain but reports it is in her sides. Pt reports pain takes her breath away.

## 2018-08-12 NOTE — ED Triage Notes (Signed)
Pt c/o back pain, hx of back injury last year. PT states she thinks she re injured back this week. PT ambulatory, NAD noted

## 2018-08-12 NOTE — Discharge Instructions (Addendum)
Follow-up with Dr. Rosita Kea.  Please call for an appointment.  Return to the emergency department if you are worsening.  Take the Percocet as needed for pain.  Be sure to take a stool softener along with this medication to prevent constipation.

## 2018-08-14 NOTE — ED Notes (Signed)
[facility] pharmacy inquiring if pt could take 2 percocet instead of 1. Per Dr. Don Perking OK for pt to take 1-2 Percocet every 4-6 hours as needed for pain.   Chinita Greenland at [facility] informed of above.

## 2018-08-17 ENCOUNTER — Other Ambulatory Visit: Payer: Self-pay | Admitting: Orthopedic Surgery

## 2018-08-17 DIAGNOSIS — S22000A Wedge compression fracture of unspecified thoracic vertebra, initial encounter for closed fracture: Secondary | ICD-10-CM

## 2018-08-17 DIAGNOSIS — S32020A Wedge compression fracture of second lumbar vertebra, initial encounter for closed fracture: Secondary | ICD-10-CM

## 2018-08-18 ENCOUNTER — Ambulatory Visit
Admission: RE | Admit: 2018-08-18 | Discharge: 2018-08-18 | Disposition: A | Discharge status: Home / Self Care | Payer: Medicare Other | Source: Ambulatory Visit | Attending: Orthopedic Surgery | Admitting: Orthopedic Surgery

## 2018-08-18 ENCOUNTER — Encounter: Payer: Self-pay | Admitting: *Deleted

## 2018-08-18 DIAGNOSIS — S32020A Wedge compression fracture of second lumbar vertebra, initial encounter for closed fracture: Secondary | ICD-10-CM

## 2018-08-18 DIAGNOSIS — S32029A Unspecified fracture of second lumbar vertebra, initial encounter for closed fracture: Secondary | ICD-10-CM | POA: Diagnosis not present

## 2018-08-18 DIAGNOSIS — M5124 Other intervertebral disc displacement, thoracic region: Secondary | ICD-10-CM | POA: Insufficient documentation

## 2018-08-18 DIAGNOSIS — S22000A Wedge compression fracture of unspecified thoracic vertebra, initial encounter for closed fracture: Secondary | ICD-10-CM | POA: Diagnosis present

## 2018-08-19 ENCOUNTER — Other Ambulatory Visit: Payer: Self-pay

## 2018-08-19 ENCOUNTER — Ambulatory Visit
Admission: RE | Admit: 2018-08-19 | Discharge: 2018-08-19 | Disposition: A | Discharge status: Home / Self Care | Payer: Medicare Other | Source: Ambulatory Visit | Attending: Orthopedic Surgery | Admitting: Orthopedic Surgery

## 2018-08-19 ENCOUNTER — Ambulatory Visit: Payer: Medicare Other

## 2018-08-19 ENCOUNTER — Encounter: Payer: Self-pay | Admitting: *Deleted

## 2018-08-19 ENCOUNTER — Ambulatory Visit: Payer: Medicare Other | Admitting: Anesthesiology

## 2018-08-19 ENCOUNTER — Encounter
Admission: RE | Disposition: A | Discharge status: Home / Self Care | Payer: Self-pay | Source: Ambulatory Visit | Attending: Orthopedic Surgery

## 2018-08-19 DIAGNOSIS — E039 Hypothyroidism, unspecified: Secondary | ICD-10-CM | POA: Insufficient documentation

## 2018-08-19 DIAGNOSIS — H409 Unspecified glaucoma: Secondary | ICD-10-CM | POA: Diagnosis not present

## 2018-08-19 DIAGNOSIS — Z7952 Long term (current) use of systemic steroids: Secondary | ICD-10-CM | POA: Insufficient documentation

## 2018-08-19 DIAGNOSIS — Z79891 Long term (current) use of opiate analgesic: Secondary | ICD-10-CM | POA: Diagnosis not present

## 2018-08-19 DIAGNOSIS — F329 Major depressive disorder, single episode, unspecified: Secondary | ICD-10-CM | POA: Insufficient documentation

## 2018-08-19 DIAGNOSIS — K5190 Ulcerative colitis, unspecified, without complications: Secondary | ICD-10-CM | POA: Insufficient documentation

## 2018-08-19 DIAGNOSIS — Z79890 Hormone replacement therapy (postmenopausal): Secondary | ICD-10-CM | POA: Insufficient documentation

## 2018-08-19 DIAGNOSIS — Z9884 Bariatric surgery status: Secondary | ICD-10-CM | POA: Insufficient documentation

## 2018-08-19 DIAGNOSIS — Z96652 Presence of left artificial knee joint: Secondary | ICD-10-CM | POA: Diagnosis not present

## 2018-08-19 DIAGNOSIS — Z79899 Other long term (current) drug therapy: Secondary | ICD-10-CM | POA: Insufficient documentation

## 2018-08-19 DIAGNOSIS — J45909 Unspecified asthma, uncomplicated: Secondary | ICD-10-CM | POA: Insufficient documentation

## 2018-08-19 DIAGNOSIS — Z7982 Long term (current) use of aspirin: Secondary | ICD-10-CM | POA: Diagnosis not present

## 2018-08-19 DIAGNOSIS — S22089A Unspecified fracture of T11-T12 vertebra, initial encounter for closed fracture: Secondary | ICD-10-CM | POA: Diagnosis present

## 2018-08-19 DIAGNOSIS — F419 Anxiety disorder, unspecified: Secondary | ICD-10-CM | POA: Diagnosis not present

## 2018-08-19 DIAGNOSIS — Z96611 Presence of right artificial shoulder joint: Secondary | ICD-10-CM | POA: Diagnosis not present

## 2018-08-19 DIAGNOSIS — Z86711 Personal history of pulmonary embolism: Secondary | ICD-10-CM | POA: Diagnosis not present

## 2018-08-19 DIAGNOSIS — S32020A Wedge compression fracture of second lumbar vertebra, initial encounter for closed fracture: Secondary | ICD-10-CM | POA: Diagnosis not present

## 2018-08-19 DIAGNOSIS — X509XXA Other and unspecified overexertion or strenuous movements or postures, initial encounter: Secondary | ICD-10-CM | POA: Insufficient documentation

## 2018-08-19 DIAGNOSIS — M3320 Polymyositis, organ involvement unspecified: Secondary | ICD-10-CM | POA: Diagnosis not present

## 2018-08-19 DIAGNOSIS — Z419 Encounter for procedure for purposes other than remedying health state, unspecified: Secondary | ICD-10-CM

## 2018-08-19 HISTORY — DX: Depression, unspecified: F32.A

## 2018-08-19 HISTORY — DX: Anemia, unspecified: D64.9

## 2018-08-19 HISTORY — DX: Anxiety disorder, unspecified: F41.9

## 2018-08-19 HISTORY — DX: Sleep apnea, unspecified: G47.30

## 2018-08-19 HISTORY — DX: Other pulmonary embolism without acute cor pulmonale: I26.99

## 2018-08-19 HISTORY — DX: Gastro-esophageal reflux disease without esophagitis: K21.9

## 2018-08-19 HISTORY — DX: Nonrheumatic mitral (valve) prolapse: I34.1

## 2018-08-19 HISTORY — DX: Pneumonia, unspecified organism: J18.9

## 2018-08-19 HISTORY — DX: Hyperlipidemia, unspecified: E78.5

## 2018-08-19 HISTORY — DX: Unspecified glaucoma: H40.9

## 2018-08-19 HISTORY — DX: Obesity, unspecified: E66.9

## 2018-08-19 HISTORY — DX: Hypothyroidism, unspecified: E03.9

## 2018-08-19 HISTORY — DX: Polymyositis, organ involvement unspecified: M33.20

## 2018-08-19 HISTORY — DX: Hypertensive heart disease without heart failure: I11.9

## 2018-08-19 HISTORY — DX: Acute pancreatitis without necrosis or infection, unspecified: K85.90

## 2018-08-19 HISTORY — DX: Major depressive disorder, single episode, unspecified: F32.9

## 2018-08-19 HISTORY — PX: KYPHOPLASTY: SHX5884

## 2018-08-19 HISTORY — DX: Palpitations: R00.2

## 2018-08-19 HISTORY — DX: Spondylosis without myelopathy or radiculopathy, cervical region: M47.812

## 2018-08-19 HISTORY — DX: Atherosclerosis of aorta: I70.0

## 2018-08-19 HISTORY — DX: Cardiac murmur, unspecified: R01.1

## 2018-08-19 HISTORY — DX: Unspecified asthma, uncomplicated: J45.909

## 2018-08-19 HISTORY — DX: Ulcerative colitis, unspecified, without complications: K51.90

## 2018-08-19 LAB — BASIC METABOLIC PANEL
Anion gap: 9 (ref 5–15)
BUN: 27 mg/dL — ABNORMAL HIGH (ref 8–23)
CO2: 27 mmol/L (ref 22–32)
Calcium: 8.7 mg/dL — ABNORMAL LOW (ref 8.9–10.3)
Chloride: 104 mmol/L (ref 98–111)
Creatinine, Ser: 0.74 mg/dL (ref 0.44–1.00)
GFR calc non Af Amer: >60 mL/min (ref >60)
Glucose, Bld: 115 mg/dL — ABNORMAL HIGH (ref 70–99)
Potassium: 4.6 mmol/L (ref 3.5–5.1)
SODIUM: 140 mmol/L (ref 135–145)

## 2018-08-19 LAB — CBC
HCT: 39.7 % (ref 36.0–46.0)
Hemoglobin: 12.3 g/dL (ref 12.0–15.0)
MCH: 30.4 pg (ref 26.0–34.0)
MCHC: 31 g/dL (ref 30.0–36.0)
MCV: 98.3 fL (ref 80.0–100.0)
NRBC: 0 % (ref 0.0–0.2)
Platelets: 329 10*3/uL (ref 150–400)
RBC: 4.04 MIL/uL (ref 3.87–5.11)
RDW: 14.4 % (ref 11.5–15.5)
WBC: 12.2 10*3/uL — ABNORMAL HIGH (ref 4.0–10.5)

## 2018-08-19 SURGERY — KYPHOPLASTY
Anesthesia: General | Site: Spine Thoracic

## 2018-08-19 MED ORDER — MIDAZOLAM HCL 2 MG/2ML IJ SOLN
INTRAMUSCULAR | Status: AC
  Filled 2018-08-19: qty 2

## 2018-08-19 MED ORDER — SODIUM CHLORIDE 0.9 % IV SOLN: INTRAVENOUS | Status: DC

## 2018-08-19 MED ORDER — LIDOCAINE HCL (PF) 2 % IJ SOLN
INTRAMUSCULAR | Status: AC
  Filled 2018-08-19: qty 10

## 2018-08-19 MED ORDER — CLINDAMYCIN PHOSPHATE 600 MG/50ML IV SOLN
INTRAVENOUS | Status: AC
  Filled 2018-08-19: qty 50

## 2018-08-19 MED ORDER — FENTANYL CITRATE (PF) 100 MCG/2ML IJ SOLN
50.0000 ug | Freq: Once | INTRAMUSCULAR | Status: AC
  Administered 2018-08-19: 50 ug via INTRAVENOUS

## 2018-08-19 MED ORDER — HYDROCODONE-ACETAMINOPHEN 5-325 MG PO TABS: 1.0000 | ORAL_TABLET | ORAL | Status: DC | PRN

## 2018-08-19 MED ORDER — FAMOTIDINE 20 MG PO TABS
20.0000 mg | ORAL_TABLET | Freq: Once | ORAL | Status: AC
  Administered 2018-08-19: 20 mg via ORAL

## 2018-08-19 MED ORDER — METOCLOPRAMIDE HCL 5 MG/ML IJ SOLN: 5.0000 mg | Freq: Three times a day (TID) | INTRAMUSCULAR | Status: DC | PRN

## 2018-08-19 MED ORDER — FENTANYL CITRATE (PF) 100 MCG/2ML IJ SOLN
INTRAMUSCULAR | Status: AC
  Filled 2018-08-19: qty 2

## 2018-08-19 MED ORDER — SODIUM CHLORIDE 0.9 % IV SOLN
INTRAVENOUS | Status: DC
  Administered 2018-08-19 (×2): via INTRAVENOUS

## 2018-08-19 MED ORDER — IOPAMIDOL (ISOVUE-M 200) INJECTION 41%
INTRAMUSCULAR | Status: DC | PRN
  Administered 2018-08-19 (×2): 20 mL

## 2018-08-19 MED ORDER — PROPOFOL 500 MG/50ML IV EMUL
INTRAVENOUS | Status: AC
  Filled 2018-08-19: qty 50

## 2018-08-19 MED ORDER — PROPOFOL 10 MG/ML IV BOLUS
INTRAVENOUS | Status: DC | PRN
  Administered 2018-08-19 (×2): 10 mg via INTRAVENOUS
  Administered 2018-08-19: 20 mg via INTRAVENOUS

## 2018-08-19 MED ORDER — MIDAZOLAM HCL 2 MG/2ML IJ SOLN
1.0000 mg | Freq: Once | INTRAMUSCULAR | Status: AC
  Administered 2018-08-19: 1 mg via INTRAVENOUS

## 2018-08-19 MED ORDER — FENTANYL CITRATE (PF) 100 MCG/2ML IJ SOLN
INTRAMUSCULAR | Status: DC | PRN
  Administered 2018-08-19 (×2): 50 ug via INTRAVENOUS

## 2018-08-19 MED ORDER — LIDOCAINE HCL (CARDIAC) PF 100 MG/5ML IV SOSY
PREFILLED_SYRINGE | INTRAVENOUS | Status: DC | PRN
  Administered 2018-08-19: 80 mg via INTRATRACHEAL

## 2018-08-19 MED ORDER — FAMOTIDINE 20 MG PO TABS
ORAL_TABLET | ORAL | Status: AC
  Filled 2018-08-19: qty 1

## 2018-08-19 MED ORDER — ONDANSETRON HCL 4 MG/2ML IJ SOLN: 4.0000 mg | Freq: Once | INTRAMUSCULAR | Status: DC | PRN

## 2018-08-19 MED ORDER — MIDAZOLAM HCL 2 MG/2ML IJ SOLN: INTRAMUSCULAR | Status: DC | PRN

## 2018-08-19 MED ORDER — CLINDAMYCIN PHOSPHATE 600 MG/50ML IV SOLN
600.0000 mg | Freq: Once | INTRAVENOUS | Status: AC
  Administered 2018-08-19: 600 mg via INTRAVENOUS

## 2018-08-19 MED ORDER — METOCLOPRAMIDE HCL 10 MG PO TABS: 5.0000 mg | ORAL_TABLET | Freq: Three times a day (TID) | ORAL | Status: DC | PRN

## 2018-08-19 MED ORDER — ONDANSETRON HCL 4 MG/2ML IJ SOLN: 4.0000 mg | Freq: Four times a day (QID) | INTRAMUSCULAR | Status: DC | PRN

## 2018-08-19 MED ORDER — MORPHINE SULFATE (PF) 4 MG/ML IV SOLN: 2.0000 mg | Freq: Once | INTRAVENOUS | Status: DC

## 2018-08-19 MED ORDER — BUPIVACAINE-EPINEPHRINE (PF) 0.5% -1:200000 IJ SOLN
INTRAMUSCULAR | Status: DC | PRN
  Administered 2018-08-19: 30 mL via PERINEURAL

## 2018-08-19 MED ORDER — LIDOCAINE HCL 1 % IJ SOLN
INTRAMUSCULAR | Status: DC | PRN
  Administered 2018-08-19: 10 mL

## 2018-08-19 MED ORDER — PROPOFOL 500 MG/50ML IV EMUL
INTRAVENOUS | Status: DC | PRN
  Administered 2018-08-19: 125 ug/kg/min via INTRAVENOUS

## 2018-08-19 MED ORDER — MIDAZOLAM HCL 2 MG/2ML IJ SOLN
INTRAMUSCULAR | Status: DC | PRN
  Administered 2018-08-19 (×2): 1 mg via INTRAVENOUS

## 2018-08-19 MED ORDER — ONDANSETRON HCL 4 MG PO TABS: 4.0000 mg | ORAL_TABLET | Freq: Four times a day (QID) | ORAL | Status: DC | PRN

## 2018-08-19 MED ORDER — FENTANYL CITRATE (PF) 100 MCG/2ML IJ SOLN
25.0000 ug | INTRAMUSCULAR | Status: DC | PRN
  Administered 2018-08-19 (×3): 50 ug via INTRAVENOUS

## 2018-08-19 MED ORDER — SODIUM CHLORIDE 0.9 % IV SOLN
INTRAVENOUS | Status: DC
  Administered 2018-08-19: 13:00:00 via INTRAVENOUS

## 2018-08-19 SURGICAL SUPPLY — 17 items
CEMENT KYPHON CX01A KIT/MIXER (Cement) ×2 IMPLANT
COVER WAND RF STERILE (DRAPES) ×2 IMPLANT
DERMABOND ADVANCED (GAUZE/BANDAGES/DRESSINGS) ×1
DERMABOND ADVANCED .7 DNX12 (GAUZE/BANDAGES/DRESSINGS) ×1 IMPLANT
DEVICE BIOPSY BONE KYPHX (INSTRUMENTS) ×2 IMPLANT
DRAPE C-ARM XRAY 36X54 (DRAPES) ×2 IMPLANT
DURAPREP 26ML APPLICATOR (WOUND CARE) ×2 IMPLANT
GLOVE SURG SYN 9.0  PF PI (GLOVE) ×1
GLOVE SURG SYN 9.0 PF PI (GLOVE) ×1 IMPLANT
GOWN SRG 2XL LVL 4 RGLN SLV (GOWNS) ×1 IMPLANT
GOWN STRL NON-REIN 2XL LVL4 (GOWNS) ×1
GOWN STRL REUS W/ TWL LRG LVL3 (GOWN DISPOSABLE) ×1 IMPLANT
GOWN STRL REUS W/TWL LRG LVL3 (GOWN DISPOSABLE) ×1
PACK KYPHOPLASTY (MISCELLANEOUS) ×2 IMPLANT
STRAP SAFETY 5IN WIDE (MISCELLANEOUS) ×2 IMPLANT
TRAY KYPHOPAK 15/3 EXPRESS 1ST (MISCELLANEOUS) ×2 IMPLANT
TRAY KYPHOPAK 20/3 EXPRESS 1ST (MISCELLANEOUS) ×1 IMPLANT

## 2018-08-19 NOTE — Op Note (Signed)
08/19/2018  3:04 PM  PATIENT:  Renee Walker  67 y.o. female  PRE-OPERATIVE DIAGNOSIS:  T 12 compression fracture  POST-OPERATIVE DIAGNOSIS:  T12 compression fracture  PROCEDURE:  Procedure(s): KYPHOPLASTY- T12 (N/A)  SURGEON: Laurene Footman, MD  ASSISTANTS: None  ANESTHESIA:   local and MAC  EBL:  Total I/O In: 750 [I.V.:750] Out: 10 [Blood:10]  BLOOD ADMINISTERED:none  DRAINS: none   LOCAL MEDICATIONS USED:  MARCAINE    and XYLOCAINE   SPECIMEN:  No Specimen  DISPOSITION OF SPECIMEN:  N/A  COUNTS:  YES  TOURNIQUET:  * No tourniquets in log *  IMPLANTS: Bone cement  DICTATION: .Dragon Dictation patient was brought to the operating room and after adequate sedation was given, patient was placed prone.  C arm was brought in in good visualization and AP and lateral projections was obtained.  After patient identification and timeout procedures, 5 cc of 1% Xylocaine was infiltrated on both sides of T12.  After this the skin was prepped and draped in the usual sterile manner and repeat timeout procedure carried out.  A spinal needle was then used to get down to the pedicle on both sides with a 50-50 mix of 1% Xylocaine and half percent Sensorcaine with epinephrine.  A total of 20 cc injected on each side.  The right side was approached first through a small incision and the trocar advanced in an extrapedicular approach.  The biopsy cross the midline although no specimen was obtained and so a second access point was not required.  Drilling was carried out followed by inflation of the balloon to about 4 cc.  When the cement was the appropriate consistency was injected with a total of 6 cc getting good fill top to bottom left to right.  There was no extravasation.  When the cemented set trochars removed and permanent C arm views obtained.  The skin was closed with Dermabond followed by Band-Aid  PLAN OF CARE: Discharge to home after PACU  PATIENT DISPOSITION:  PACU - hemodynamically  stable.

## 2018-08-19 NOTE — Anesthesia Post-op Follow-up Note (Signed)
Anesthesia QCDR form completed.        

## 2018-08-19 NOTE — Discharge Instructions (Addendum)
Take it easy today and tomorrow.  Resume more normal activities as tolerated starting on Friday.  Continue prior pain medicine as instructed.  Remove Band-Aid and okay to shower starting on Friday    AMBULATORY SURGERY  DISCHARGE INSTRUCTIONS   1) The drugs that you were given will stay in your system until tomorrow so for the next 24 hours you should not:  A) Drive an automobile B) Make any legal decisions C) Drink any alcoholic beverage   2) You may resume regular meals tomorrow.  Today it is better to start with liquids and gradually work up to solid foods.  You may eat anything you prefer, but it is better to start with liquids, then soup and crackers, and gradually work up to solid foods.   3) Please notify your doctor immediately if you have any unusual bleeding, trouble breathing, redness and pain at the surgery site, drainage, fever, or pain not relieved by medication.    4) Additional Instructions:        Please contact your physician with any problems or Same Day Surgery at [phone], Monday through Friday 6 am to 4 pm, or [place] at [facility] number at [phone].
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
## 2018-08-19 NOTE — H&P (Signed)
Reviewed paper H+P, will be scanned into chart. No changes noted.  

## 2018-08-19 NOTE — OR Nursing (Signed)
Dr. Rosita Kea in to see pt in postop earlier.

## 2018-08-19 NOTE — Transfer of Care (Signed)
Immediate Anesthesia Transfer of Care Note  Patient: Renee Walker  Procedure(s) Performed: KYPHOPLASTY- T12 (N/A Spine Thoracic)  Patient Location: PACU  Anesthesia Type:General  Level of Consciousness: awake  Airway & Oxygen Therapy: Patient Spontanous Breathing  Post-op Assessment: Report given to RN  Post vital signs: stable  Last Vitals:  Vitals Value Taken Time  BP 143/83 08/19/2018  3:02 PM  Temp    Pulse 80 08/19/2018  3:02 PM  Resp 11 08/19/2018  3:02 PM  SpO2 97 % 08/19/2018  3:02 PM  Vitals shown include unvalidated device data.  Last Pain:  Vitals:   08/19/18 1342  TempSrc:   PainSc: 4          Complications: No apparent anesthesia complications

## 2018-08-20 ENCOUNTER — Encounter: Payer: Self-pay | Admitting: Orthopedic Surgery

## 2018-08-20 NOTE — Anesthesia Postprocedure Evaluation (Signed)
Anesthesia Post Note  Patient: Sarahanne Novakowski  Procedure(s) Performed: KYPHOPLASTY- T12 (N/A Spine Thoracic)  Patient location during evaluation: PACU Anesthesia Type: General Level of consciousness: awake and alert and oriented Pain management: pain level controlled Vital Signs Assessment: post-procedure vital signs reviewed and stable Respiratory status: spontaneous breathing, nonlabored ventilation and respiratory function stable Cardiovascular status: blood pressure returned to baseline and stable Postop Assessment: no signs of nausea or vomiting Anesthetic complications: no     Last Vitals:  Vitals:   08/19/18 1629 08/19/18 1654  BP: (!) 109/52 (!) 102/50  Pulse: 70 68  Resp: 18 18  Temp: 36.6 C   SpO2: 94% 97%    Last Pain:  Vitals:   08/20/18 0832  TempSrc:   PainSc: 4                  Jesseca Marsch

## 2018-09-10 ENCOUNTER — Emergency Department: Payer: Medicare Other

## 2018-09-10 ENCOUNTER — Emergency Department
Admission: EM | Admit: 2018-09-10 | Discharge: 2018-09-10 | Disposition: A | Discharge status: Home / Self Care | Payer: Medicare Other | Attending: Emergency Medicine | Admitting: Emergency Medicine

## 2018-09-10 ENCOUNTER — Other Ambulatory Visit: Payer: Self-pay

## 2018-09-10 ENCOUNTER — Encounter: Payer: Self-pay | Admitting: Intensive Care

## 2018-09-10 DIAGNOSIS — M545 Low back pain: Secondary | ICD-10-CM | POA: Diagnosis not present

## 2018-09-10 DIAGNOSIS — S299XXA Unspecified injury of thorax, initial encounter: Secondary | ICD-10-CM | POA: Diagnosis present

## 2018-09-10 DIAGNOSIS — Z7982 Long term (current) use of aspirin: Secondary | ICD-10-CM | POA: Insufficient documentation

## 2018-09-10 DIAGNOSIS — Y999 Unspecified external cause status: Secondary | ICD-10-CM | POA: Insufficient documentation

## 2018-09-10 DIAGNOSIS — Y939 Activity, unspecified: Secondary | ICD-10-CM | POA: Diagnosis not present

## 2018-09-10 DIAGNOSIS — Z79899 Other long term (current) drug therapy: Secondary | ICD-10-CM | POA: Diagnosis not present

## 2018-09-10 DIAGNOSIS — E039 Hypothyroidism, unspecified: Secondary | ICD-10-CM | POA: Insufficient documentation

## 2018-09-10 DIAGNOSIS — Z9884 Bariatric surgery status: Secondary | ICD-10-CM | POA: Diagnosis not present

## 2018-09-10 DIAGNOSIS — Z96611 Presence of right artificial shoulder joint: Secondary | ICD-10-CM | POA: Insufficient documentation

## 2018-09-10 DIAGNOSIS — Z96652 Presence of left artificial knee joint: Secondary | ICD-10-CM | POA: Diagnosis not present

## 2018-09-10 DIAGNOSIS — J45909 Unspecified asthma, uncomplicated: Secondary | ICD-10-CM | POA: Diagnosis not present

## 2018-09-10 DIAGNOSIS — S2231XA Fracture of one rib, right side, initial encounter for closed fracture: Secondary | ICD-10-CM | POA: Diagnosis not present

## 2018-09-10 DIAGNOSIS — W1830XA Fall on same level, unspecified, initial encounter: Secondary | ICD-10-CM | POA: Insufficient documentation

## 2018-09-10 DIAGNOSIS — Y929 Unspecified place or not applicable: Secondary | ICD-10-CM | POA: Insufficient documentation

## 2018-09-10 DIAGNOSIS — R109 Unspecified abdominal pain: Secondary | ICD-10-CM | POA: Insufficient documentation

## 2018-09-10 DIAGNOSIS — M549 Dorsalgia, unspecified: Secondary | ICD-10-CM

## 2018-09-10 HISTORY — DX: Unspecified atrial fibrillation: I48.91

## 2018-09-10 LAB — URINALYSIS, COMPLETE (UACMP) WITH MICROSCOPIC
BILIRUBIN URINE: NEGATIVE
Bacteria, UA: NONE SEEN
Glucose, UA: NEGATIVE mg/dL
Hgb urine dipstick: NEGATIVE
Ketones, ur: NEGATIVE mg/dL
Leukocytes, UA: NEGATIVE
NITRITE: NEGATIVE
PH: 5 (ref 5.0–8.0)
Protein, ur: NEGATIVE mg/dL
SPECIFIC GRAVITY, URINE: 1.028 (ref 1.005–1.030)

## 2018-09-10 LAB — CBC WITH DIFFERENTIAL/PLATELET
Abs Immature Granulocytes: 0.05 10*3/uL (ref 0.00–0.07)
BASOS ABS: 0.1 10*3/uL (ref 0.0–0.1)
BASOS PCT: 1 %
EOS ABS: 0 10*3/uL (ref 0.0–0.5)
Eosinophils Relative: 0 %
HCT: 35.9 % — ABNORMAL LOW (ref 36.0–46.0)
Hemoglobin: 11.2 g/dL — ABNORMAL LOW (ref 12.0–15.0)
IMMATURE GRANULOCYTES: 1 %
Lymphocytes Relative: 13 %
Lymphs Abs: 1.2 10*3/uL (ref 0.7–4.0)
MCH: 31.5 pg (ref 26.0–34.0)
MCHC: 31.2 g/dL (ref 30.0–36.0)
MCV: 101.1 fL — ABNORMAL HIGH (ref 80.0–100.0)
Monocytes Absolute: 0.4 10*3/uL (ref 0.1–1.0)
Monocytes Relative: 5 %
NRBC: 0 % (ref 0.0–0.2)
Neutro Abs: 7.6 10*3/uL (ref 1.7–7.7)
Neutrophils Relative %: 80 %
PLATELETS: 310 10*3/uL (ref 150–400)
RBC: 3.55 MIL/uL — ABNORMAL LOW (ref 3.87–5.11)
RDW: 15.9 % — AB (ref 11.5–15.5)
WBC: 9.3 10*3/uL (ref 4.0–10.5)

## 2018-09-10 LAB — BASIC METABOLIC PANEL
ANION GAP: 8 (ref 5–15)
BUN: 15 mg/dL (ref 8–23)
CALCIUM: 8.1 mg/dL — AB (ref 8.9–10.3)
CO2: 25 mmol/L (ref 22–32)
Chloride: 108 mmol/L (ref 98–111)
Creatinine, Ser: 0.74 mg/dL (ref 0.44–1.00)
Glucose, Bld: 96 mg/dL (ref 70–99)
Potassium: 4.6 mmol/L (ref 3.5–5.1)
Sodium: 141 mmol/L (ref 135–145)

## 2018-09-10 LAB — HEPATIC FUNCTION PANEL
ALBUMIN: 2.7 g/dL — AB (ref 3.5–5.0)
ALT: 26 U/L (ref 0–44)
AST: 33 U/L (ref 15–41)
Alkaline Phosphatase: 114 U/L (ref 38–126)
Bilirubin, Direct: <0.1 mg/dL (ref 0.0–0.2)
TOTAL PROTEIN: 5.1 g/dL — AB (ref 6.5–8.1)
Total Bilirubin: 0.5 mg/dL (ref 0.3–1.2)

## 2018-09-10 LAB — LIPASE, BLOOD: Lipase: 18 U/L (ref 11–51)

## 2018-09-10 MED ORDER — MORPHINE SULFATE (PF) 4 MG/ML IV SOLN
INTRAVENOUS | Status: AC
  Administered 2018-09-10: 4 mg via INTRAVENOUS
  Filled 2018-09-10: qty 1

## 2018-09-10 MED ORDER — OXYCODONE-ACETAMINOPHEN 5-325 MG PO TABS
1.0000 | ORAL_TABLET | Freq: Once | ORAL | Status: AC
  Administered 2018-09-10: 1 via ORAL
  Filled 2018-09-10: qty 1

## 2018-09-10 MED ORDER — OXYCODONE-ACETAMINOPHEN 5-325 MG PO TABS: 1.0000 | ORAL_TABLET | Freq: Four times a day (QID) | ORAL | 0 refills | Status: AC | PRN

## 2018-09-10 MED ORDER — KETOROLAC TROMETHAMINE 30 MG/ML IJ SOLN
30.0000 mg | Freq: Once | INTRAMUSCULAR | Status: AC
  Administered 2018-09-10: 30 mg via INTRAVENOUS

## 2018-09-10 MED ORDER — ONDANSETRON HCL 4 MG/2ML IJ SOLN
4.0000 mg | Freq: Once | INTRAMUSCULAR | Status: AC
  Administered 2018-09-10: 4 mg via INTRAVENOUS

## 2018-09-10 MED ORDER — ONDANSETRON HCL 4 MG/2ML IJ SOLN
INTRAMUSCULAR | Status: AC
  Administered 2018-09-10: 4 mg via INTRAVENOUS
  Filled 2018-09-10: qty 2

## 2018-09-10 MED ORDER — SODIUM CHLORIDE 0.9 % IV BOLUS
500.0000 mL | Freq: Once | INTRAVENOUS | Status: AC
  Administered 2018-09-10: 500 mL via INTRAVENOUS

## 2018-09-10 MED ORDER — IOPAMIDOL (ISOVUE-300) INJECTION 61%
100.0000 mL | Freq: Once | INTRAVENOUS | Status: AC | PRN
  Administered 2018-09-10: 100 mL via INTRAVENOUS

## 2018-09-10 MED ORDER — MORPHINE SULFATE (PF) 4 MG/ML IV SOLN
4.0000 mg | Freq: Once | INTRAVENOUS | Status: AC
  Administered 2018-09-10: 4 mg via INTRAVENOUS

## 2018-09-10 MED ORDER — KETOROLAC TROMETHAMINE 30 MG/ML IJ SOLN
INTRAMUSCULAR | Status: AC
  Administered 2018-09-10: 30 mg via INTRAVENOUS
  Filled 2018-09-10: qty 1

## 2018-09-10 NOTE — Discharge Instructions (Addendum)
Take the prescribed pain medication as needed, but transition to over-the-counter Tylenol as soon as you are able to.  Return to the ER for new, worsening, persistent severe pain, shortness of breath, weakness, cough, fever, or any other new or worsening symptoms that concern you.

## 2018-09-10 NOTE — ED Triage Notes (Signed)
Patient c/o constant right sided flank that radiates to front X1 week. Reports having back surgery X 2-3 weeks ago and saw MD recently who reported everything is healing properly. Denies urinary problems. Denies N/V

## 2018-09-10 NOTE — ED Notes (Signed)
Patient transported to CT 
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
## 2018-09-23 ENCOUNTER — Emergency Department: Payer: Medicare Other

## 2018-09-23 ENCOUNTER — Observation Stay
Admission: EM | Admit: 2018-09-23 | Discharge: 2018-09-24 | Disposition: A | Discharge status: Home / Self Care | Payer: Medicare Other | Attending: Internal Medicine | Admitting: Internal Medicine

## 2018-09-23 ENCOUNTER — Other Ambulatory Visit: Payer: Medicare Other

## 2018-09-23 DIAGNOSIS — I350 Nonrheumatic aortic (valve) stenosis: Secondary | ICD-10-CM | POA: Insufficient documentation

## 2018-09-23 DIAGNOSIS — H409 Unspecified glaucoma: Secondary | ICD-10-CM | POA: Insufficient documentation

## 2018-09-23 DIAGNOSIS — M359 Systemic involvement of connective tissue, unspecified: Secondary | ICD-10-CM | POA: Insufficient documentation

## 2018-09-23 DIAGNOSIS — E039 Hypothyroidism, unspecified: Secondary | ICD-10-CM | POA: Insufficient documentation

## 2018-09-23 DIAGNOSIS — I10 Essential (primary) hypertension: Secondary | ICD-10-CM | POA: Insufficient documentation

## 2018-09-23 DIAGNOSIS — R262 Difficulty in walking, not elsewhere classified: Secondary | ICD-10-CM | POA: Insufficient documentation

## 2018-09-23 DIAGNOSIS — M3320 Polymyositis, organ involvement unspecified: Secondary | ICD-10-CM | POA: Insufficient documentation

## 2018-09-23 DIAGNOSIS — I341 Nonrheumatic mitral (valve) prolapse: Secondary | ICD-10-CM | POA: Insufficient documentation

## 2018-09-23 DIAGNOSIS — R51 Headache: Secondary | ICD-10-CM | POA: Insufficient documentation

## 2018-09-23 DIAGNOSIS — M810 Age-related osteoporosis without current pathological fracture: Secondary | ICD-10-CM | POA: Insufficient documentation

## 2018-09-23 DIAGNOSIS — I493 Ventricular premature depolarization: Secondary | ICD-10-CM

## 2018-09-23 DIAGNOSIS — R9431 Abnormal electrocardiogram [ECG] [EKG]: Secondary | ICD-10-CM

## 2018-09-23 DIAGNOSIS — Z9884 Bariatric surgery status: Secondary | ICD-10-CM | POA: Insufficient documentation

## 2018-09-23 DIAGNOSIS — K219 Gastro-esophageal reflux disease without esophagitis: Secondary | ICD-10-CM | POA: Insufficient documentation

## 2018-09-23 DIAGNOSIS — M25519 Pain in unspecified shoulder: Secondary | ICD-10-CM | POA: Insufficient documentation

## 2018-09-23 DIAGNOSIS — R072 Precordial pain: Secondary | ICD-10-CM

## 2018-09-23 DIAGNOSIS — R55 Syncope and collapse: Principal | ICD-10-CM | POA: Insufficient documentation

## 2018-09-23 DIAGNOSIS — K5190 Ulcerative colitis, unspecified, without complications: Secondary | ICD-10-CM | POA: Insufficient documentation

## 2018-09-23 DIAGNOSIS — R42 Dizziness and giddiness: Secondary | ICD-10-CM | POA: Insufficient documentation

## 2018-09-23 DIAGNOSIS — M7989 Other specified soft tissue disorders: Secondary | ICD-10-CM | POA: Insufficient documentation

## 2018-09-23 DIAGNOSIS — Z7982 Long term (current) use of aspirin: Secondary | ICD-10-CM | POA: Insufficient documentation

## 2018-09-23 DIAGNOSIS — Z86711 Personal history of pulmonary embolism: Secondary | ICD-10-CM | POA: Insufficient documentation

## 2018-09-23 LAB — CBC AND DIFFERENTIAL
Absolute NRBC: 0 10*3/uL (ref 0.00–0.00)
Basophils Absolute Automated: 0.05 10*3/uL (ref 0.00–0.08)
Basophils Automated: 0.5 %
Eosinophils Absolute Automated: 0 10*3/uL (ref 0.00–0.44)
Eosinophils Automated: 0 %
Hematocrit: 35.9 % (ref 34.7–43.7)
Hgb: 11.1 g/dL — ABNORMAL LOW (ref 11.4–14.8)
Immature Granulocytes Absolute: 0.05 10*3/uL (ref 0.00–0.07)
Immature Granulocytes: 0.5 %
Lymphocytes Absolute Automated: 1.16 10*3/uL (ref 0.42–3.22)
Lymphocytes Automated: 11.6 %
MCH: 31.6 pg (ref 25.1–33.5)
MCHC: 30.9 g/dL — ABNORMAL LOW (ref 31.5–35.8)
MCV: 102.3 fL — ABNORMAL HIGH (ref 78.0–96.0)
MPV: 10.5 fL (ref 8.9–12.5)
Monocytes Absolute Automated: 0.66 10*3/uL (ref 0.21–0.85)
Monocytes: 6.6 %
Neutrophils Absolute: 8.12 10*3/uL — ABNORMAL HIGH (ref 1.10–6.33)
Neutrophils: 80.8 %
Nucleated RBC: 0 /100 WBC (ref 0.0–0.0)
Platelets: 217 10*3/uL (ref 142–346)
RBC: 3.51 10*6/uL — ABNORMAL LOW (ref 3.90–5.10)
RDW: 15 % (ref 11–15)
WBC: 10.04 10*3/uL — ABNORMAL HIGH (ref 3.10–9.50)

## 2018-09-23 LAB — PT AND APTT
PT INR: 0.9 (ref 0.9–1.1)
PT: 12.2 s — ABNORMAL LOW (ref 12.6–15.0)
PTT: 22 s — ABNORMAL LOW (ref 23–37)

## 2018-09-23 LAB — COMPREHENSIVE METABOLIC PANEL
ALT: 25 U/L (ref 0–55)
AST (SGOT): 26 U/L (ref 5–34)
Albumin/Globulin Ratio: 1.1 (ref 0.9–2.2)
Albumin: 2.5 g/dL — ABNORMAL LOW (ref 3.5–5.0)
Alkaline Phosphatase: 122 U/L — ABNORMAL HIGH (ref 37–106)
Anion Gap: 8 (ref 5.0–15.0)
BUN: 12.5 mg/dL (ref 7.0–19.0)
Bilirubin, Total: 0.4 mg/dL (ref 0.2–1.2)
CO2: 26 mEq/L (ref 22–29)
Calcium: 7.9 mg/dL — ABNORMAL LOW (ref 8.5–10.5)
Chloride: 107 mEq/L (ref 100–111)
Creatinine: 0.6 mg/dL (ref 0.6–1.0)
Globulin: 2.3 g/dL (ref 2.0–3.6)
Glucose: 109 mg/dL — ABNORMAL HIGH (ref 70–100)
Potassium: 4.1 mEq/L (ref 3.5–5.1)
Protein, Total: 4.8 g/dL — ABNORMAL LOW (ref 6.0–8.3)
Sodium: 141 mEq/L (ref 136–145)

## 2018-09-23 LAB — TROPONIN I
Troponin I: 0.01 ng/mL (ref 0.00–0.05)
Troponin I: 0.01 ng/mL (ref 0.00–0.05)
Troponin I: 0.01 ng/mL (ref 0.00–0.05)

## 2018-09-23 LAB — URINALYSIS REFLEX TO MICROSCOPIC EXAM - REFLEX TO CULTURE
Bilirubin, UA: NEGATIVE
Blood, UA: NEGATIVE
Glucose, UA: NEGATIVE
Ketones UA: NEGATIVE
Leukocyte Esterase, UA: NEGATIVE
Nitrite, UA: NEGATIVE
Protein, UR: NEGATIVE
Specific Gravity UA: 1.025 (ref 1.001–1.035)
Urine pH: 5 (ref 5.0–8.0)
Urobilinogen, UA: NORMAL mg/dL (ref 0.2–2.0)

## 2018-09-23 LAB — MAGNESIUM: Magnesium: 1.7 mg/dL (ref 1.6–2.6)

## 2018-09-23 LAB — GFR: EGFR: >60

## 2018-09-23 MED ORDER — SULFASALAZINE 500 MG PO TABS
500.0000 mg | ORAL_TABLET | Freq: Two times a day (BID) | ORAL | Status: DC
Start: 2018-09-23 — End: 2018-09-24
  Administered 2018-09-23 – 2018-09-24 (×2): 500 mg via ORAL
  Filled 2018-09-23 (×3): qty 1

## 2018-09-23 MED ORDER — PANTOPRAZOLE SODIUM 40 MG PO TBEC
40.00 mg | DELAYED_RELEASE_TABLET | Freq: Every morning | ORAL | Status: DC
Start: 2018-09-23 — End: 2018-09-24
  Filled 2018-09-23: qty 1

## 2018-09-23 MED ORDER — LEVOTHYROXINE SODIUM 112 MCG PO TABS
112.0000 ug | ORAL_TABLET | Freq: Every day | ORAL | Status: DC
Start: 2018-09-24 — End: 2018-09-24
  Administered 2018-09-24: 06:00:00 112 ug via ORAL
  Filled 2018-09-23 (×2): qty 1

## 2018-09-23 MED ORDER — DOCUSATE SODIUM 100 MG PO CAPS
100.00 mg | ORAL_CAPSULE | Freq: Two times a day (BID) | ORAL | Status: DC
Start: 2018-09-23 — End: 2018-09-24
  Filled 2018-09-23 (×2): qty 1

## 2018-09-23 MED ORDER — ENOXAPARIN SODIUM 40 MG/0.4ML SC SOLN
40.00 mg | Freq: Every day | SUBCUTANEOUS | Status: DC
Start: 2018-09-23 — End: 2018-09-24
  Administered 2018-09-23: 21:00:00 40 mg via SUBCUTANEOUS
  Filled 2018-09-23: qty 0.4

## 2018-09-23 MED ORDER — IODIXANOL 320 MG/ML IV SOLN
100.00 mL | Freq: Once | INTRAVENOUS | Status: AC | PRN
Start: 2018-09-23 — End: 2018-09-23
  Administered 2018-09-23: 13:00:00 100 mL via INTRAVENOUS

## 2018-09-23 MED ORDER — ACETAMINOPHEN 500 MG PO TABS
1000.0000 mg | ORAL_TABLET | Freq: Two times a day (BID) | ORAL | Status: DC
Start: 2018-09-23 — End: 2018-09-24
  Administered 2018-09-23: 18:00:00 1000 mg via ORAL
  Filled 2018-09-23 (×2): qty 2

## 2018-09-23 MED ORDER — HYDROMORPHONE HCL 2 MG PO TABS
2.0000 mg | ORAL_TABLET | ORAL | Status: DC | PRN
Start: 2018-09-23 — End: 2018-09-24

## 2018-09-23 MED ORDER — ESCITALOPRAM OXALATE 10 MG PO TABS
20.0000 mg | ORAL_TABLET | Freq: Every morning | ORAL | Status: DC
Start: 2018-09-23 — End: 2018-09-24
  Administered 2018-09-24: 09:00:00 20 mg via ORAL
  Filled 2018-09-23: qty 2
  Filled 2018-09-23: qty 1

## 2018-09-23 MED ORDER — ASPIRIN 81 MG PO TBEC
81.00 mg | DELAYED_RELEASE_TABLET | Freq: Every morning | ORAL | Status: DC
Start: 2018-09-23 — End: 2018-09-24
  Administered 2018-09-23 – 2018-09-24 (×2): 81 mg via ORAL
  Filled 2018-09-23 (×2): qty 1

## 2018-09-23 MED ORDER — AMITRIPTYLINE HCL 25 MG PO TABS
25.0000 mg | ORAL_TABLET | Freq: Every evening | ORAL | Status: DC
Start: 2018-09-23 — End: 2018-09-23

## 2018-09-23 MED ORDER — MESALAMINE 400 MG PO CPDR
800.00 mg | DELAYED_RELEASE_CAPSULE | Freq: Three times a day (TID) | ORAL | Status: DC
Start: 2018-09-23 — End: 2018-09-24
  Administered 2018-09-23: 23:00:00 400 mg via ORAL
  Administered 2018-09-24: 15:00:00 800 mg via ORAL
  Filled 2018-09-23 (×5): qty 2

## 2018-09-23 MED ORDER — ONDANSETRON 4 MG PO TBDP
4.00 mg | ORAL_TABLET | Freq: Four times a day (QID) | ORAL | Status: DC | PRN
Start: 2018-09-23 — End: 2018-09-24

## 2018-09-23 MED ORDER — CARVEDILOL 3.125 MG PO TABS
3.1250 mg | ORAL_TABLET | Freq: Two times a day (BID) | ORAL | Status: DC
Start: 2018-09-23 — End: 2018-09-24
  Administered 2018-09-23 – 2018-09-24 (×2): 3.125 mg via ORAL
  Filled 2018-09-23 (×4): qty 1

## 2018-09-23 MED ORDER — ACETAMINOPHEN 325 MG PO TABS
650.0000 mg | ORAL_TABLET | Freq: Four times a day (QID) | ORAL | Status: DC | PRN
Start: 2018-09-23 — End: 2018-09-24

## 2018-09-23 MED ORDER — LISINOPRIL 5 MG PO TABS
10.0000 mg | ORAL_TABLET | Freq: Every morning | ORAL | Status: DC
Start: 2018-09-23 — End: 2018-09-24
  Administered 2018-09-24: 09:00:00 10 mg via ORAL
  Filled 2018-09-23: qty 2

## 2018-09-23 MED ORDER — MELATONIN 3 MG PO TABS
6.0000 mg | ORAL_TABLET | Freq: Every evening | ORAL | Status: DC
Start: 2018-09-24 — End: 2018-09-24
  Administered 2018-09-23: 6 mg via ORAL
  Filled 2018-09-23: qty 2

## 2018-09-23 MED ORDER — PREDNISONE 5 MG PO TABS
5.0000 mg | ORAL_TABLET | Freq: Every day | ORAL | Status: DC
Start: 2018-09-23 — End: 2018-09-24
  Administered 2018-09-24: 11:00:00 5 mg via ORAL
  Filled 2018-09-23: qty 1

## 2018-09-23 MED ORDER — SODIUM CHLORIDE 0.9 % IV SOLN
INTRAVENOUS | Status: DC
Start: 2018-09-23 — End: 2018-09-24

## 2018-09-23 MED ORDER — ONDANSETRON HCL 4 MG/2ML IJ SOLN
4.00 mg | Freq: Four times a day (QID) | INTRAMUSCULAR | Status: DC | PRN
Start: 2018-09-23 — End: 2018-09-24

## 2018-09-23 MED ORDER — HYDROCORTISONE SOD SUC (PF) 100 MG IJ SOLR (WRAP)
100.00 mg | Freq: Once | INTRAMUSCULAR | Status: AC
Start: 2018-09-23 — End: 2018-09-23
  Administered 2018-09-23: 12:00:00 100 mg via INTRAVENOUS
  Filled 2018-09-23: qty 2

## 2018-09-23 MED ORDER — OXYCODONE-ACETAMINOPHEN 5-325 MG PO TABS
1.0000 | ORAL_TABLET | Freq: Four times a day (QID) | ORAL | Status: DC | PRN
Start: 2018-09-23 — End: 2018-09-24
  Administered 2018-09-23 – 2018-09-24 (×2): 1 via ORAL
  Filled 2018-09-23 (×2): qty 1

## 2018-09-23 MED ORDER — AMITRIPTYLINE HCL 25 MG PO TABS
25.0000 mg | ORAL_TABLET | Freq: Every evening | ORAL | Status: DC
Start: 2018-09-23 — End: 2018-09-24
  Administered 2018-09-23: 23:00:00 25 mg via ORAL
  Filled 2018-09-23 (×2): qty 1
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
## 2018-09-23 NOTE — UM Notes (Signed)
ER review     67y/o female came into ER on 12/11 and admitted OBS on same day for Near Syncope     Pnt with near syncope just pta. She says she was feeling well and got out of a chair to walk across the room. She felt like she was going to pass out. She felt weak all over. No focal weakness. Her husband thinks she was "unstable." She had some upper abd/chest pain yesterday and today. 3/10. Sharp. Lasts a few minutes and resolves. She feels nauseated and lightheaded. She has been helping her mother in [place] and said she had a similar episode this past month there. Pt has been taking prednisone for 6 weeks because of newly diagnosed PNR. She skipped it last night and this am.

## 2018-09-23 NOTE — ED Triage Notes (Signed)
Pt reports to ER BIBA c/o near syncopal episode PTA. Pt reports she stood up from a chair to go to the [employer] [employer] and felt like she was "going to pass out". Reports episodes of lightheadedness yesterday. C/o epigastric pain upon arrival. Denies at this time. -SOB. -numbness/tingling. -HA. PMH afib.  Is this injury related?: No   Weapon - Do you have any weapons/hazardous materials?: None   Vitals - BP: 131/63  Heart Rate: 81  Resp Rate: 14  Temp: 98.3 F (36.8 C)  Temp Source: Oral  SpO2: 99 %  O2 Device: None (Room air)  Pain Score: 0-No pain   GCS 15
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
## 2018-09-23 NOTE — ED Notes (Signed)
Nursing Communication - Admission Information:  Diagnosis:   1. Precordial pain     2. Near syncope       Mobility:  Ambulates with cane at home.  Has frozen ankle  Isolation: No active isolations  Patient comes from: Home  Neuro:  AOx4  Special needs:   Drips:

## 2018-09-24 LAB — ECG 12-LEAD
Atrial Rate: 70 {beats}/min
Atrial Rate: 77 {beats}/min
P Axis: 28 degrees
P-R Interval: 112 ms
P-R Interval: 130 ms
Q-T Interval: 390 ms
Q-T Interval: 392 ms
QRS Duration: 82 ms
QRS Duration: 88 ms
QTC Calculation (Bezet): 423 ms
QTC Calculation (Bezet): 441 ms
R Axis: 12 degrees
R Axis: 182 degrees
T Axis: 184 degrees
T Axis: 20 degrees
Ventricular Rate: 70 {beats}/min
Ventricular Rate: 77 {beats}/min

## 2018-09-24 LAB — GFR: EGFR: >60

## 2018-09-24 LAB — BASIC METABOLIC PANEL
Anion Gap: 7 (ref 5.0–15.0)
BUN: 11.7 mg/dL (ref 7.0–19.0)
CO2: 26 mEq/L (ref 22–29)
Calcium: 7.8 mg/dL — ABNORMAL LOW (ref 8.5–10.5)
Chloride: 108 mEq/L (ref 100–111)
Creatinine: 0.6 mg/dL (ref 0.6–1.0)
Glucose: 81 mg/dL (ref 70–100)
Potassium: 4.3 mEq/L (ref 3.5–5.1)
Sodium: 141 mEq/L (ref 136–145)

## 2018-09-24 MED ORDER — BUTALBITAL-APAP-CAFFEINE 50-325-40 MG PO TABS
1.0000 | ORAL_TABLET | Freq: Four times a day (QID) | ORAL | Status: DC | PRN
Start: 2018-09-24 — End: 2018-09-24
  Administered 2018-09-24: 1 via ORAL
  Filled 2018-09-24: qty 1

## 2018-09-24 MED ORDER — DIAZEPAM 2 MG PO TABS
2.0000 mg | ORAL_TABLET | Freq: Four times a day (QID) | ORAL | Status: DC | PRN
Start: 2018-09-24 — End: 2018-09-24
  Administered 2018-09-24: 17:00:00 2 mg via ORAL
  Filled 2018-09-24: qty 1

## 2018-09-24 MED ORDER — OXYCODONE-ACETAMINOPHEN 5-325 MG PO TABS
2.0000 | ORAL_TABLET | Freq: Four times a day (QID) | ORAL | Status: DC | PRN
Start: 2018-09-24 — End: 2018-09-24
  Administered 2018-09-24: 2 via ORAL
  Administered 2018-09-24: 1 via ORAL
  Filled 2018-09-24: qty 1
  Filled 2018-09-24: qty 2

## 2018-09-24 MED ORDER — BUTALBITAL-APAP-CAFFEINE 50-325-40 MG PO TABS
1.0000 | ORAL_TABLET | Freq: Four times a day (QID) | ORAL | 0 refills | Status: DC | PRN
Start: 2018-09-24 — End: 2020-06-16

## 2018-09-24 MED ORDER — DIAZEPAM 2 MG PO TABS
2.0000 mg | ORAL_TABLET | Freq: Two times a day (BID) | ORAL | 0 refills | Status: DC | PRN
Start: 2018-09-24 — End: 2023-08-21

## 2018-09-24 NOTE — Discharge Summary (Signed)
DISCHARGE NOTE      Date Time: 09/24/2018  5:56 PM  Patient Name:Leslie Gross  ZDG:[id number]  PCP: Christel Mormon, MD  Admit Date:09/23/2018  Discharge Date:09/24/2018  Attending Physician:Ashleyann Shoun M.D.    Hospital Course:   Please see H&P for complete details of HPI and ROS. The patient was admitted to [facility] and has been diagnosed with the following conditions and has been taken care as mentioned below.    Patient Active Problem List    Diagnosis Date Noted   . Syncope 09/23/2018   . Primary osteoarthritis of cervical spine 01/23/2018   . Cervicogenic headache 08/11/2017   . Chronic midline low back pain without sciatica 08/11/2017   . Neck pain 08/11/2017   . Compression fracture of L2 lumbar vertebra, closed, initial encounter 06/03/2017   Syncope--etiology likely vasovagal. Placed in observation. R/O MI protocol, place on telemetry and monitored. Cardiology consulted and cleared for [facility].    Recurrent HA--likely combination of stress and migraines. Will [place] home on Fioricet prn and valium      HTN: Continue home medications.  GERD:Patient continued on  PPI, will try GI cocktail if gets epigastric discomfort again. Patient to elevate head of bed, avoid meals prior to bedtime, avoid caffeine, and avoid spicy food. Follows with PCP outpatient.    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 Follows with GI outpatient.    Hypothyroidism: Continue Synthroid daily. Follows with PCP outpatient.    Depression: Patient reports this to be stable. Continue home medications. Follows with PCP outpatient.        Date of Admission:   09/23/2018  Date of Discharge:   09/24/2018  Chief Complaint:      Chief Complaint   Patient presents with   . Near syncope     Discharge Diagnosis:   Hospital Problems:  Active Problems:    Syncope    Lists the present on admission hospital problems  Present on Admission:  . Syncope    Consult Input/Plan   Plan   None  Procedures performed:   No  orders of the defined types were placed in this encounter.    Physical Exam:    height is 1.6 m (5\' 3" ) and weight is 77.5 kg (170 lb 13.7 oz). Her temperature is 97.3 F (36.3 C). Her blood pressure is 99/65 and her pulse is 74. Her respiration is 16 and oxygen saturation is 96%.   Body mass index is 30.27 kg/m.  Vitals:    09/24/18 0524 09/24/18 0902 09/24/18 1238 09/24/18 1648   BP: 107/66 119/75 97/64 99/65    Pulse: 79 85 93 74   Resp: 16 18 15 16    Temp: 97.9 F (36.6 C) 97.9 F (36.6 C) 97.2 F (36.2 C) 97.3 F (36.3 C)   TempSrc: Oral Temporal Temporal    SpO2: 98% 97% 94% 96%   Weight:       Height:         Intake and Output Summary (Last 24 hours) at Date Time    Intake/Output Summary (Last 24 hours) at 09/24/2018 1756  Last data filed at 09/23/2018 1800  Gross per 24 hour   Intake 240 ml   Output -   Net 240 ml     Labs:     Results     Procedure Component Value Units Date/Time    Basic Metabolic Panel [[id number]]  (Abnormal) Collected:  09/24/18 0658    Specimen:  Blood Updated:  09/24/18  0737     Glucose 81 mg/dL      BUN 54.0 mg/dL      Creatinine 0.6 mg/dL      Calcium 7.8 mg/dL      Sodium 981 mEq/L      Potassium 4.3 mEq/L      Chloride 108 mEq/L      CO2 26 mEq/L      Anion Gap 7.0    GFR [[id number]] Collected:  09/24/18 0658     Updated:  09/24/18 0737     EGFR >60.0    Troponin I [[id number]] Collected:  09/23/18 1911    Specimen:  Blood Updated:  09/23/18 1941     Troponin I 0.01 ng/mL         Rads:   Radiological Procedure reviewed.  Chest 2 Views    Result Date: 09/23/2018  HISTORY: Chest pain COMPARISON: 07/28/2018 FINDINGS: Heart size is normal. Blunting of the right costophrenic angle, likely pleural thickening is unchanged. Left lung is clear. No new focal consolidation. Partially imaged right shoulder replacement. Clips in the upper abdomen. Vertebroplasty cement in a lower thoracic spinal vertebral body, new from prior.      Stable blunting of the right costophrenic angle, likely  pleural thickening. Shelly Flatten, MD 09/23/2018 10:45 AM    Ct Head Wo Contrast    Result Date: 09/23/2018  INDICATION: Stroke, vertigo. TECHNIQUE: Axial noncontrast CT imaging through the head performed, with sagittal and coronal reformats reviewed. The following dose reduction techniques were utilized: automated exposure control and/or adjustment of the mA and/or kV according to patient size, and the use of iterative reconstruction technique. COMPARISON: 06/04/2017 CT head FINDINGS: No acute intracranial hemorrhage. No intracranial mass, mass effect or midline shift. The ventricles are within normal size limits for the degree of background mild global volume loss. Patchy periventricular and subcortical white matter hypoattenuation consistent with chronic small vessel ischemic changes. Gray-white matter differentiation is maintained. The posterior fossa is unremarkable. Atherosclerotic disease of the intracranial carotids. Mineralization in the bilateral basal ganglia. The orbits are unremarkable. The visualized paranasal sinuses and the bilateral mastoid air cells are clear. The skull base and calvarium are intact.     No CT evidence of acute intracanal abnormality. Mild global volume loss and chronic small vessel ischemic changes. MRI would be more sensitive for detection of acute infarct if clinically indicated. Gaylyn Rong, MD 09/23/2018 1:30 PM    Ct Angio Aaa Chest/ Abdomen    Result Date: 09/23/2018  INDICATION: Near syncope. PROCEDURE:  Contrast-enhanced CT of the chest and abdomen was performed using a standard CT angiography protocol.  100 mL of Visipaque 320 were administered intravenously.  Data were acquired in the axial plane using helical technique. Axial reconstructions and coronal and sagittal multiplanar reformatted images were generated. Reformatted images include maximum intensity projection images. FINDINGS: No aneurysm or dissection of the aorta. There is only a small amount of  calcified atherosclerotic plaque. Normal cardiac size. No pericardial effusion. The exam was not tailored for the evaluation of the pulmonary arteries. However, central pulmonary arteries is seen fairly well, and there are no central pulmonary emboli. Small foci of atelectasis are seen in the right lung base. No pneumothorax, consolidation, or pleural effusion. There is a right shoulder prosthesis. Gastric bypass and cholecystectomy are noted. Mild diverticulosis of the included portion of the colon. Included portions of the abdomen are otherwise unremarkable. T12 vertebral body fractures status post vertebroplasty. There is also a L2 vertebral body  fracture with unchanged deformity compared to previous CT of the lumbar spine in August 2018.      No aneurysm or dissection of the thoracic aorta. No acute abnormal findings. Wynema Birch, MD 09/23/2018 1:26 PM    Discharge Medications:        Discharge Medication List      Taking    acetaminophen 500 MG tablet  Dose:  1,000 mg  Commonly known as:  TYLENOL  Take 1,000 mg by mouth 2 (two) times daily     amitriptyline 25 MG tablet  Dose:  25 mg  Commonly known as:  ELAVIL  Take 25 mg by mouth nightly.     aspirin EC 81 MG EC tablet  Dose:  81 mg  Take 81 mg by mouth every morning      butalbital-acetaminophen-caffeine 50-325-40 MG per tablet  Dose:  1 tablet  Commonly known as:  FIORICET, ESGIC  Take 1 tablet by mouth every 6 (six) hours as needed for Pain or Headaches     CALCIUM + D PO  Dose:  2 tablet  Take 2 tablets by mouth Daily after lunch CALCIUM 500 MG  WITH VIT D 700IU     COREG CR 10 MG 24 hr capsule  Dose:  10 mg  Generic drug:  carvedilol  Take 10 mg by mouth every morning      cyclobenzaprine 5 MG tablet  Dose:  5 mg  Commonly known as:  FLEXERIL  Take 1 tablet (5 mg total) by mouth 3 (three) times daily as needed for Muscle spasms.     cycloSPORINE 0.05 % ophthalmic emulsion  Dose:  1 drop  Commonly known as:  RESTASIS  Place 1 drop into both eyes as  needed      diazePAM 2 MG tablet  Dose:  2 mg  Commonly known as:  VALIUM  Take 1 tablet (2 mg total) by mouth every 12 (twelve) hours as needed for Anxiety     escitalopram 20 MG tablet  Dose:  20 mg  Commonly known as:  LEXAPRO  Take 20 mg by mouth every morning      fluticasone 27.5 MCG/SPRAY nasal spray  Dose:  2 spray  Commonly known as:  VERAMYST  2 sprays by Nasal route nightly as needed      levothyroxine 112 MCG tablet  Commonly known as:  SYNTHROID, LEVOTHROID  Take by mouth Once a day at 6:00am     lisinopril 10 MG tablet  Dose:  10 mg  Commonly known as:  PRINIVIL,ZESTRIL  Take 10 mg by mouth every morning     mesalamine 1.2 g EC tablet  Dose:  4,800 mg  Commonly known as:  LIALDA  Take 4,800 mg by mouth every morning with breakfast      multivitamin tablet  Dose:  1 tablet  Take 1 tablet by mouth daily.     omeprazole 20 MG capsule  Dose:  20 mg  Commonly known as:  PriLOSEC  Take 20 mg by mouth daily      predniSONE 5 MG tablet  Dose:  5 mg  Commonly known as:  DELTASONE  Take 5 mg by mouth daily     PRESERVISION AREDS 2 PO  Dose:  2 tablet  Take 2 tablets by mouth every evening      sulfaSALAzine 500 MG EC tablet  Dose:  1 tablet  Commonly known as:  AZULFIDINE  Take 1 tablet by mouth 2 (two)  times daily     vitamin D (ergocalciferol) 50000 UNIT Caps  Dose:  50,000 Units  Commonly known as:  DRISDOL  Take 50,000 Units by mouth once a week EVERY MONDAY     vitamin E 400 UNIT capsule  Dose:  400 Units  Take 400 Units by mouth daily before lunch     VYZULTA 0.024 % Soln  Dose:  1 drop  Generic drug:  Latanoprostene Bunod  Apply 1 drop to eye nightly        STOP taking these medications    oxyCODONE-acetaminophen 5-325 MG per tablet  Commonly known as:  PERCOCET            Pending Labs:     Unresulted Labs     None         Discharge Destination:   home   Condition at Discharge :   Improved  Labs/Images to be followed at your PCP office   None  Follow-up:     Follow-up Information     Christel Mormon, MD  Follow up in 1 week(s).    Specialty:  Internal Medicine  Contact information:  [address]  [place] [address]  [phone]                  Recommended Follow up with PCP in one week.    Time spent for Discharge Care:        Signed by: Christel Mormon, MD

## 2018-09-24 NOTE — Plan of Care (Signed)
Patient is a high fall risk due to previous falls. She uses a wheelchair, but can ambulate with a walker to the bathroom. Patient awaiting Dr. Doyne Keel

## 2018-09-24 NOTE — Plan of Care (Signed)
Pt progressing with POC. No distress noted. Assessment complete  Pt resting in bed. Denies chest pain, dizziness, or lightheadedness. IV fluids infusing. Will monitor

## 2018-09-24 NOTE — Discharge Instr - AVS First Page (Signed)
Reason for your Hospital Admission:  Vasovagal syncope  Near-Fainting: Vagal Reaction  Fainting (syncope) is a temporary loss of consciousness (passing out). It is associated with a loss of postural tone. Postural tone is the constant contraction of the muscles in your body to help keep your body upright. It also helps blood return towards the heart and brain. Syncope occurs when there is reduced blood flow to the brain due to this common vagal reaction. A vagal reaction is a reflex response that causes a sudden drop in your blood pressure, and your pulse to slow down. If the pulse is low enough, the blood pressure falls and causes fainting or near-fainting. Lying down usually stops the reaction very quickly.  These are symptoms of near-fainting:   Feeling lightheaded or like you are going to faint   Weak pulse   Nausea   Sweating   Blurred vision or feeling like your vision is "blacking out"   Palpitations   Chest pain   Trouble breathing   Cool and clammy skin  Causes for near-fainting include:   Sudden emotional stress like fear, pain, panic, sight of blood   Straining or overexertion, straining while using the toilet, coughing, sneezing   Standing up too quickly, or standing up for too long a time   Pregnancy  Home care  The following will help you care for yourself at home:   Rest today and go back to your normal activities as soon as you are feeling back to normal.   If you become light-headed or dizzy, lie down right away or sit with your head lowered between your knees.   Stay hydrated and do not skip meals.   Don't stand for long periods or stay inhot places   Do what you can to prevent constipation. If you bear down excessively when trying to have a bowel movement, this can trigger a vagal response  There may be other causes for a vagal response and near-syncope. For example, this can happen after open-heart surgery when the heart muscle is inflamed and irritated.  Check with your  doctor to see if there is testing you need such as a tilt-table test, heart rhythm monitoring, or blood tests. Review the medicines you take with your healthcare provider and pharmacist to be sure the symptoms you have are not a side effect of a medicine.  Follow-up care  Follow up with your healthcare provider, or as advised.  If you are having frequent episodes of near-syncope or vagal reactions, be cautious about activities such as driving that could harm yourself or others if you were to faint. Do not drive or operate heavy machinery if you are feeling like you may faint.  Call 911  Call 911 if any of these occur:   Another fainting spell occurs, and it is not explained by the common causes listed above   Fainting or loss of consciousness   Chest, arm, neck, jaw, back, or abdominal pain   Shortness of breath   Weakness, tingling, or numbness in one side of the face, one arm or leg   Slurred speech, confusion, trouble walking or seeing   Seizure   Blood in vomit or stools (black or red color)  When to seek medical advice  Call your healthcare provider right away if you have occasional mild lightheadedness, especially when standing up.  [employer] last reviewed this educational content on 03/15/2015   2000-2019 [employer], [place]. [address] [place], [place] [address]. All rights  reserved. This information is not intended as a substitute for professional medical care. Always follow your healthcare professional's instructions.

## 2018-09-24 NOTE — Progress Notes (Signed)
09/23/18 2227 09/23/18 2229 09/23/18 2231   Vital Signs   Heart Rate 73 79 84   Resp Rate 16 16 16    BP 128/75 110/71 115/72   BP Location Left arm Left arm Left arm   MAP (mmHg) 93 84 86   Patient Position Lying Sitting Standing     Orthostatic BPs
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
## 2018-10-11 ENCOUNTER — Emergency Department
Admission: EM | Admit: 2018-10-11 | Discharge: 2018-10-11 | Disposition: A | Discharge status: Home / Self Care | Payer: Medicare Other | Attending: Emergency Medicine | Admitting: Emergency Medicine

## 2018-10-11 ENCOUNTER — Emergency Department: Payer: Medicare Other

## 2018-10-11 DIAGNOSIS — R9389 Abnormal findings on diagnostic imaging of other specified body structures: Secondary | ICD-10-CM

## 2018-10-11 DIAGNOSIS — S29011A Strain of muscle and tendon of front wall of thorax, initial encounter: Secondary | ICD-10-CM | POA: Insufficient documentation

## 2018-10-11 DIAGNOSIS — I4891 Unspecified atrial fibrillation: Secondary | ICD-10-CM | POA: Insufficient documentation

## 2018-10-11 DIAGNOSIS — Z9049 Acquired absence of other specified parts of digestive tract: Secondary | ICD-10-CM | POA: Insufficient documentation

## 2018-10-11 DIAGNOSIS — S39011A Strain of muscle, fascia and tendon of abdomen, initial encounter: Secondary | ICD-10-CM | POA: Insufficient documentation

## 2018-10-11 DIAGNOSIS — Z8719 Personal history of other diseases of the digestive system: Secondary | ICD-10-CM | POA: Insufficient documentation

## 2018-10-11 DIAGNOSIS — Z86711 Personal history of pulmonary embolism: Secondary | ICD-10-CM | POA: Insufficient documentation

## 2018-10-11 DIAGNOSIS — I341 Nonrheumatic mitral (valve) prolapse: Secondary | ICD-10-CM | POA: Insufficient documentation

## 2018-10-11 DIAGNOSIS — E039 Hypothyroidism, unspecified: Secondary | ICD-10-CM | POA: Insufficient documentation

## 2018-10-11 DIAGNOSIS — X500XXA Overexertion from strenuous movement or load, initial encounter: Secondary | ICD-10-CM | POA: Insufficient documentation

## 2018-10-11 DIAGNOSIS — Z8739 Personal history of other diseases of the musculoskeletal system and connective tissue: Secondary | ICD-10-CM | POA: Insufficient documentation

## 2018-10-11 DIAGNOSIS — Z9884 Bariatric surgery status: Secondary | ICD-10-CM | POA: Insufficient documentation

## 2018-10-11 DIAGNOSIS — Z7982 Long term (current) use of aspirin: Secondary | ICD-10-CM | POA: Insufficient documentation

## 2018-10-11 LAB — CBC AND DIFFERENTIAL
Absolute NRBC: 0 10*3/uL (ref 0.00–0.00)
Basophils Absolute Automated: 0.07 10*3/uL (ref 0.00–0.08)
Basophils Automated: 0.9 %
Eosinophils Absolute Automated: 0.05 10*3/uL (ref 0.00–0.44)
Eosinophils Automated: 0.6 %
Hematocrit: 35.6 % (ref 34.7–43.7)
Hgb: 10.9 g/dL — ABNORMAL LOW (ref 11.4–14.8)
Immature Granulocytes Absolute: 0.03 10*3/uL (ref 0.00–0.07)
Immature Granulocytes: 0.4 %
Lymphocytes Absolute Automated: 1.41 10*3/uL (ref 0.42–3.22)
Lymphocytes Automated: 18.1 %
MCH: 31.8 pg (ref 25.1–33.5)
MCHC: 30.6 g/dL — ABNORMAL LOW (ref 31.5–35.8)
MCV: 103.8 fL — ABNORMAL HIGH (ref 78.0–96.0)
MPV: 10.8 fL (ref 8.9–12.5)
Monocytes Absolute Automated: 0.44 10*3/uL (ref 0.21–0.85)
Monocytes: 5.6 %
Neutrophils Absolute: 5.79 10*3/uL (ref 1.10–6.33)
Neutrophils: 74.4 %
Nucleated RBC: 0 /100 WBC (ref 0.0–0.0)
Platelets: 226 10*3/uL (ref 142–346)
RBC: 3.43 10*6/uL — ABNORMAL LOW (ref 3.90–5.10)
RDW: 14 % (ref 11–15)
WBC: 7.79 10*3/uL (ref 3.10–9.50)

## 2018-10-11 LAB — COMPREHENSIVE METABOLIC PANEL
ALT: 19 U/L (ref 0–55)
AST (SGOT): 23 U/L (ref 5–34)
Albumin/Globulin Ratio: 1.2 (ref 0.9–2.2)
Albumin: 2.9 g/dL — ABNORMAL LOW (ref 3.5–5.0)
Alkaline Phosphatase: 118 U/L — ABNORMAL HIGH (ref 37–106)
Anion Gap: 7 (ref 5.0–15.0)
BUN: 11.4 mg/dL (ref 7.0–19.0)
Bilirubin, Total: 0.3 mg/dL (ref 0.2–1.2)
CO2: 25 mEq/L (ref 22–29)
Calcium: 8.2 mg/dL — ABNORMAL LOW (ref 8.5–10.5)
Chloride: 109 mEq/L (ref 100–111)
Creatinine: 0.6 mg/dL (ref 0.6–1.0)
Globulin: 2.5 g/dL (ref 2.0–3.6)
Glucose: 98 mg/dL (ref 70–100)
Potassium: 4.2 mEq/L (ref 3.5–5.1)
Protein, Total: 5.4 g/dL — ABNORMAL LOW (ref 6.0–8.3)
Sodium: 141 mEq/L (ref 136–145)

## 2018-10-11 LAB — URINALYSIS REFLEX TO MICROSCOPIC EXAM - REFLEX TO CULTURE
Bilirubin, UA: NEGATIVE
Blood, UA: NEGATIVE
Glucose, UA: NEGATIVE
Ketones UA: NEGATIVE
Leukocyte Esterase, UA: NEGATIVE
Nitrite, UA: NEGATIVE
Protein, UR: NEGATIVE
Specific Gravity UA: 1.029 (ref 1.001–1.035)
Urine pH: 5 (ref 5.0–8.0)
Urobilinogen, UA: NORMAL mg/dL (ref 0.2–2.0)

## 2018-10-11 LAB — MAGNESIUM: Magnesium: 1.7 mg/dL (ref 1.6–2.6)

## 2018-10-11 LAB — LIPASE: Lipase: <4 U/L — ABNORMAL LOW (ref 8–78)

## 2018-10-11 LAB — GFR: EGFR: >60

## 2018-10-11 MED ORDER — IOHEXOL 350 MG/ML IV SOLN
100.00 mL | Freq: Once | INTRAVENOUS | Status: AC | PRN
Start: 2018-10-11 — End: 2018-10-11
  Administered 2018-10-11: 12:00:00 100 mL via INTRAVENOUS

## 2018-10-11 MED ORDER — ONDANSETRON HCL 4 MG/2ML IJ SOLN
4.00 mg | Freq: Once | INTRAMUSCULAR | Status: AC
Start: 2018-10-11 — End: 2018-10-11
  Administered 2018-10-11: 11:00:00 4 mg via INTRAVENOUS
  Filled 2018-10-11: qty 2

## 2018-10-11 MED ORDER — HYDROCODONE-ACETAMINOPHEN 5-325 MG PO TABS
1.0000 | ORAL_TABLET | ORAL | 0 refills | Status: DC | PRN
Start: 2018-10-11 — End: 2020-06-16

## 2018-10-11 MED ORDER — FENTANYL CITRATE (PF) 50 MCG/ML IJ SOLN (WRAP)
50.00 ug | Freq: Once | INTRAMUSCULAR | Status: DC
Start: 2018-10-11 — End: 2018-10-11

## 2018-10-11 MED ORDER — NALOXONE HCL 4 MG/0.1ML NA LIQD
NASAL | 0 refills | Status: DC
Start: 2018-10-11 — End: 2020-11-14

## 2018-10-11 MED ORDER — KETOROLAC TROMETHAMINE 15 MG/ML IJ SOLN
15.00 mg | Freq: Once | INTRAMUSCULAR | Status: DC
Start: 2018-10-11 — End: 2018-10-11

## 2018-10-11 MED ORDER — HYDROCODONE-ACETAMINOPHEN 5-325 MG PO TABS
1.0000 | ORAL_TABLET | Freq: Four times a day (QID) | ORAL | 0 refills | Status: DC | PRN
Start: 2018-10-11 — End: 2018-10-11

## 2018-10-11 MED ORDER — LIDOCAINE 5 % EX PTCH
1.00 | MEDICATED_PATCH | CUTANEOUS | 0 refills | Status: DC
Start: 2018-10-11 — End: 2020-11-14

## 2018-10-11 MED ORDER — MORPHINE SULFATE 4 MG/ML IJ/IV SOLN (WRAP)
4.0000 mg | Freq: Once | Status: AC
Start: 2018-10-11 — End: 2018-10-11
  Administered 2018-10-11: 11:00:00 4 mg via INTRAVENOUS
  Filled 2018-10-11: qty 1
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
## 2018-10-11 NOTE — Discharge Instructions (Addendum)
Ice to area.  Caution drowsiness with pain medication given.  Follow up with your primary care physician.  Continue valium as needed for spasm.  Use lidocaine patches in areas of most pain.  Follow up with your primary care physician for CT scan findings and follow up

## 2018-11-03 ENCOUNTER — Ambulatory Visit: Payer: Medicare Other

## 2018-11-05 ENCOUNTER — Ambulatory Visit: Payer: Medicare Other

## 2018-11-10 ENCOUNTER — Ambulatory Visit: Payer: Medicare Other

## 2018-11-12 ENCOUNTER — Ambulatory Visit: Payer: Medicare Other

## 2018-11-17 ENCOUNTER — Ambulatory Visit: Payer: Medicare Other

## 2018-11-19 ENCOUNTER — Ambulatory Visit: Payer: Medicare Other

## 2018-11-24 ENCOUNTER — Ambulatory Visit: Payer: Medicare Other

## 2018-11-26 ENCOUNTER — Ambulatory Visit: Payer: Medicare Other

## 2018-12-01 ENCOUNTER — Ambulatory Visit: Payer: Medicare Other

## 2018-12-01 ENCOUNTER — Other Ambulatory Visit: Payer: Self-pay

## 2018-12-03 ENCOUNTER — Ambulatory Visit: Payer: Medicare Other

## 2018-12-08 ENCOUNTER — Ambulatory Visit: Payer: Medicare Other

## 2018-12-10 ENCOUNTER — Ambulatory Visit: Payer: Medicare Other

## 2018-12-15 ENCOUNTER — Ambulatory Visit: Payer: Medicare Other

## 2018-12-17 ENCOUNTER — Ambulatory Visit: Payer: Medicare Other

## 2018-12-18 ENCOUNTER — Ambulatory Visit: Payer: Medicare Other | Attending: Orthopaedic Surgery

## 2018-12-18 DIAGNOSIS — M5382 Other specified dorsopathies, cervical region: Secondary | ICD-10-CM | POA: Insufficient documentation

## 2018-12-18 DIAGNOSIS — M5020 Other cervical disc displacement, unspecified cervical region: Secondary | ICD-10-CM | POA: Insufficient documentation

## 2018-12-18 DIAGNOSIS — M5010 Cervical disc disorder with radiculopathy, unspecified cervical region: Secondary | ICD-10-CM | POA: Insufficient documentation

## 2018-12-18 DIAGNOSIS — Z7409 Other reduced mobility: Secondary | ICD-10-CM | POA: Insufficient documentation

## 2018-12-18 DIAGNOSIS — M4712 Other spondylosis with myelopathy, cervical region: Secondary | ICD-10-CM

## 2018-12-18 DIAGNOSIS — M5000 Cervical disc disorder with myelopathy, unspecified cervical region: Secondary | ICD-10-CM | POA: Insufficient documentation

## 2018-12-18 DIAGNOSIS — M5412 Radiculopathy, cervical region: Secondary | ICD-10-CM

## 2018-12-18 DIAGNOSIS — M542 Cervicalgia: Secondary | ICD-10-CM | POA: Insufficient documentation

## 2018-12-18 DIAGNOSIS — M4802 Spinal stenosis, cervical region: Secondary | ICD-10-CM

## 2018-12-18 HISTORY — DX: Other cervical disc displacement, unspecified cervical region: M50.20

## 2018-12-18 HISTORY — DX: Radiculopathy, cervical region: M54.12

## 2018-12-18 HISTORY — DX: Spinal stenosis, cervical region: M48.02

## 2018-12-18 HISTORY — DX: Other spondylosis with myelopathy, cervical region: M47.12

## 2018-12-18 NOTE — Plan of Care (Signed)
[facility]  [address]  [place], [place]  [address]  Phone:  [phone]  Fax:  [phone]    PHYSICAL THERAPY EVALUATION AND PLAN OF CARE           Referred By: Shane Crutch, MD    *I agree to the plan of care stated below*                                                                                                                                           Physician Signature      Date      PATIENT: Leslie Gross DOB: 1950/12/26   MR #: [id number]  AGE: 68 y.o.    FACILITY PROVIDER #: [id number] PRIMARY MD: Christel Mormon, MD    HICN# Medicare Sub. 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]        Date of Service PT Received On: 12/18/18   Treatment Time Start Time: 0900 to Stop Time: 1000   Time Calculation Time Calculation (min): 60 min   Visit # PT Visit  PT Visit Number: 1/25   Units Billed PT Evaluation  $ PT Evaluation Low Complexity (97161): 1 Procedure(30 mins) Therapeutic Interventions  $ PT Ultrasound (32440): 1 Units(8 mins)  $ PT Therapeutic Exercise (97110): 1 Unit(14 mins)  $ PT Manual Therapy (97140): 1 Unit(8 mins)     Certification Period:  12/18/18 to 02/17/19    Treatment Diagnosis:  Neck Pain [M54.2], Painful Cervical ROM [M54.2] and Impaired Mobility [Z74.09]    Date of onset: chronic  Script dated 12/17/18    Imaging Performed:  Not at this time    Precautions: hx of L2 fracture ~2 years ago  LUE biceps tear, RC tear  FALLS  severe osteoporosis    Medications listed in EMR:  has a current medication list which includes the following prescription(s): acetaminophen, amitriptyline, aspirin ec, butalbital-acetaminophen-caffeine, calcium citrate-vitamin d, carvedilol, cyclobenzaprine, cyclosporine, diazepam,  escitalopram, fluticasone, hydrocodone-acetaminophen, latanoprostene bunod, levothyroxine, lidocaine, lisinopril, mesalamine, multivitamin, multiple vitamins-minerals, naloxone, omeprazole, prednisone, sulfasalazine, vitamin d (ergocalciferol), and vitamin e.  Patient/Caregiver does not report changes to medication at this time.    Allergies:  Allergies   Allergen Reactions    Shellfish-Derived Products Angioedema    Diflucan [Fluconazole] Diarrhea    Penicillins Nausea And Vomiting    Tape Other (See Comments)     Causes blisters       EXAMINATION / EVALUATION:    Past Medical History:  Leslie Gross  has a past medical history of Anemia, Arrhythmia (Dg 2008), Blood transfusion without reported diagnosis (2012), Depression, Difficulty in walking(719.7), Fracture (SEPT MID 2019), Fracture (06/2017), GERD (gastroesophageal reflux disease), Glaucoma (DX 2012), Headache(784.0), Hypothyroidism, Mitral valve prolapse, Osteoporosis, Pancreatitis (  2012), Polymyositis, Pulmonary embolism, Shoulder pain (DX JULY 2019), Sleep apnea, and Ulcerative colitis, chronic (DX 1990).    Past Surgical History:  Leslie Gross  has a past surgical history that includes Dilation and curettage of uterus (2011); Cystostomy w/ bladder biopsy (08/2010); Esophagogastroduodenoscopy (2012 MARCH); Lung biopsy (Right, 1998); Ankle Fusion (Left, 1981); Knee surgery (Left, 1981); Cesarean section (1984, 1979); Shoulder muscle transfer (Right, 1972); Tonsillectomy (1956); Gastric bypass (2006); EGD, DILATION (N/A, 12/06/2016); Colonoscopy (N/A, 12/06/2016); Breast surgery (2956-2130); Joint replacement (2012); Fracture surgery; Cholecystectomy (2013); EGD, DILATION (BTN 2011 TRU 2018); D&C DIAGNOSTIC (2011); Cardiac catheterization (2009); Cosmetic surgery (1983); Rotator cuff repair (Right, 2014); and EGD, COLONOSCOPY (N/A, 07/30/2018).        Social History:  Leslie Gross reports good social support system at home..  Leslie Gross reports  adequate housing and no significant barriers to household mobility.  DME Owned:  Single point cane  2 wheel walker  Bed with elevated head, recliner    Patient presents for physical therapy today with spouse.     HISTORY:     Leslie Gross is a 68 y.o. female with chronic cervical pain and L radicular pain with tingling and numbness presents with onset as being for a while, stating I am able to take care of this now that my husband is feeling better. Pt presents with numbness to L 2nd and third fingers, onset: chronic with no specific movements making it better or the same. She tends to keep her LUE over head to help with her radicular pain. She reported that she has not had the numbness for a few days, on a different medication as she was diagnosed with polymyositis Rheumatica disease. The doctor said if PT does not help then she will give me a cortisone injection.    Patient Goal: pain relief    Subjective Report: I have been having this pain for a long time. I get HA's, pain, I can't move very much. I have to move my whole body to look either way. The top of my head feels sore. I am on a new medication which seems like it is helping me with the pain.    Pain:    4/10 achy B cervical region    Level of function:    Prior level of function Level of function at eval     Independent with ADL's Independent with ADL's, lives with husband   Independent with household mobility. Independent with household mobility.    Cervical Pain with sleeping  Cervical am pain, stiffness  HA's   Cervical pain with turning head over shoulders B, difficulty with driving  Cervical pain with reading       REVIEW OF SYSTEMS:  Communication:  Alert and oriented to person, place and time.  Emotional/Behavioral responses appear appropriate at this time.  Demonstrates ability to make needs known.    Cardiopulmonary:  Seated Vital Signs:  Seated LUE 105/53 with 70 bpm   (pt in no distress)    Integumentary:  Unremarkable.        Musculoskeletal System:     INITIAL EVAL   PROM/AROM  INITIAL EVAL   PROM/AROM   Seated AROM cervical   SB  B 25 deg +pain B  Extension 25 deg with pain B  flexion 40 deg with pain B to cervical paraspinals, R upper traps to the base of the skull   Supine PROM flexion with end feel tight  SB limited to full range  Rotation limited with stiffness, feels guarded B   Rot L 18 deg with pain  Rot R 20 deg with pain Upper cervical rotation limited to ~50 % B, +pain   Motion appears to be guarded                     INITIAL EVAL  STRENGTH  INITIAL EVAL    STRENGTH   Grossly BUE WFL  LUE tested in ~90 deg only with flexion and abd 3+/5 +pain with abd RUE flexion, abd 4/5 full range   B elbow flexion, extension  B wrist flexion extension  Fingers abd  Fingers flexion, extension, abd with thumb appears to be functional, and m.strength of  3+/5 Functional ER C7 region B  IR not able secondary to BUE pain, to GT region only       Seated grip strength with elbow flexed  L 20 lbs  R 18 lbs            Circumferential Measurements: RIGHT    LEFT     None today                 SPECIAL TESTS    None today              Palpation:  P-A glide to lower cervical region limited     Joint Mobility:  Joint mobility at C5-C8 region is hypomobile,guarded    Edema:  No swelling / edema noted.    Posture:  Forward head  Rounded shoulders  Thoracic kyphosis    Neuromuscular System:  Sensation:   Light touch intact throughout.(C5, C6, C7 dermatomal region) B    Reflexes:  not assessed today.    Coordination:  Not assessed today.    INITIAL EVAL    Functional Mobility and transfers:    Patient is independent with household and community mobility.   Balance:   Not assessed today.   Gait:  Unremarkable.  no assistive device               Outcomes:   INITIAL EVAL    SPADI: provided  NDI provided                 Patient Education:   Patient was educated on goals and benefits of therapy, as well as postural education, sleeping positions, neutral  posture with nod  Yes, sitting posture as in session was introduced. Pt was educated to see what triggers her radicular pain and what helps it.  Pt. did demonstrate an understanding of this education.  Home Exercise Program was to be mindful of her posture, positions that aggravate her symptoms, and to avoid cervical extension based activities.    Treatment Today:  Evaluation, Patient education., Manual therapy:  Sub occipital release supine for ~ 5 mins with pt education on posture and Therapeutic Exercises: cervical nod yes as tolerated only without any reports of feeling nausea, dizziness.  CUS 1.0w/cm2, 1 mhx x 8 mins to B upper traps, cervical paraspinals region, R > L today seated with arm supported= pt reported feeling good with [place].    EVALUATION AND DIAGNOSIS:   Leslie Gross is a 68 y.o. female presents to physical therapy with hx of cervical pain, B. Pt presents with pain B with AROM, PROM, decrease AROM, PROM, decrease m.strength BUE, LUE with a limiting factor secondary to hx of biceps and RC tear per pt.  Pt presents with radicular symptoms to C6-C7 dermatomal region  on her l hand however unable to provoke peripheral symptoms during eval today. Pt also presents with HA on top of her head, which she describes it as sore, constant. Pt will benefit from skilled PT to improve with cervical pain,centralized, HA and motion with HEP with use of TE, TA, MT, modalities of choice with precautions with severe osteoporosis. Recommend MD consult if no change noted. Pt is aware of the plan and agrees.    Impairments in body function and structure:  Decreased PROM  Decreased AROM  Decreased Strength   Pain  Impaired Proprioception  Postural Dysfunction      Activity Limitations / Restricted Participation:  Cervical Pain with sleeping  Cervical am pain, stiffness  HA's   Cervical pain with turning head over her shoulders, difficulty with driving  Cervical pain with reading                     PROGNOSIS:  Good for  goals.    Without skilled intervention, patient may not be able to:  Perform light household chores safely and independently without risk for fall or injury.    Goals:  Short Term Goals:  To be met by 01/18/19  1.Pt to improve with cervical pain, centralized to 2/10 with able to sleep through the night.  2. Pt to improve with cervical AROM rotation by 10 degrees to A with driving.  3. Pt I with HEP for posture, cervical isometrics, scap strengthening, gentle stretches.    Long Term Goals:  To be met by 02/17/19  1. Pt to improve with cervical pain, centralized to <2/10 with reading, going groceries.  2. Pt to report decrease HA's with intermittent frequency during the day to be able to improve her light duties at home.  3. Pt to improve with NDI by 10 points to show significant improvement with function.    PLAN:  97530 Therapeutic Activities, to include functional mobility training.  [address] Therapeutic Exercises, to include home exercise program.  [phone] Neuromuscular Re-education, to include balance training, propioceptive training.  09811  Aquatic Therapy   97140 Manual Therapy with precautions to osteoporosis  Modalities: 97014 Unattended Estim  97035 Ultrasound    Frequency of treatment:    2 times per week for 8 weeks.    It has been a pleasure to evaluate Sherra A Letitia Libra.  Please contact me with any questions or concerns regarding this patients evaluation or ongoing therapy.     Therapist Signature:    Leslie Gross, MSPT at [facility]  Please feel free to call me at [phone] if have any questions.      12/18/2018      CPT Evaluation Code Justification  CRITERIA JUSTIFICATION DESCRIPTION   Personal factors and/or co-morbidities that impact the plan of care. Factors that affect plan of care include:  Polymyositis, sleep apnea, HA, osteoporosis, fracture L2, mitral valve prolapse, arrhthymias, GERD, ulcerative colitis,  3 (High Complexity)     Body Systems Examined Impairments noted  in:  Musculoskeletal 1-2 elements (Low Complexity)     Clinical Presentation Presentation varies throughout session, days. Evolving (Moderate Complexity)     Clinical Decision Making Standardized Assessment used:  See below for details. Evaluation Code:  Low Complexity

## 2018-12-22 ENCOUNTER — Ambulatory Visit: Payer: Medicare Other

## 2018-12-23 ENCOUNTER — Ambulatory Visit: Payer: Medicare Other

## 2018-12-23 DIAGNOSIS — M4712 Other spondylosis with myelopathy, cervical region: Secondary | ICD-10-CM

## 2018-12-23 DIAGNOSIS — M4802 Spinal stenosis, cervical region: Secondary | ICD-10-CM

## 2018-12-23 DIAGNOSIS — M5412 Radiculopathy, cervical region: Secondary | ICD-10-CM

## 2018-12-23 NOTE — Progress Notes (Signed)
[facility]  [address], [address]  [place], [place]  [address]  Phone:  [phone]  Fax:  [phone]    PHYSICAL THERAPY DAILY TREATMENT NOTE    PATIENT: Leslie Gross DOB: [birth year]   MR #: [id number]  AGE: 68 y.o.    FACILITY PROVIDER #: [id number] PRIMARY MD: Christel Mormon, MD    HICN# Medicare Sub. 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]      Date of Service PT Received On: 12/23/18   Treatment Time Start Time: 0900 to Stop Time: 0950   Time Calculation Time Calculation (min): 50 min   Visit # PT Visit  PT Visit Number: 2/25   Units Billed   Therapeutic Interventions  $ PT Electrical Stimulation-Unattended (32440): 1 Procedure(10 mins)  $ PT Ultrasound (10272): 1 Units(8 mins)  $ PT Therapeutic Exercise (97110): 1 Unit(8 mins)  $ PT Manual Therapy (97140): 2 Unit(24 mins)     [name] referred for physical therapy services by: Shane Crutch, MD    Certification period, precautions, medications and allergies and goals copied from initial evaluation - reviewed and reconciled today.  Louretta Shorten, PT 12/23/2018      Certification Period:  12/18/18 to 02/17/19    Treatment Diagnosis:  Neck Pain [M54.2], Painful Cervical ROM [M54.2] and Impaired Mobility [Z74.09]    Date of onset: chronic  Script dated 12/17/18    Imaging Performed:  Not at this time    Precautions: hx of L2 fracture ~2 years ago  LUE biceps tear, RC tear  FALLS  severe osteoporosis    Medications listed in EMR:  has a current medication list which includes the following prescription(s): acetaminophen, amitriptyline, aspirin ec, butalbital-acetaminophen-caffeine, calcium citrate-vitamin d, carvedilol, cyclobenzaprine, cyclosporine, diazepam, escitalopram,  fluticasone, hydrocodone-acetaminophen, latanoprostene bunod, levothyroxine, lidocaine, lisinopril, mesalamine, multivitamin, multiple vitamins-minerals, naloxone, omeprazole, prednisone, sulfasalazine, vitamin d (ergocalciferol), and vitamin e.  Patient/Caregiver does not report changes to medication at this time.    Allergies:        Allergies   Allergen Reactions    Shellfish-Derived Products Angioedema    Diflucan [Fluconazole] Diarrhea    Penicillins Nausea And Vomiting    Tape Other (See Comments)     Causes blisters            Working Toward the Above Goals: (copied from last visit):  Short Term Goals:  To be met by 01/18/19  1.Pt to improve with cervical pain, centralized to 2/10 with able to sleep through the night.  2. Pt to improve with cervical AROM rotation by 10 degrees to A with driving.  3. Pt I with HEP for posture, cervical isometrics, scap strengthening, gentle stretches.    Long Term Goals:  To be met by 02/17/19  1. Pt to improve with cervical pain, centralized to <2/10 with reading, going groceries.  2. Pt to report decrease HA's with intermittent frequency during the day to be able to improve her light duties at home.  3. Pt to improve with NDI by 10 points to show significant improvement with function.      GOAL# STG Progress Toward Goals   1 Progressing   2 Progressing   3 Progressing                 EXAMINATION:  Subjective Report: I woke up with a HA today.  My eyes feel sore. Sleeping makes my hands numb but I have  Not felt it since our visit last. Hot water feels good on my neck.    Pain:    5/10 HA  3/10 after MT and upon leaving    Objective Findings today: SPADI at 44%  NDI at 36%        Special Tests: none today    INTERVENTION:  Treatment Performed:  MANUAL THERAPY STM to upper traps, levator, cervical paraspinals seated , suboccipital release to upper cervical supine wtih no nausea, dizziness, increase pain= HA decreased (used the occiput pivot wedge for ~3 mins) in order to  facilitate muscle relaxation.  THERAPEUTIC EXERCISES  1:1 per exercise flowsheet (see below) in order to improve flexibility, ROM, strength, stability and balance, promote safe progression of functional activities.  Provided instruction in proper form and position to safely perform exercise, tactile facilitation to correct posture, verbal cues for postural correction, verbal cues to breathe through exercise.  MODALITIES  In order to Decrease pain  Pre-Modulated to B upper traps region supine with MH  for 10 minutes with MHP to same  CUS 1.0w/cm2x8 mins, 3 mhz to B upper traps, cervical paraspinals region seated    Exercise Specifics Date Date Date Date Date Date     12/23/18        isometrics Supine OA x        Sitting posture  x                                                                                                                                                                                  Patient Education:   Patient was educated on modifying activities, posture.  Use of a rolled towel under her occiput when sitting on the recliner to mimic the occiput pivot wedge in session.  Patient did verbalize and demonstrate an understanding of this education.   Home Exercise Program was reviewed today.  With posture.      EVALUATION AND DIAGNOSIS:   Leslie Gross is a 68 y.o. 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 Pt presents with no numbness, tingling B hands, decrease HA at end of MT. Cont as per plan next session.    Impairments in body function and structure:  Decreased PROM  Decreased AROM  Decreased Strength   Pain  Impaired Proprioception  Postural Dysfunction    Activity Limitations / Restricted Participation:    Cervical Pain with  sleeping  Cervical am pain, stiffness  HA's   Cervical pain with turning head over her shoulders, difficulty with driving  Cervical pain with reading               PLAN:   Patient requires continued skilled intervention in order to decrease pain. and meet above mentioned functional goals.  Focus on cervical paraspinals, upper traps, upper cervical, follow up with HEP, last visit next visit.    Therapist Signature:    Louretta Shorten, MSPT at [facility]  Please feel free to call me at [phone] if have any questions.      12/23/2018

## 2018-12-24 ENCOUNTER — Ambulatory Visit: Payer: Medicare Other

## 2018-12-24 NOTE — Discharge Summary (Signed)
[facility]  [address] [place] [address]  Phone: [phone]      Fax: [phone]    PHYSICAL THERAPY DISCHARGE SUMMARY      PATIENT: Leslie Gross DOB: 1951-03-25   MR #: [id number]  AGE: 68 y.o.    FACILITY PROVIDER #: [id number] PRIMARY MD: Christel Mormon, MD    HICN# Medicare Sub. 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  2. Pt to improve with cervical AROM rotation by 10 degrees to A with driving.  3. Pt I with HEP for posture, cervical isometrics, scap strengthening, gentle stretches.    Long Term Goals:  To be met by 02/17/19  1. Pt to improve with cervical pain, centralized to <2/10 with reading, going groceries.  2. Pt to report decrease HA's with intermittent frequency during the day to be able to improve her light duties at home.  3. Pt to improve with NDI by 10 points to show significant improvement with function.     STG's STATUS   1 PROGRESSING: as per last note    2 PROGRESSING: as per last note    3 PROGRESSING: as per last note= use of occiput pivot wedge using a towel  supported without feeling nausea,  dizziness, increase pain, work on her  Sitting posture =pt is aware   ASSESSMENT: (as per last treatment note)  Leslie Gross is a 68 y.o. 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 Pt presents with no numbness, tingling B hands, decrease HA at end of MT. Cont as per plan next session.      PLAN:  Pt self DISCHARGED FROM THERAPY AT THIS TIME SECONDARY TO coronavirus scare.    It has been a pleasure to work with [name].  Please contact me with any questions or concerns regarding your patients care.  Thank you,     Louretta Shorten, MSPT at [facility]  Please feel free to call me at [phone] if have any questions.      12/24/2018

## 2018-12-25 ENCOUNTER — Ambulatory Visit: Payer: Medicare Other

## 2018-12-29 ENCOUNTER — Ambulatory Visit: Payer: Medicare Other

## 2018-12-29 LAB — VAHRT HISTORIC LVEF: Ejection Fraction: 65 %

## 2018-12-30 ENCOUNTER — Ambulatory Visit: Payer: Medicare Other

## 2018-12-30 ENCOUNTER — Ambulatory Visit (INDEPENDENT_AMBULATORY_CARE_PROVIDER_SITE_OTHER): Payer: Self-pay | Admitting: Cardiology

## 2018-12-31 ENCOUNTER — Ambulatory Visit: Payer: Medicare Other

## 2019-01-01 ENCOUNTER — Ambulatory Visit: Payer: Medicare Other

## 2019-01-05 ENCOUNTER — Ambulatory Visit: Payer: Medicare Other

## 2019-01-06 ENCOUNTER — Ambulatory Visit: Payer: Medicare Other

## 2019-01-07 ENCOUNTER — Ambulatory Visit: Payer: Medicare Other

## 2019-01-08 ENCOUNTER — Ambulatory Visit: Payer: Medicare Other

## 2019-01-12 ENCOUNTER — Ambulatory Visit: Payer: Medicare Other

## 2019-01-13 ENCOUNTER — Ambulatory Visit: Payer: Medicare Other

## 2019-01-14 ENCOUNTER — Ambulatory Visit: Payer: Medicare Other

## 2019-01-15 ENCOUNTER — Ambulatory Visit: Payer: Medicare Other

## 2019-01-19 ENCOUNTER — Ambulatory Visit: Payer: Medicare Other

## 2019-01-20 ENCOUNTER — Ambulatory Visit: Payer: Medicare Other

## 2019-01-21 ENCOUNTER — Ambulatory Visit: Payer: Medicare Other

## 2019-01-26 ENCOUNTER — Ambulatory Visit: Payer: Medicare Other

## 2019-01-27 ENCOUNTER — Ambulatory Visit: Payer: Medicare Other

## 2019-01-29 ENCOUNTER — Ambulatory Visit: Payer: Medicare Other

## 2019-02-03 ENCOUNTER — Ambulatory Visit: Payer: Medicare Other

## 2019-02-05 ENCOUNTER — Ambulatory Visit: Payer: Medicare Other

## 2019-02-10 ENCOUNTER — Ambulatory Visit: Payer: Medicare Other

## 2019-02-12 ENCOUNTER — Ambulatory Visit: Payer: Medicare Other

## 2019-02-17 ENCOUNTER — Ambulatory Visit: Payer: Medicare Other

## 2019-02-19 ENCOUNTER — Ambulatory Visit: Payer: Medicare Other

## 2019-02-24 ENCOUNTER — Ambulatory Visit: Payer: Medicare Other

## 2019-02-26 ENCOUNTER — Ambulatory Visit: Payer: Medicare Other

## 2019-03-03 ENCOUNTER — Ambulatory Visit: Payer: Medicare Other

## 2019-03-05 ENCOUNTER — Ambulatory Visit: Payer: Medicare Other

## 2019-03-10 ENCOUNTER — Ambulatory Visit: Payer: Medicare Other

## 2019-03-12 ENCOUNTER — Ambulatory Visit: Payer: Medicare Other

## 2019-04-29 ENCOUNTER — Ambulatory Visit: Payer: Medicare Other

## 2019-05-03 ENCOUNTER — Ambulatory Visit: Payer: Medicare Other

## 2019-05-05 ENCOUNTER — Ambulatory Visit: Payer: Medicare Other

## 2019-05-06 ENCOUNTER — Ambulatory Visit: Payer: Medicare Other

## 2019-05-10 ENCOUNTER — Ambulatory Visit: Payer: Medicare Other

## 2019-05-11 ENCOUNTER — Ambulatory Visit: Payer: Medicare Other

## 2019-05-13 ENCOUNTER — Ambulatory Visit: Payer: Medicare Other

## 2019-05-18 ENCOUNTER — Ambulatory Visit: Payer: Medicare Other

## 2019-05-20 ENCOUNTER — Ambulatory Visit: Payer: Medicare Other

## 2019-05-25 ENCOUNTER — Ambulatory Visit: Payer: Medicare Other

## 2019-05-27 ENCOUNTER — Ambulatory Visit: Payer: Medicare Other

## 2019-05-28 ENCOUNTER — Ambulatory Visit: Payer: Medicare Other

## 2019-06-01 ENCOUNTER — Ambulatory Visit: Payer: Medicare Other

## 2019-06-03 ENCOUNTER — Ambulatory Visit: Payer: Medicare Other

## 2019-06-08 ENCOUNTER — Ambulatory Visit: Payer: Medicare Other

## 2019-06-10 ENCOUNTER — Ambulatory Visit: Payer: Medicare Other

## 2019-06-15 ENCOUNTER — Ambulatory Visit: Payer: Medicare Other

## 2019-06-16 ENCOUNTER — Ambulatory Visit: Payer: Medicare Other | Attending: Orthopaedic Surgery

## 2019-06-16 DIAGNOSIS — R2689 Other abnormalities of gait and mobility: Secondary | ICD-10-CM | POA: Insufficient documentation

## 2019-06-16 DIAGNOSIS — M4726 Other spondylosis with radiculopathy, lumbar region: Secondary | ICD-10-CM | POA: Insufficient documentation

## 2019-06-16 DIAGNOSIS — G4486 Cervicogenic headache: Secondary | ICD-10-CM

## 2019-06-16 DIAGNOSIS — M4316 Spondylolisthesis, lumbar region: Secondary | ICD-10-CM | POA: Insufficient documentation

## 2019-06-16 DIAGNOSIS — R292 Abnormal reflex: Secondary | ICD-10-CM | POA: Insufficient documentation

## 2019-06-16 DIAGNOSIS — G8929 Other chronic pain: Secondary | ICD-10-CM

## 2019-06-16 DIAGNOSIS — M545 Low back pain: Secondary | ICD-10-CM | POA: Insufficient documentation

## 2019-06-16 DIAGNOSIS — M8580 Other specified disorders of bone density and structure, unspecified site: Secondary | ICD-10-CM | POA: Insufficient documentation

## 2019-06-16 DIAGNOSIS — R51 Headache: Secondary | ICD-10-CM | POA: Insufficient documentation

## 2019-06-16 DIAGNOSIS — M542 Cervicalgia: Secondary | ICD-10-CM

## 2019-06-16 DIAGNOSIS — M6281 Muscle weakness (generalized): Secondary | ICD-10-CM

## 2019-06-16 NOTE — PT Eval Note (Signed)
[facility]  [address], [address]  [place], [place]  [address]  Phone:  [phone]  Fax:  [phone]    PHYSICAL THERAPY EVALUATION AND PLAN OF CARE           Referred By: Shane Crutch, MD    *I agree to the plan of care stated below*    ______________________________________________________________________________________  Physician Signature      Date      PATIENT: [name] DOB: September 09, 1951   MR #: [id number]  AGE: 68 y.o.    FACILITY PROVIDER #: [id number] PRIMARY MD: Christel Mormon, MD    HICN# Medicare Sub. 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];Abnormal reflex [R29.2]        Date of Service PT Received On: 06/16/19   Treatment Time Start Time: 1002 to Stop Time: 1105   Time Calculation Time Calculation (min): 63 min   Visit # PT Visit  PT Visit Number: 1   Units Billed   Therapeutic Interventions  $ PT Therapeutic Activity (97530): 4 units(60 min)     Certification Period:  06/16/19 to 09/10/19    Treatment Diagnosis: Neck Pain [M54.2], Painful Cervical ROM [M54.2], Chronic Lower Back Pain without Radiculopathy  [M54.5, G89.29, Generalized Muscle Weakness [M62.81] and cervicogenic headache R51    Date of onset: 2018    Imaging Performed:  No recent images of neck in Epic.  Possible X-ray result at spine specialist.    Precautions: Fall risk, h/o Osteoporosis,     Medications listed in EMR:  has a current medication list which includes the following prescription(s): acetaminophen, amitriptyline, calcium citrate-vitamin d, carvedilol,  cyclosporine, diazepam, escitalopram, fluticasone, hydrocodone-acetaminophen, latanoprostene bunod, levothyroxine, lidocaine, lisinopril, mesalamine,  multivitamin, multiple vitamins-minerals, naloxone, omeprazole, sulfasalazine, vitamin d (ergocalciferol), and vitamin e.  Patient/Caregiver does report changes to medication at this time.  Updated above.    Allergies:  Allergies   Allergen Reactions    Shellfish-Derived Products Angioedema    Diflucan [Fluconazole] Diarrhea    Penicillins Nausea And Vomiting    Tape Other (See Comments)     Causes blisters       EXAMINATION / EVALUATION:    Past Medical History:  AALINA BREGE  has a past medical history of Anemia, Arrhythmia (Dg 2008), Blood transfusion without reported diagnosis (2012), Depression, Difficulty in walking(719.7), Fracture (SEPT MID 2019), Fracture (06/2017), GERD (gastroesophageal reflux disease), Glaucoma (DX 2012), Headache(784.0), Hypothyroidism, Mitral valve prolapse, Osteoporosis, Pancreatitis (2012), Polymyositis, Pulmonary embolism, Shoulder pain (DX JULY 2019), Sleep apnea, and Ulcerative colitis, chronic (DX 1990).    Past Surgical History:  SABRIAH HOBBINS  has a past surgical history that includes Dilation and curettage of uterus (2011); Cystostomy w/ bladder biopsy (08/2010); Esophagogastroduodenoscopy (2012 MARCH); Lung biopsy (Right, 1998); Ankle Fusion (Left, 1981); Knee surgery (Left, 1981); Cesarean section (1984, 1979); Shoulder muscle transfer (Right, 1972); Tonsillectomy (1956); Gastric bypass (2006); EGD, DILATION (N/A, 12/06/2016); Colonoscopy (N/A, 12/06/2016); Breast surgery (2440-1027); Joint replacement (2012) Lt knee; Fracture surgery; Cholecystectomy (2013); EGD, DILATION (BTN 2011 TRU 2018); D&C DIAGNOSTIC (2011); Cardiac catheterization (2009); Cosmetic surgery (1983); Rotator cuff repair (Right, 2014); and EGD, COLONOSCOPY (N/A, 07/30/2018).  Rt shoulder replacement (02/12/16), Lumbar decompression (3 level) 07/2018      Social History:  TAMANTHA SALINE is married, lives in town house.  Jessi  reports adequate housing and no significant barriers to household  mobility.  DME Owned:  Shower Chair, Single point cane and 2-Wheel Walker    Patient presents for physical therapy today alone.     HISTORY:     ESTY AHUJA is a 68 y.o. female w/ long h/o neck pain which got worse in the last year.  Pt had PT for neck pain in 2018.  NOW HER NECK STIFFNESS AND PAIN IS GETTING WORSE.  Pt reports that she doesn't have much space between her neck or back joints (per x-ray report) -- and has been very stiff for a long time.  She has had several injection on the sides of her neck and one shot on the Lt shoulder--didn't help w/ the pain.  Back pain has gotten better since her decompression surgery last year in [place].  Pt was living at her mother's house at the time.  This year she has had a hard time taking care of husband who her neck and back have progressively gotten more tight and painful.    Patient Goal: to be able to move better w/ less pain, improve flexibility.    Subjective Report: Pt reports that since she started on the new meds her neck is feeling a bit better.  Lt 5th and 4th digit get numb and lock up at night (the locking is much better since pt started on her new meds).    Pain:  6-7/10 avg pain for neck (w/ movement), no pain at rest at neck;  3/10 low back pain at worst; 5-6/10 w/ standing activities.    Level of function:    Prior level of function  Level of function at eval     No headaches. Headaches at least once/week when the neck gets really tight and sore.   No issues w/ driving Difficulty w/ turning head to drive due to neck pain and stiffness--has to turn her body to drive safely.   Could stand as long as needed. Can't stand 15-20 min on a good day in the kitchen due to back pain and weakness.   No back pain w/ doing household chores. Back pain w/ unloading/loading dishwasher, folding laundry, making bed etc.   Able to sleep w/o much issues. Not able to get a good night's rest due to neck pain.   Able to ADLs w/o pain and faster. Pain w/ ADLs and does  activities slowly due to neck and back pain.     REVIEW OF SYSTEMS:  Communication:  Alert and oriented to person, place and time.  Emotional/Behavioral responses appear appropriate at this time.  Demonstrates ability to make needs known.    Cardiopulmonary:  Seated Vital Signs:  right upper extremity:  Blood Pressure: 104/61  Pulse: 69    Integumentary: Unremarkable.     Musculoskeletal System:     INITIAL EVAL   AROM  INITIAL EVAL   PROM INITIAL EVALUATION STRENGTH   Cervical rot'n: Lt 14 deg, Rt 18 deg ( w/ pain at end range 5/10 on both sides) Cervical Rot'n: Lt 20 deg and Rt 25 deg w/ pain at end range. Isometric rot'n: poor w/ pain   Cervical SB: Lt 14 deg, Rt 15 deg w/ 5/10 pain at end range. Moderate restriction in SB. Isometric SB fair bil   Cervical flxn: 24 deg w/ 5/10 pain at end range. Moderate restriction in flxn w/ pain at end range.. Isometric flxn: poor   Cervical ext: 25 deg 4/10 pain at  end range, shaky and jerky movements Mod restriction in ext w/ pain. Isometric ext: good   Lumbar flxn: mid shin w/ pain upper thoracic area 3/10 NT Hip flxn: 3/5 bil   Lumbar SB: Lt 2" above knee joint line, Rt 3" above knee joint line. NT Hip abd: 2/5 bil   Lumbar ext: no extension movement. NT Hip ext: not able to lift hips off mat w/ bridging (unable to assume prone pos'n)   Lumbar rot'n: 25% decreased w/ back pain 4/10 NT Bil Quads: 4-/5       Palpation: +TTP bil occiput, bil UTs; tightness in bil cervical erector spinae, bil UTs.    Joint Mobility: severe restriction cervical p/a mobility in supine.    Edema: No swelling / edema noted.    Posture: forward head, rounded shoulders, wide BOS.    Neuromuscular System:  Sensation: Light touch intact throughout.  Reflexes: not assessed today.  Coordination: Not assessed today.    INITIAL EVAL    Functional Mobility and transfers:    Patient is independent with household and community mobility.   Balance:   SLS R LE 2 sec  SLS L LE 1 sec   Gait: Compensated  Trendelenberg gait w/ increased lateral trunk sway; Decreased toe clearance bilaterally; No terminal knee extension noted bil; No pelvic rotation noted; w/o assistive device.     Outcomes:   INITIAL EVAL    NDI:60%      Oswestry: Oswestry 52%   ABC: 67.5%     Patient Education:   Patient was educated on goals and benefits of therapy, as well as  Importance of being compliant w/ HEP routine.    Pt. did verbalized an understanding of this education.  Home Exercise Program was reviewed today: pt to start the stretching exercises for neck from previous PT session.    Treatment Today:  Evaluation and Patient education.    EVALUATION AND DIAGNOSIS:   CAMRYNN MCCLINTIC is a 68 y.o. 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  Pt presents w/ the below listed physical and functional impairments.  She will benefit from skilled PT intervention to improve ROM of neck and back, improve strength, stability and balance to allow pt to return to her PLOF.      Impairments in body function and structure:  Decreased PROM  Decreased AROM  Decreased Strength   Pain  Impaired Balance  Impaired Gait  Postural Dysfunction  See above review of systems for details.    Activity Limitations / Restricted Participation:  Headaches at least once/week when the neck gets really tight and sore.   Difficulty w/ turning head to drive due to neck pain and stiffness--has to turn her body to drive safely.   Can't stand 15-20 min on a good day in the kitchen due to back pain and weakness.   Back pain w/ unloading/loading dishwasher, folding laundry, making bed etc.   Not able to get a good night's rest due to neck pain.   Pain w/ ADLs and does activities  slowly due to neck and back pain.     See above review of systems for details.    PROGNOSIS:  Co-morbidities may affect progress:  Long h/o neck and back pain,  The above mentioned barriers may be a factor influencing rehabilitation potential.  As pt's neck, back and mobility/balance will be addressed every tx session the progress will be slow.    Without skilled intervention, patient may not be able to:  Perform light household chores safely and independently without risk for fall or injury.  Participate in safe ADL's such as bathing, dressing, grooming.  Participate in safe and independent community ambulation for IADL's such as grocery shopping and preparing meals.  Be able to assist spouse w/o hurting her back and/or neck.    Goals:  Short Term Goals:  To be met by 8 visits  1.  Pt will be 100% compliant w/ HEP routine to progress w/ ROM, strength and stability.  2.  Pt will have at least 5 deg improvement in cervical ROM to allow pt to progress towards turning head safely while driving.  3.  NDI and Oswestry score will have >5 point improvement indicating improved function and decreased pain.    Long Term Goals:  To be met by 24 visitis  4.  Improve cervical sidebend to at least 5 degrees to allow pt to have no > 1-2 hr sleep loss at night due to neck pain.  5.  Pt will have improved hip strength by 1 grade to allow pt to do household chores w/ less back pain.  6.  Improved pt's current functional status by at least 75% .  7.  ABC score will be >80% indicating improved mobility and balance.  8.  NDI and Oswestry will be < 40 % indicating improved function and decreased pain.    PLAN:  97530 Therapeutic Activities, to include functional mobility training.  [address] Therapeutic Exercises, to include home exercise program.  [id number] Neuromuscular Re-education, to include balance training.  098119 Gait Training  14782 Manual Therapy  Modalities: 97014 Unattended Estim  97035 Ultrasound  Patient/Caregiver Education and  Training    Frequency of treatment:  total of 24 visits    It has been a pleasure to evaluate Darrelyn A Letitia Libra.  Please contact me with any questions or concerns regarding this patients evaluation or ongoing therapy.     Therapist Signature:    [name], PT, MPT [phone]  Outpatient Speciality Rehab  Physical Medicine and Rehabilitation  [facility]  P: [phone]     06/17/2019      CPT Evaluation Code Justification  CRITERIA JUSTIFICATION DESCRIPTION   Personal factors and/or co-morbidities that impact the plan of care. Factors that affect plan of care include:  Osteoarthritis Chronic pain Chronic injury Generalized weakness Limited endurance for participation in independent functional activities 3 (High Complexity)     Body Systems Examined Impairments noted in:  Musculoskeletal  Neuromuscular  Cardiovascular  Body structures/regions 3 or more elements (Moderate Complexity)     Clinical Presentation Presentation varies throughout session, days. Evolving (Moderate Complexity)     Clinical Decision Making Standardized Assessment used:  See below for details. Evaluation Code:  Moderate Complexity

## 2019-06-17 ENCOUNTER — Ambulatory Visit: Payer: Medicare Other

## 2019-06-22 ENCOUNTER — Ambulatory Visit
Admission: RE | Admit: 2019-06-22 | Discharge: 2019-06-22 | Disposition: A | Discharge status: Home / Self Care | Payer: Medicare Other | Source: Ambulatory Visit | Attending: Rheumatology | Admitting: Rheumatology

## 2019-06-22 ENCOUNTER — Ambulatory Visit: Payer: Medicare Other

## 2019-06-22 DIAGNOSIS — E559 Vitamin D deficiency, unspecified: Secondary | ICD-10-CM | POA: Insufficient documentation

## 2019-06-22 DIAGNOSIS — R809 Proteinuria, unspecified: Secondary | ICD-10-CM | POA: Insufficient documentation

## 2019-06-22 DIAGNOSIS — R888 Abnormal findings in other body fluids and substances: Secondary | ICD-10-CM

## 2019-06-22 DIAGNOSIS — R7309 Other abnormal glucose: Secondary | ICD-10-CM | POA: Insufficient documentation

## 2019-06-22 DIAGNOSIS — M6281 Muscle weakness (generalized): Secondary | ICD-10-CM | POA: Insufficient documentation

## 2019-06-22 DIAGNOSIS — Z79899 Other long term (current) drug therapy: Secondary | ICD-10-CM | POA: Insufficient documentation

## 2019-06-22 LAB — COMPREHENSIVE METABOLIC PANEL
ALT: 37 U/L (ref 0–55)
AST (SGOT): 34 U/L (ref 5–34)
Albumin/Globulin Ratio: 1.7 (ref 0.9–2.2)
Albumin: 3.7 g/dL (ref 3.5–5.0)
Alkaline Phosphatase: 115 U/L — ABNORMAL HIGH (ref 37–106)
Anion Gap: 8 (ref 5.0–15.0)
BUN: 11.5 mg/dL (ref 7.0–19.0)
Bilirubin, Total: 0.4 mg/dL (ref 0.2–1.2)
CO2: 26 mEq/L (ref 22–29)
Calcium: 8.3 mg/dL (ref 7.9–10.2)
Chloride: 106 mEq/L (ref 100–111)
Creatinine: 0.7 mg/dL (ref 0.6–1.0)
Globulin: 2.2 g/dL (ref 2.0–3.6)
Glucose: 113 mg/dL — ABNORMAL HIGH (ref 70–100)
Potassium: 4.9 mEq/L (ref 3.5–5.1)
Protein, Total: 5.9 g/dL — ABNORMAL LOW (ref 6.0–8.3)
Sodium: 140 mEq/L (ref 136–145)

## 2019-06-22 LAB — CBC AND DIFFERENTIAL
Absolute NRBC: 0 10*3/uL (ref 0.00–0.00)
Basophils Absolute Automated: 0.06 10*3/uL (ref 0.00–0.08)
Basophils Automated: 1 %
Eosinophils Absolute Automated: 0.15 10*3/uL (ref 0.00–0.44)
Eosinophils Automated: 2.4 %
Hematocrit: 39.3 % (ref 34.7–43.7)
Hgb: 12.2 g/dL (ref 11.4–14.8)
Immature Granulocytes Absolute: 0.02 10*3/uL (ref 0.00–0.07)
Immature Granulocytes: 0.3 %
Lymphocytes Absolute Automated: 1.61 10*3/uL (ref 0.42–3.22)
Lymphocytes Automated: 26.1 %
MCH: 31.7 pg (ref 25.1–33.5)
MCHC: 31 g/dL — ABNORMAL LOW (ref 31.5–35.8)
MCV: 102.1 fL — ABNORMAL HIGH (ref 78.0–96.0)
MPV: 11.1 fL (ref 8.9–12.5)
Monocytes Absolute Automated: 0.43 10*3/uL (ref 0.21–0.85)
Monocytes: 7 %
Neutrophils Absolute: 3.89 10*3/uL (ref 1.10–6.33)
Neutrophils: 63.2 %
Nucleated RBC: 0 /100 WBC (ref 0.0–0.0)
Platelets: 219 10*3/uL (ref 142–346)
RBC: 3.85 10*6/uL — ABNORMAL LOW (ref 3.90–5.10)
RDW: 13 % (ref 11–15)
WBC: 6.16 10*3/uL (ref 3.10–9.50)

## 2019-06-22 LAB — URINALYSIS, REFLEX TO MICROSCOPIC EXAM IF INDICATED
Blood, UA: NEGATIVE
Glucose, UA: NEGATIVE
Ketones UA: NEGATIVE
Leukocyte Esterase, UA: NEGATIVE
Nitrite, UA: NEGATIVE
Protein, UR: 30 — AB
Specific Gravity UA: 1.034 (ref 1.001–1.035)
Urine pH: 5 (ref 5.0–8.0)
Urobilinogen, UA: NORMAL mg/dL (ref 0.2–2.0)

## 2019-06-22 LAB — HEMOLYSIS INDEX: Hemolysis Index: 19 — ABNORMAL HIGH (ref 0–18)

## 2019-06-22 LAB — GFR: EGFR: >60

## 2019-06-22 LAB — C-REACTIVE PROTEIN: C-Reactive Protein: 0.1 mg/dL (ref 0.0–0.8)

## 2019-06-22 LAB — HEMOGLOBIN A1C
Average Estimated Glucose: 96.8 mg/dL
Hemoglobin A1C: 5 % (ref 4.6–5.9)

## 2019-06-22 LAB — PROTEIN / CREATININE RATIO, URINE
Urine Creatinine, Random: 184.1 mg/dL
Urine Protein Random: 46.6 mg/dL — ABNORMAL HIGH (ref 1.0–14.0)
Urine Protein/Creatinine Ratio: 0.3

## 2019-06-22 LAB — HBA1C PATH REVIEW

## 2019-06-22 LAB — VITAMIN D,25 OH,TOTAL: Vitamin D, 25 OH, Total: 22 ng/mL — ABNORMAL LOW (ref 30–100)

## 2019-06-22 LAB — SEDIMENTATION RATE: Sed Rate: 15 mm/Hr (ref 0–20)

## 2019-06-23 ENCOUNTER — Ambulatory Visit: Payer: Medicare Other

## 2019-06-23 DIAGNOSIS — M4802 Spinal stenosis, cervical region: Secondary | ICD-10-CM

## 2019-06-23 DIAGNOSIS — M542 Cervicalgia: Secondary | ICD-10-CM

## 2019-06-23 DIAGNOSIS — G4486 Cervicogenic headache: Secondary | ICD-10-CM

## 2019-06-23 DIAGNOSIS — G8929 Other chronic pain: Secondary | ICD-10-CM

## 2019-06-23 DIAGNOSIS — M6281 Muscle weakness (generalized): Secondary | ICD-10-CM

## 2019-06-23 NOTE — Progress Notes (Signed)
[facility]  [address]  [place], [place]  [address]  Phone:  [phone]  Fax:  [phone]    PHYSICAL THERAPY DAILY TREATMENT NOTE    PATIENT: Leslie Gross DOB: Feb 27, 1951   MR #: [id number]  AGE: 68 y.o.    FACILITY PROVIDER #: [id number] PRIMARY MD: Christel Mormon, MD    HICN# Medicare Sub. 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];Abnormal reflex [R29.2]      Date of Service PT Received On: 06/23/19   Treatment Time Start Time: 0905 to Stop Time: 1005   Time Calculation Time Calculation (min): 60 min   Visit # PT Visit  PT Visit Number: 2   Units Billed   Therapeutic Interventions  $ PT Electrical Stimulation-Unattended (46962): 1 Procedure  $ PT Therapeutic Exercise (97110): 1 Unit(12 min)  $ PT Manual Therapy (97140): 2 Unit(30 min)     [name] referred for physical therapy services by: Shane Crutch, MD    Certification period, precautions, medications and allergies and goals copied from initial evaluation - reviewed and reconciled today.  [name], PT 06/23/2019     Certification Period:  06/16/19 to 09/10/19    Treatment Diagnosis: Neck Pain [M54.2], Painful Cervical ROM [M54.2],Chronic Lower Back Pain without Radiculopathy [M54.5, G89.29, Generalized Muscle Weakness [M62.81] andcervicogenic headache R51    Date of onset: 2018    Imaging Performed:  No recent images of neck in Epic.  Possible X-ray result at spine specialist.    Goals:  Short Term Goals:  To be met by 8 visits  1.  Pt will be 100% compliant w/ HEP routine to progress w/ ROM, strength and stability.  2.  Pt will have at least 5 deg improvement in cervical ROM to allow pt to progress towards  turning head safely while driving.  3.  NDI and Oswestry score will have >5 point improvement indicating improved function and decreased pain.    Long Term Goals:  To be met by 24 visitis  4.  Improve cervical sidebend to at least 5 degrees to allow pt to have no > 1-2 hr sleep loss at night due to neck pain.  5.  Pt will have improved hip strength by 1 grade to allow pt to do household chores w/ less back pain.  6.  Improved pt's current functional status by at least 75% .  7.  ABC score will be >80% indicating improved mobility and balance.  8.  NDI and Oswestry will be < 40 % indicating improved function and decreased pain.   Working Toward the Above Goals: (copied from last visit):    Education and Infection Prevention Regarding Covid-19 Pandemic:  Patient was educated on [employer] and Procedure regarding COVID-19 care and safety precautions. Discussed and reviewed patient's medical history and personal risk factors and patient acknowledges exposure risk and wishes to proceed with care.  [id number] Screen: Patient does not present with a temperature >/= 100.0, no new cough, no new throat pain, and no new shortness of breath.   Personal Protective Equipment Utilized:  Gloves, Goggles and Procedure Mask      EXAMINATION:  Subjective Report: Pt took two extra strength tylenol today around 8:30.  Lt shoulder is much better since her shot but there is a twinge because she  lifted grocery bags onto kitchen counter.    Pain:  4-5/10 w/ neck movement; at rest 0/10    Objective Findings today: Palpation: +TTP bil cervical erector spinae, bil occiput, mod tight bil upper cervical erector spinae, bil UTs.    INTERVENTION:  Treatment Performed:  MANUAL THERAPY : STM and MFR to neck, SCM, and bil UTs; occipital release, PROM into flxn, rot'n and SB, upper cervical rot'n, and grade I p/a glides to cervical spine-- all to increase ROM, decrease pain.  THERAPEUTIC ACTIVITIES (5 min) no charge: discussed posture correction,  keeping bil shoulders relaxed vs shrugging.  THERAPEUTIC EXERCISES : 1:1 per exercise flow sheet to improve flexibility, ROM, strength, stability, balance, functional mobility.  VCs and TCs provided to perform exercise correctly.   MODALITIES  In order to Decrease pain, Decrease spasm and Increase Extensiblility  Pre-Modulated to bil cervical erector spinae for 13 minutes with MHP to neck.        Exercise Specifics Date   06/23/19 Date Date Date Date Date   Cervical retraction supine 5 sec x10-2        LTR  x10 -2        pelvic tilt Supine h/l x10-2        SKC Supine h/l Passive  10" x3        HS stretch Supine h/l passive 20" x2        Cervical Arc seated(no ext) x5-2        upper cervical flxn stretch supine 10" x4                                                    Patient Education:   Patient was educated on HEP routine.    Patient did verbalize and demonstrate an understanding of this education.   Home Exercise Program was issued today.[name]    EVALUATION AND DIAGNOSIS:   Leslie Gross is a 68 y.o. 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   Pt w/ min increase in PROM of neck post manual therapy.  Will continue to benefit from skilled PT intervention to improve ROM, flexibility and stability.  Impairments in body function and structure:  Decreased PROM  Decreased AROM  Decreased Strength   Pain  Impaired Balance  Impaired Gait  Postural Dysfunction  See above review of systems for details.    Activity Limitations / Restricted Participation:  Headaches at least once/week when the neck gets really tight and sore.   Difficulty w/  turning head to drive due to neck pain and stiffness--has to turn her body to drive safely.   Can't stand 15-20 min on a good day in the kitchen due to back pain and weakness.   Back pain w/ unloading/loading dishwasher, folding laundry, making bed etc.   Not able to get a good night's rest due to neck pain.   Pain w/ ADLs and does activities slowly due to neck and back pain.     PLAN:   Patient requires continued skilled intervention in order to decrease pain. and meet above mentioned functional goals.  Focus on manual therapy, therex, more focus on lumbar dx next  visit.    Therapist Signature:    [name], PT, MPT [phone]  Outpatient Speciality Rehab  Physical Medicine and Rehabilitation  [facility]  P: [phone]     06/23/2019

## 2019-06-24 ENCOUNTER — Ambulatory Visit: Payer: Medicare Other

## 2019-06-28 ENCOUNTER — Ambulatory Visit: Payer: Medicare Other

## 2019-06-28 DIAGNOSIS — G8929 Other chronic pain: Secondary | ICD-10-CM

## 2019-06-28 DIAGNOSIS — M6281 Muscle weakness (generalized): Secondary | ICD-10-CM

## 2019-06-28 DIAGNOSIS — M545 Low back pain, unspecified: Secondary | ICD-10-CM

## 2019-06-28 DIAGNOSIS — M542 Cervicalgia: Secondary | ICD-10-CM

## 2019-06-28 DIAGNOSIS — G4486 Cervicogenic headache: Secondary | ICD-10-CM

## 2019-06-28 DIAGNOSIS — M4802 Spinal stenosis, cervical region: Secondary | ICD-10-CM

## 2019-06-28 NOTE — Progress Notes (Signed)
[facility]  [address], [address]  [place], [place]  [address]  Phone:  [phone]  Fax:  [phone]    PHYSICAL THERAPY DAILY TREATMENT NOTE    PATIENT: Leslie Gross DOB: 1951-04-16   MR #: [id number]  AGE: 68 y.o.    FACILITY PROVIDER #: [id number] PRIMARY MD: Christel Mormon, MD    HICN# Medicare Sub. 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];Abnormal reflex [R29.2]      Date of Service PT Received On: 06/28/19   Treatment Time Start Time: 1102 to Stop Time: 1200   Time Calculation Time Calculation (min): 58 min   Visit # PT Visit  PT Visit Number: 3   Units Billed   Therapeutic Interventions  $ PT Therapeutic Exercise (97110): 2 Units(28 min)  $ PT Manual Therapy (97140): 2 Unit(28 min)     Neshia Fonnie Mu referred for physical therapy services by: Shane Crutch, MD    Certification period, precautions, medications and allergies and goals copied from initial evaluation - reviewed and reconciled today.  [name], PT 06/28/2019     Certification Period:  06/16/19 to 09/10/19    Treatment Diagnosis: Neck Pain [M54.2], Painful Cervical ROM [M54.2],Chronic Lower Back Pain without Radiculopathy [M54.5, G89.29, Generalized Muscle Weakness [M62.81] andcervicogenic headache R51    Date of onset: 2018  Precautions: Fall risk, h/o Osteoporosis, polymyositis  Imaging Performed:  No recent images of neck in Epic.  Possible X-ray result at spine specialist.    Goals:  Short Term Goals:  To be met by 8 visits  1.  Pt will be 100% compliant w/ HEP routine to progress w/ ROM, strength and stability.  2.  Pt will have at least 5 deg improvement in cervical ROM to allow pt to progress towards turning  head safely while driving.  3.  NDI and Oswestry score will have >5 point improvement indicating improved function and decreased pain.    Long Term Goals:  To be met by 24 visitis  4.  Improve cervical sidebend to at least 5 degrees to allow pt to have no > 1-2 hr sleep loss at night due to neck pain.  5.  Pt will have improved hip strength by 1 grade to allow pt to do household chores w/ less back pain.  6.  Improved pt's current functional status by at least 75% .  7.  ABC score will be >80% indicating improved mobility and balance.  8.  NDI and Oswestry will be < 40 % indicating improved function and decreased pain.   Working Toward the Above Goals: (copied from last visit):    Education and Infection Prevention Regarding Covid-19 Pandemic:  Patient was educated on [employer] and Procedure regarding COVID-19 care and safety precautions. Discussed and reviewed patient's medical history and personal risk factors and patient acknowledges exposure risk and wishes to proceed with care.  IONGE95 Screen: Patient does not present with a temperature >/= 100.0, no new cough, no new throat pain, and no new shortness of breath.   Personal Protective Equipment Utilized:  Gloves, Goggles and Procedure Mask    EXAMINATION:  Subjective Report: Pt woke up w/ 5/10 pain and it decreased a bit after a hot bath.  This weekend the back pain was 7-8/10 and had to take pain meds.  Pain:  Neck pain 5/10 at rest this morning; at rest 0/10  Low back pain 3/10 today.    Objective Findings today: Palpation: +TTP bil cervical erector spinae, bil occiput, mod tight bil upper cervical erector spinae, bil UTs.    INTERVENTION:  Treatment Performed:  MANUAL THERAPY : STM and MFR to neck, SCM, and bil UTs; occipital release, PROM into flxn, rot'n and SB, upper cervical rot'n, and grade I p/a glides to cervical spine-- all to increase ROM, decrease pain.  THERAPEUTIC ACTIVITIES (5 min) no charge: discussed posture correction, keeping bil  shoulders relaxed vs shrugging.  THERAPEUTIC EXERCISES : 1:1 per exercise flow sheet to improve flexibility, ROM, strength, stability, balance, functional mobility.  VCs and TCs provided to perform exercise correctly.         Exercise Specifics Date   06/23/19 Date 06/28/19 Date Date Date Date   UBE Fwd/retro - Retro 3'       Rows stnding - yel TB x10-2       shldr ext " - Yel TB x10-2       Cervical retraction supine 5 sec x10-2 5 sec x10       LTR  x10 -2 x10        pelvic tilt Supine h/l x10-2 x10        SKC Supine h/l Passive  10" x3 passive 10" x4       HS stretch Supine h/l passive 20" x2 passive 30" x2        Cervical Arc seated(no ext) x5-2 x5-2       upper cervical flxn stretch supine 10" x4 10" x4                                                 Patient Education:   Patient was educated on HEP routine.    Patient did verbalize and demonstrate an understanding of this education.   Home Exercise Program was issued today.[name]    EVALUATION AND DIAGNOSIS:   Leslie Gross is a 68 y.o. 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   Pt w/ mod difficulty isolating upper thoracic/medial scapular muscles w/ thera band exercises.  Pt also can't relax bil UTs or cervical erector spinae while doing shoulder/scap exercises.  Due to pt's co-morbidity of myositis--she has pain in bil hips while in supine pos'n.    Post tx pt reports overall feeling better.  Pt w/ min increase in PROM of neck post manual therapy.  Will continue to benefit from skilled PT intervention to improve ROM, flexibility and stability.  Impairments in body function and  structure:  Decreased PROM  Decreased AROM  Decreased Strength   Pain  Impaired Balance  Impaired Gait  Postural Dysfunction  See above review of systems for details.    Activity Limitations / Restricted Participation:  Headaches at least once/week when the neck gets really tight and sore.   Difficulty w/ turning head to drive due to neck pain and stiffness--has to turn her body to drive safely.   Can't stand 15-20 min on a good day in the kitchen due to back pain and weakness.   Back  pain w/ unloading/loading dishwasher, folding laundry, making bed etc.   Not able to get a good night's rest due to neck pain.   Pain w/ ADLs and does activities slowly due to neck and back pain.     PLAN:   Patient requires continued skilled intervention in order to decrease pain. and meet above mentioned functional goals.  Focus on manual therapy, therex, more focus on lumbar dx next visit.    Therapist Signature:    [name], PT, MPT [phone]  Outpatient Speciality Rehab  Physical Medicine and Rehabilitation  [facility]  P: [phone]     06/28/2019

## 2019-06-29 ENCOUNTER — Ambulatory Visit: Payer: Medicare Other

## 2019-06-30 ENCOUNTER — Ambulatory Visit: Payer: Medicare Other

## 2019-06-30 DIAGNOSIS — M6281 Muscle weakness (generalized): Secondary | ICD-10-CM

## 2019-06-30 DIAGNOSIS — M542 Cervicalgia: Secondary | ICD-10-CM

## 2019-06-30 DIAGNOSIS — G4486 Cervicogenic headache: Secondary | ICD-10-CM

## 2019-06-30 NOTE — Progress Notes (Signed)
[facility]  [address], [address]  [place], [place]  [address]  Phone:  [phone]  Fax:  [phone]    PHYSICAL THERAPY DAILY TREATMENT NOTE    PATIENT: Leslie Gross DOB: [birth year]   MR #: [id number]  AGE: 68 y.o.    FACILITY PROVIDER #: [id number] PRIMARY MD: Christel Mormon, MD    HICN# Medicare Sub. 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];Abnormal reflex [R29.2]      Date of Service PT Received On: 06/23/19   Treatment Time Start Time: 1102 to Stop Time: 1200   Time Calculation Time Calculation (min): 58 min   Visit # PT Visit  PT Visit Number: 4   Units Billed   Therapeutic Interventions  $ PT Therapeutic Exercise (97110): 3 Units(45 min)  $ PT Manual Therapy (97140): 1 Unit(13 min)     [name] referred for physical therapy services by: Shane Crutch, MD    Certification period, precautions, medications and allergies and goals copied from initial evaluation - reviewed and reconciled today.  [name], PT 06/30/2019     Certification Period:  06/16/19 to 09/10/19    Treatment Diagnosis: Neck Pain [M54.2], Painful Cervical ROM [M54.2],Chronic Lower Back Pain without Radiculopathy [M54.5, G89.29, Generalized Muscle Weakness [M62.81] andcervicogenic headache R51    Date of onset: 2018  Precautions: Fall risk, h/o Osteoporosis, polymyositis  Imaging Performed:  No recent images of neck in Epic.  Possible X-ray result at spine specialist.    Goals:  Short Term Goals:  To be met by 8 visits  1.  Pt will be 100% compliant w/ HEP routine to progress w/ ROM, strength and stability.  2.  Pt will have at least 5 deg improvement in cervical ROM to allow pt to progress towards turning  head safely while driving.  3.  NDI and Oswestry score will have >5 point improvement indicating improved function and decreased pain.    Long Term Goals:  To be met by 24 visitis  4.  Improve cervical sidebend to at least 5 degrees to allow pt to have no > 1-2 hr sleep loss at night due to neck pain.  5.  Pt will have improved hip strength by 1 grade to allow pt to do household chores w/ less back pain.  6.  Improved pt's current functional status by at least 75% .  7.  ABC score will be >80% indicating improved mobility and balance.  8.  NDI and Oswestry will be < 40 % indicating improved function and decreased pain.   Working Toward the Above Goals: (copied from last visit):    Education and Infection Prevention Regarding Covid-19 Pandemic:  Patient was educated on [employer] and Procedure regarding COVID-19 care and safety precautions. Discussed and reviewed patient's medical history and personal risk factors and patient acknowledges exposure risk and wishes to proceed with care.  IONGE95 Screen: Patient does not present with a temperature >/= 100.0, no new cough, no new throat pain, and no new shortness of breath.   Personal Protective Equipment Utilized:  Gloves, Goggles and Procedure Mask    EXAMINATION:  Subjective Report: Pt reports that her neck gets really stiff when she moves it.  Tried to do the shoulder exercises w/ green band that she did in the clinic last session--had  more shoulder pain.    Pain:  Neck pain 3-4/10 at rest this morning; at rest 0/10  Low back pain 1-2/10 today.    Objective Findings today: Palpation: +TTP bil cervical erector spinae, bil occiput, mod tight bil upper cervical erector spinae, bil UTs.    INTERVENTION:  Treatment Performed:  MANUAL THERAPY : STM and MFR to neck, SCM, and bil UTs; occipital release, PROM into flxn, rot'n and SB, upper cervical rot'n, and grade I p/a glides to cervical spine-- all to increase ROM, decrease pain.  THERAPEUTIC ACTIVITIES (5 min) no  charge: discussed posture correction, keeping bil shoulders relaxed vs shrugging.  THERAPEUTIC EXERCISES : 1:1 per exercise flow sheet to improve flexibility, ROM, strength, stability, balance, functional mobility.  VCs and TCs provided to perform exercise correctly.         Exercise Specifics Date   06/23/19 Date 06/28/19 Date 06/29/09 Date Date Date   UBE Fwd/retro - Retro 3' -      Nu step LE 7/UE 10 - - 10'      Calf stretch wedge   30" x3      Rows stnding - yel TB x10-2 yel TB x10-2      shldr ext " - Yel TB x10-2 yel TB x10-2      Cervical retraction supine 5 sec x10-2 5 sec x10 5sec x10      LTR  x10 -2 x10  x10 HEP     pelvic tilt Supine h/l x10-2 x10  x10      articulating bridge Supine h/l - - x10      Knee folds (alt) Supine h/l - - X10; Red TB x10      clamshells  '             " - - Red TB x10      SKC Supine h/l Passive  10" x3 passive 10" x4 10" x3 (active w/ pillow cov)      HS stretch Supine h/l passive 20" x2 passive 30" x2  active 30" x2      Cervical Arc seated(no ext) x5-2 x5-2 HEP      upper cervical flxn stretch supine 10" x4 10" x4 HEP                                                Patient Education:   Patient was educated on HEP routine.    Patient did verbalize and demonstrate an understanding of this education.   Home Exercise Program was issued today.[name]    EVALUATION AND DIAGNOSIS:   Leslie Gross is a 68 y.o. 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    Pt w/ mod difficulty isolating upper thoracic/medial scapular muscles w/ thera band exercises.  Pt able to better relax bil UTs or cervical erector spinae  while doing shoulder/scap exercises.    Due to pt's co-morbidity of myositis--she has pain in bil hips while in supine pos'n.  Pt not able to lift hips off mat for articulating bridge due to back pain. Pt w/ no tightness in neck muscles post manual therapy.    Post tx pt reports overall feeling  better.  Pt w/ min increase in PROM of neck post manual therapy.  Will continue to benefit from skilled PT intervention to improve ROM, flexibility and stability.    Impairments in body function and structure:  Decreased PROM  Decreased AROM  Decreased Strength   Pain  Impaired Balance  Impaired Gait  Postural Dysfunction  See above review of systems for details.    Activity Limitations / Restricted Participation:  Headaches at least once/week when the neck gets really tight and sore.   Difficulty w/ turning head to drive due to neck pain and stiffness--has to turn her body to drive safely.   Can't stand 15-20 min on a good day in the kitchen due to back pain and weakness.   Back pain w/ unloading/loading dishwasher, folding laundry, making bed etc.   Not able to get a good night's rest due to neck pain.   Pain w/ ADLs and does activities slowly due to neck and back pain.     PLAN:   Patient requires continued skilled intervention in order to decrease pain. and meet above mentioned functional goals.  Focus on manual therapy, therex, more focus on lumbar dx next visit.    Therapist Signature:    [name], PT, MPT [phone]  Outpatient Speciality Rehab  Physical Medicine and Rehabilitation  [facility]  P: [phone]     06/30/2019

## 2019-07-01 ENCOUNTER — Ambulatory Visit: Payer: Medicare Other

## 2019-07-05 ENCOUNTER — Ambulatory Visit: Payer: Medicare Other

## 2019-07-05 DIAGNOSIS — M542 Cervicalgia: Secondary | ICD-10-CM

## 2019-07-05 DIAGNOSIS — G4486 Cervicogenic headache: Secondary | ICD-10-CM

## 2019-07-05 DIAGNOSIS — M6281 Muscle weakness (generalized): Secondary | ICD-10-CM

## 2019-07-05 NOTE — Progress Notes (Signed)
[facility]  [address]  [place], [place]  [address]  Phone:  [phone]  Fax:  [phone]    PHYSICAL THERAPY DAILY TREATMENT NOTE    PATIENT: Leslie Gross DOB: 1951-01-15   MR #: [id number]  AGE: 68 y.o.    FACILITY PROVIDER #: [id number] PRIMARY MD: Christel Mormon, MD    HICN# Medicare Sub. 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];Abnormal reflex [R29.2]      Date of Service PT Received On: 07/05/19   Treatment Time Start Time: 1104 to Stop Time: 1200   Time Calculation Time Calculation (min): 56 min   Visit # PT Visit  PT Visit Number: 5   Units Billed   Therapeutic Interventions  $ PT Therapeutic Exercise (97110): 3 Units(40 min)  $ PT Manual Therapy (97140): 1 Unit(15 min)     [name] referred for physical therapy services by: Shane Crutch, MD    Certification period, precautions, medications and allergies and goals copied from initial evaluation - reviewed and reconciled today.  [name], PT 07/05/2019     Certification Period:  06/16/19 to 09/10/19    Treatment Diagnosis: Neck Pain [M54.2], Painful Cervical ROM [M54.2],Chronic Lower Back Pain without Radiculopathy [M54.5, G89.29, Generalized Muscle Weakness [M62.81] andcervicogenic headache R51    Date of onset: 2018  Precautions: Fall risk, h/o Osteoporosis, polymyositis  Imaging Performed:  No recent images of neck in Epic.  Possible X-ray result at spine specialist.    Goals:  Short Term Goals:  To be met by 8 visits  1.  Pt will be 100% compliant w/ HEP routine to progress w/ ROM, strength and stability.  2.  Pt will have at least 5 deg improvement in cervical ROM to allow pt to progress towards turning  head safely while driving.  3.  NDI and Oswestry score will have >5 point improvement indicating improved function and decreased pain.    Long Term Goals:  To be met by 24 visitis  4.  Improve cervical sidebend to at least 5 degrees to allow pt to have no > 1-2 hr sleep loss at night due to neck pain.  5.  Pt will have improved hip strength by 1 grade to allow pt to do household chores w/ less back pain.  6.  Improved pt's current functional status by at least 75% .  7.  ABC score will be >80% indicating improved mobility and balance.  8.  NDI and Oswestry will be < 40 % indicating improved function and decreased pain.   Working Toward the Above Goals: (copied from last visit):    Education and Infection Prevention Regarding Covid-19 Pandemic:  Patient was educated on [employer] and Procedure regarding COVID-19 care and safety precautions. Discussed and reviewed patient's medical history and personal risk factors and patient acknowledges exposure risk and wishes to proceed with care.  IONGE95 Screen: Patient does not present with a temperature >/= 100.0, no new cough, no new throat pain, and no new shortness of breath.   Personal Protective Equipment Utilized:  Gloves, Goggles and Procedure Mask    EXAMINATION:  Subjective Report: Back is bothering her more today. Neck is still sore. Pt reports that she has pain in both ant upper arm w/ the resisted band exercises.  (pointing to bil  biceps tendon area)    Pain:  Neck pain 3/10 at rest this morning; at rest 0/10  Low back pain 4/10 today.    Objective Findings today: Palpation: +TTP bil cervical erector spinae, bil occiput, mod tight bil upper cervical erector spinae.    INTERVENTION:  Treatment Performed:  MANUAL THERAPY : STM cervical erector spine, SCM; occipital release, MFR to occpiut, PROM into flxn, rot'n and SB, upper cervical rot'n, and grade I p/a glides to cervical spine-- all to increase ROM, decrease pain.  THERAPEUTIC ACTIVITIES (5 min) no charge:  discussed posture correction, keeping bil shoulders relaxed vs shrugging.  THERAPEUTIC EXERCISES : 1:1 per exercise flow sheet to improve flexibility, ROM, strength, stability, balance, functional mobility.  VCs and TCs provided to perform exercise correctly.         Exercise Specifics Date   06/23/19 Date 06/28/19 Date 06/29/09 Date 07/05/19 Date Date   UBE Fwd/retro - Retro 3' - -     Nu step LE 7/UE 10 - - 10' 10' w/ MH to back and neck     Calf stretch wedge   30" x3 30" x3     Rows stnding - yel TB x10-2 yel TB x10-2 yel TB x11 -2     shldr ext " - Yel TB x10-2 yel TB x10-2 yel TB x11-2     scap clocks - - - - Post x10     Cervical retraction supine 5 sec x10-2 5 sec x10 5sec x10 5 sec x10     LTR  x10 -2 x10  x10 HEP     pelvic tilt Supine h/l x10-2 x10  x10 x10 HEP    articulating bridge Supine h/l - - x10 x10     Knee folds (alt) Supine h/l - - X10; Red TB x10 Red TB x12     clamshells  '             " - - Red TB x10 Red TB x12 Grn TB    SKC Supine h/l Passive  10" x3 passive 10" x4 10" x3 (active w/ pillow cov) 10" x2 w/ towel     HS stretch Supine h/l passive 20" x2 passive 30" x2  active 30" x2 passive 30" x2     Cervical Arc seated(no ext) x5-2 x5-2 HEP HEP     upper cervical flxn stretch supine 10" x4 10" x4 HEP HEP                                               Patient Education:   Patient was educated on continuing to focus on posture and relaxing shoulders.    Patient did verbalize and demonstrate an understanding of this education.   Home Exercise Program was issued today.[name]    EVALUATION AND DIAGNOSIS:   Leslie Gross is a 68 y.o. 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    Pt w/ min difficulty isolating upper thoracic/medial scapular muscles w/ thera band exercises, better isolating today.  Pt able to better relax bil UTs or cervical erector spinae while doing shoulder/scap exercises.    Due to pt's co-morbidity of myositis--she has  pain in bil hips while in supine pos'n.  Pt not able to lift hips off mat for articulating bridge due to back pain.   Post tx pt reports overall feeling better.  Pt w/ min increase in PROM of neck post manual therapy.  Will continue to benefit from skilled PT intervention to improve ROM, flexibility and stability.    Impairments in body function and structure:  Decreased PROM  Decreased AROM  Decreased Strength   Pain  Impaired Balance  Impaired Gait  Postural Dysfunction    Activity Limitations / Restricted Participation:  Headaches at least once/week when the neck gets really tight and sore.   Difficulty w/ turning head to drive due to neck pain and stiffness--has to turn her body to drive safely.   Can't stand 15-20 min on a good day in the kitchen due to back pain and weakness.   Back pain w/ unloading/loading dishwasher, folding laundry, making bed etc.   Not able to get a good night's rest due to neck pain.   Pain w/ ADLs and does activities slowly due to neck and back pain.     PLAN:   Patient requires continued skilled intervention in order to decrease pain. and meet above mentioned functional goals.  Focus on manual therapy, therex, more focus on lumbar dx next visit. Measure cervical ROM next visit.    Therapist Signature:    [name], PT, MPT [phone]  Outpatient Speciality Rehab  Physical Medicine and Rehabilitation  [facility]  P: [phone]     07/05/2019

## 2019-07-06 ENCOUNTER — Ambulatory Visit: Payer: Medicare Other

## 2019-07-07 ENCOUNTER — Ambulatory Visit: Payer: Medicare Other

## 2019-07-07 DIAGNOSIS — M4802 Spinal stenosis, cervical region: Secondary | ICD-10-CM

## 2019-07-07 DIAGNOSIS — M5020 Other cervical disc displacement, unspecified cervical region: Secondary | ICD-10-CM

## 2019-07-07 DIAGNOSIS — G8929 Other chronic pain: Secondary | ICD-10-CM

## 2019-07-07 DIAGNOSIS — M542 Cervicalgia: Secondary | ICD-10-CM

## 2019-07-07 DIAGNOSIS — M5412 Radiculopathy, cervical region: Secondary | ICD-10-CM

## 2019-07-07 DIAGNOSIS — G4486 Cervicogenic headache: Secondary | ICD-10-CM

## 2019-07-07 DIAGNOSIS — M6281 Muscle weakness (generalized): Secondary | ICD-10-CM

## 2019-07-07 NOTE — Progress Notes (Addendum)
[facility]  [address], [address]  [place], [place]  [address]  Phone:  [phone]  Fax:  [phone]    PHYSICAL THERAPY DAILY TREATMENT NOTE    PATIENT: Leslie Gross DOB: 07/01/1951   MR #: [id number]  AGE: 68 y.o.    FACILITY PROVIDER #: [id number] PRIMARY MD: Christel Mormon, MD    HICN# Medicare Sub. 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];Abnormal reflex [R29.2]      Date of Service PT Received On: 07/07/19   Treatment Time Start Time: 1103 to Stop Time: 1158   Time Calculation Time Calculation (min): 55 min   Visit # PT Visit  PT Visit Number: 6   Units Billed   Therapeutic Interventions  $ PT Therapeutic Exercise (97110): 2 Units(28 min)  $ PT Manual Therapy (97140): 1 Unit(18 min)  $ PT Therapeutic Activity (97530): 1 unit(8 min)     [name] referred for physical therapy services by: Shane Crutch, MD    Certification period, precautions, medications and allergies and goals copied from initial evaluation - reviewed and reconciled today.  [name], PT 07/07/2019     Certification Period:  06/16/19 to 09/10/19    Treatment Diagnosis: Neck Pain [M54.2], Painful Cervical ROM [M54.2],Chronic Lower Back Pain without Radiculopathy [M54.5, G89.29, Generalized Muscle Weakness [M62.81] andcervicogenic headache R51    Date of onset: 2018  Precautions: Fall risk, h/o Osteoporosis, polymyositis  Imaging Performed:  No recent images of neck in Epic.  Possible X-ray result at spine specialist.    Goals:  Short Term Goals:  To be met by 8 visits  1.  Pt will be 100% compliant w/ HEP routine to progress w/ ROM, strength and stability.  2.  Pt will have at least 5 deg improvement in  cervical ROM to allow pt to progress towards turning head safely while driving.  3.  NDI and Oswestry score will have >5 point improvement indicating improved function and decreased pain.    Long Term Goals:  To be met by 24 visitis  4.  Improve cervical sidebend to at least 5 degrees to allow pt to have no > 1-2 hr sleep loss at night due to neck pain.  5.  Pt will have improved hip strength by 1 grade to allow pt to do household chores w/ less back pain.  6.  Improved pt's current functional status by at least 75% .  7.  ABC score will be >80% indicating improved mobility and balance.  8.  NDI and Oswestry will be < 40 % indicating improved function and decreased pain.   Working Toward the Above Goals: (copied from last visit):    Education and Infection Prevention Regarding Covid-19 Pandemic:  Patient was educated on [employer] and Procedure regarding COVID-19 care and safety precautions. Discussed and reviewed patient's medical history and personal risk factors and patient acknowledges exposure risk and wishes to proceed with care.  [id number] Screen: Patient does not present with a temperature >/= 100.0, no new cough, no new throat pain, and no new shortness of breath.   Personal Protective Equipment Utilized:   Gloves, Goggles and Procedure Mask    EXAMINATION:  Subjective Report: She didn't take her migraine meds monday night so didn't get enough rest--so yesterday was a bad day for her.  Monday she tried to use a massager w/ heat on her neck and didn't feel good w/ it but it did help her.  She did a lot of exercise for her neck during the day yesterday to get her neck relaxed but didn't happen.  She took Tyelenol pm last night therefore got enugh sleep but neck is really tight --feels it to the top of head.  Pt reports that her daily HAs have decreased.  Took Soma this morning to get her neck relaxed.      Pain:  Neck pain 3/10 w/ movement, 2/10 at rest this morning;   Low back pain 2/10  today.    Objective Findings today: Palpation: +TTP bil cervical erector spinae, bil occiput, mod tight bil upper cervical erector spinae.    INTERVENTION:  Treatment Performed:  MANUAL THERAPY: STM cervical erector spine, SCM; occipital release, MFR to occpiut, PROM into flxn, rot'n and SB, upper cervical rot'n, and grade I p/a glides to cervical spine-- all to increase ROM, decrease pain.  THERAPEUTIC ACTIVITIES: discussed posture correction, keeping bil shoulders relaxed vs shrugging; standing cervical neutral pos'n vs chin tuck.  Sleeping posture reviewed/practiced for supine and side lying for proper neck support.  Practiced self occipital release using the occipivot.  THERAPEUTIC EXERCISES : 1:1 per exercise flow sheet to improve flexibility, ROM, strength, stability, balance, functional mobility.  VCs and TCs provided to perform exercise correctly.         Exercise Specifics Date   06/23/19 Date 06/28/19 Date 06/29/09 Date 07/05/19 Date 07/07/19 Date   UBE Fwd/retro - Retro 3' - -     Nu step LE 7/UE 10 - - 10' 10' w/ MH to back and neck -    Calf stretch wedge   30" x3 30" x3 30" x3    Rows stnding - yel TB x10-2 yel TB x10-2 yel TB x11 -2 yel TB x12-2    shldr ext " - Yel TB x10-2 yel TB x10-2 yel TB x11-2 yel TB x12-2    scap clocks - - - - Post x10 x10    Cervical retraction supine 5 sec x10-2 5 sec x10 5sec x10 5 sec x10 5 sec x10    LTR  x10 -2 x10  x10 HEP HEP    pelvic tilt Supine h/l x10-2 x10  x10 x10 HEP    articulating bridge Supine h/l - - x10 x10 x10-2    Knee folds (alt) Supine h/l - - X10; Red TB x10 Red TB x12 Grn TB x10, 12    clamshells  '             " - - Red TB x10 Red TB x12 Grn TB: x10, 12    SKC Supine h/l Passive  10" x3 passive 10" x4 10" x3 (active w/ pillow cov) 10" x2 w/ towel 10" x3 w/ towel    HS stretch Supine h/l passive 20" x2 passive 30" x2  active 30" x2 passive 30" x2 -    Cervical Arc seated(no ext) x5-2 x5-2 HEP HEP HEP    upper  cervical flxn stretch supine 10" x4 10" x4 HEP HEP HEP                                              Patient Education:   Patient was educated on continuing to focus  on posture and relaxing shoulders.    Patient did verbalize and demonstrate an understanding of this education.   Home Exercise Program was reviewed today.[name]    EVALUATION AND DIAGNOSIS:   Leslie Gross is a 68 y.o. 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    Pt w/ min difficulty isolating upper thoracic/medial scapular muscles w/ thera band exercises, better isolating today.  Pt able to better relax bil UTs or cervical erector spinae while doing shoulder/scap exercises.   Pt able to be in supine and side lying pos'n w/ good neck support once practiced in clinic using pillow and 2 towels.  Post tx pt reports overall feeling better.  Will continue to benefit from skilled PT intervention to improve ROM, flexibility and stability.  Due to pt's co-morbidity of myositis--she has pain in bil hips while in supine pos'n.  Pt not able to lift hips off mat for articulating bridge due to back pain and weakness.   Pt will continue to benefit from hip strengthening and scapula stabilization exercises.    Impairments in body function and structure:  Decreased PROM  Decreased AROM  Decreased Strength   Pain  Impaired Balance  Impaired Gait  Postural Dysfunction    Activity Limitations / Restricted Participation:  Headaches at least once/week when the neck gets really tight and sore.   Difficulty w/ turning head to drive due to neck pain and stiffness--has to turn her body to drive safely.   Can't stand 15-20 min on a good  day in the kitchen due to back pain and weakness.   Back pain w/ unloading/loading dishwasher, folding laundry, making bed etc.   Not able to get a good night's rest due to neck pain.   Pain w/ ADLs and does activities slowly due to neck and back pain.     PLAN:   Patient requires continued skilled intervention in order to decrease pain. and meet above mentioned functional goals.  Focus on manual therapy, therex, more focus on lumbar dx next visit. Measure cervical ROM next visit.    Therapist Signature:    [name], PT, MPT [phone]  Outpatient Speciality Rehab  Physical Medicine and Rehabilitation  [facility]  P: [phone]     07/07/2019

## 2019-07-08 ENCOUNTER — Ambulatory Visit: Payer: Medicare Other

## 2019-07-12 ENCOUNTER — Ambulatory Visit: Payer: Medicare Other

## 2019-07-12 DIAGNOSIS — M545 Low back pain, unspecified: Secondary | ICD-10-CM

## 2019-07-12 DIAGNOSIS — G4486 Cervicogenic headache: Secondary | ICD-10-CM

## 2019-07-12 DIAGNOSIS — M542 Cervicalgia: Secondary | ICD-10-CM

## 2019-07-12 DIAGNOSIS — M6281 Muscle weakness (generalized): Secondary | ICD-10-CM

## 2019-07-12 NOTE — Progress Notes (Signed)
[facility]  [address]  [place], [place]  [address]  Phone:  [phone]  Fax:  [phone]    PHYSICAL THERAPY DAILY TREATMENT NOTE    PATIENT: Leslie Gross DOB: 12-17-1950   MR #: [id number]  AGE: 68 y.o.    FACILITY PROVIDER #: [id number] PRIMARY MD: Christel Mormon, MD    HICN# Medicare Sub. 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];Abnormal reflex [R29.2]      Date of Service PT Received On: 07/12/19   Treatment Time Start Time: 1105 to Stop Time: 1200   Time Calculation Time Calculation (min): 55 min   Visit # PT Visit  PT Visit Number: 7   Units Billed   Therapeutic Interventions  $ PT Therapeutic Exercise (97110): 2 Units(30 min)  $ PT Manual Therapy (97140): 1 Unit(15 min)  $ PT Neuromuscular Re-education (24401): 1 Unit(8 min)     [name] referred for physical therapy services by: Shane Crutch, MD    Certification period, precautions, medications and allergies and goals copied from initial evaluation - reviewed and reconciled today.  [name], PT 07/12/2019     Certification Period:  06/16/19 to 09/10/19    Treatment Diagnosis: Neck Pain [M54.2], Painful Cervical ROM [M54.2],Chronic Lower Back Pain without Radiculopathy [M54.5, G89.29, Generalized Muscle Weakness [M62.81] andcervicogenic headache R51    Date of onset: 2018  Precautions: Fall risk, h/o Osteoporosis, polymyositis  Imaging Performed:  No recent images of neck in Epic.  Possible X-ray result at spine specialist.    Goals:  Short Term Goals:  To be met by 8 visits  1.  Pt will be 100% compliant w/ HEP routine to progress w/ ROM, strength and stability.  2.  Pt will have at least 5 deg improvement  in cervical ROM to allow pt to progress towards turning head safely while driving.  3.  NDI and Oswestry score will have >5 point improvement indicating improved function and decreased pain.    Long Term Goals:  To be met by 24 visitis  4.  Improve cervical sidebend to at least 5 degrees to allow pt to have no > 1-2 hr sleep loss at night due to neck pain.  5.  Pt will have improved hip strength by 1 grade to allow pt to do household chores w/ less back pain.  6.  Improved pt's current functional status by at least 75% .  7.  ABC score will be >80% indicating improved mobility and balance.  8.  NDI and Oswestry will be < 40 % indicating improved function and decreased pain.   Working Toward the Above Goals: (copied from last visit):    Education and Infection Prevention Regarding Covid-19 Pandemic:  Patient was educated on [employer] and Procedure regarding COVID-19 care and safety precautions. Discussed and reviewed patient's medical history and personal risk factors and patient acknowledges exposure risk and wishes to proceed with care.  [id number] Screen: Patient does not present with a temperature >/= 100.0, no new cough, no new throat pain, and no new shortness of breath.   Personal Protective Equipment Utilized:   Gloves, Goggles and Procedure Mask    EXAMINATION:  Subjective Report: Pt reported that she was having some sinus issues over the weekend so she took it easy, took meds an  rested.  Hasn't had a HA from her neck since last tx session.  Neck is feeling much better--feels like she can look over her shoulder more.     Pain:  Neck pain 3/10 w/ movement, 1-2/10 at rest this morning;   Low back pain 2/10 today.    Objective Findings today: AROM: cervical rot'n Lt 42 deg, Rt 53 deg (w/ pain of 1-2/10 on same side)  Palpation: +TTP bil occiput, mintight bil upper cervical erector spinae.    INTERVENTION:  Treatment Performed:  MANUAL THERAPY: STM cervical erector spine, SCM; occipital release, MFR to  occpiut, PROM into flxn, rot'n and SB, upper cervical rot'n, and CRC to increase rot'n to both sides-- all to increase ROM, decrease pain.  THERAPEUTIC ACTIVITIES: (no charge) discussed posture correction, keeping bil shoulders relaxed vs shrugging; standing cervical neutral pos'n vs chin tuck.    THERAPEUTIC EXERCISES : 1:1 per exercise flow sheet to improve flexibility, ROM, strength, stability, balance, functional mobility.  VCs and TCs provided to perform exercise correctly.         Exercise Specifics Date   06/23/19 Date 06/28/19 Date 06/29/09 Date 07/05/19 Date 07/07/19 Date 07/12/19   UBE Fwd/retro - Retro 3' - -  -   Nu step LE 7/UE 10 - - 10' 10' w/ MH to  Neck and back - w/ MH to  Neck w/ MH to  Neck post therex and MT   Calf stretch wedge   30" x3 30" x3 30" x3    Rows stnding - yel TB x10-2 yel TB x10-2 yel TB x11 -2 yel TB x12-2 Red TB x10-2   shldr ext " - Yel TB x10-2 yel TB x10-2 yel TB x11-2 yel TB x12-2 Red TB x10-2   scap clocks - - - - Post x10 x10    Cervical retraction supine 5 sec x10-2 5 sec x10 5sec x10 5 sec x10 5 sec x10 HEP   LTR  x10 -2 x10  x10 HEP HEP x10   pelvic tilt Supine h/l x10-2 x10  x10 x10 HEP HEP   articulating bridge Supine h/l - - x10 x10 x10-2 x10-2   Knee folds (alt) Supine h/l - - X10; Red TB x10 Red TB x12 Grn TB x10, 12 Grn TB x15   clamshells  '             " - - Red TB x10 Red TB x12 Grn TB: x10, 12 Grn TB x15   SKC Supine h/l Passive  10" x3 passive 10" x4 10" x3 (active w/ pillow cov) 10" x2 w/ towel 10" x3 w/ towel    HS stretch Supine h/l passive 20" x2 passive 30" x2  active 30" x2 passive 30" x2 - passive 30" x3 Rt/Lt   Cervical Arc seated(no ext) x5-2 x5-2 HEP HEP HEP Cervical rot'n w/ wt shift x5 Rt/Lt   upper cervical flxn stretch supine 10" x4 10" x4 HEP HEP HEP                                              Patient Education:   Patient was educated on continuing to focus on posture and  relaxing shoulders.    Patient did verbalize and demonstrate an understanding of this education.   Home Exercise Program was reviewed today.[name]    EVALUATION AND DIAGNOSIS:  Leslie Gross is a 68 y.o. 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    Pt w/ min difficulty isolating upper thoracic/medial scapular muscles w/ thera band exercises, better isolating today.  Pt able to better relax bil UTs or cervical erector spinae while doing shoulder/scap exercises.   Post tx pt reports overall feeling better and able to turn her head and look at PT while walking.  Will continue to benefit from skilled PT intervention to improve ROM, flexibility and stability.  Due to pt's co-morbidity of myositis--she has pain in bil hips while in supine pos'n.  Pt not able to lift hips off mat for articulating bridge due to back pain and weakness.   Pt will continue to benefit from hip strengthening and scapula stabilization exercises.    Impairments in body function and structure:  Decreased PROM  Decreased AROM  Decreased Strength   Pain  Impaired Balance  Impaired Gait  Postural Dysfunction    Activity Limitations / Restricted Participation:  Headaches at least once/week when the neck gets really tight and sore.  Progressing   Difficulty w/ turning head to drive due to neck pain and stiffness--has to turn her body to drive safely. Improving.   Can't stand 15-20 min on a good day in the kitchen due to back pain and weakness.   Back pain w/ unloading/loading dishwasher, folding laundry, making bed etc.   Not able to get a good night's rest due to neck pain.   Pain w/ ADLs and does activities slowly due to neck and back pain.      PLAN:   Patient requires continued skilled intervention in order to decrease pain. and meet above mentioned functional goals.  Focus on manual therapy, therex, more focus on lumbar dx next visit. Update HEP routine for back.    Therapist Signature:    [name], PT, MPT [phone]  Outpatient Speciality Rehab  Physical Medicine and Rehabilitation  [facility]  P: [phone]     07/12/2019

## 2019-07-13 ENCOUNTER — Ambulatory Visit: Payer: Medicare Other

## 2019-07-14 ENCOUNTER — Ambulatory Visit: Payer: Medicare Other

## 2019-07-14 DIAGNOSIS — M5412 Radiculopathy, cervical region: Secondary | ICD-10-CM

## 2019-07-14 DIAGNOSIS — M6281 Muscle weakness (generalized): Secondary | ICD-10-CM

## 2019-07-14 DIAGNOSIS — M4802 Spinal stenosis, cervical region: Secondary | ICD-10-CM

## 2019-07-14 NOTE — Progress Notes (Signed)
[facility]  [address], [address]  [place], [place]  [address]  Phone:  [phone]  Fax:  [phone]    PHYSICAL THERAPY DAILY TREATMENT NOTE    PATIENT: Leslie Gross DOB: [birth year]   MR #: [id number]  AGE: 68 y.o.    FACILITY PROVIDER #: [id number] PRIMARY MD: Christel Mormon, MD    HICN# Medicare Sub. 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  [name], PT 07/14/2019     Certification Period:  06/16/19 to 09/10/19    Treatment Diagnosis: Neck Pain [M54.2], Painful Cervical ROM [M54.2],Chronic Lower Back Pain without Radiculopathy [M54.5, G89.29, Generalized Muscle Weakness [M62.81] andcervicogenic headache R51    Date of onset: 2018  Precautions: Fall risk, h/o Osteoporosis, polymyositis  Imaging Performed:  No recent images of neck in Epic.  Possible X-ray result at spine specialist.    Goals:  Short Term Goals:  To be met by 8 visits  1.  Pt will be 100% compliant w/ HEP routine to progress w/ ROM, strength and stability.  2.  Pt will have at least 5 deg improvement in cervical ROM to allow pt to progress towards turning  head safely while driving.  3.  NDI and Oswestry score will have >5 point improvement indicating improved function and decreased pain.    Long Term Goals:  To be met by 24 visitis  4.  Improve cervical sidebend to at least 5 degrees to allow pt to have no > 1-2 hr sleep loss at night due to neck pain.  5.  Pt will have improved hip strength by 1 grade to allow pt to do household chores w/ less back pain.  6.  Improved pt's current functional status by at least 75% .  7.  ABC score will be >80% indicating improved mobility and balance.  8.  NDI and Oswestry will be < 40 % indicating improved function and decreased pain.   Working Toward the Above Goals: (copied from last visit):    Education and Infection Prevention Regarding Covid-19 Pandemic:  Patient was educated on [employer] and Procedure regarding COVID-19 care and safety precautions. Discussed and reviewed patient's medical history and personal risk factors and patient acknowledges exposure risk and wishes to proceed with care.  IONGE95 Screen: Patient does not present with a temperature >/= 100.0, no new cough, no new throat pain, and no new shortness of breath.   Personal Protective Equipment Utilized:   Gloves, Goggles and Procedure Mask    EXAMINATION:  Subjective Report: Pt reported that her husband wasn't doing well this morning -cognitively-and that got her very stressed.  Pain:  Neck pain 3/10 w/ movement, 1-2/10 at rest this morning;  Low back pain 2/10 today.    Objective Findings today: not tested  Palpation: +TTP bil occiput, mod tightness in bil cervical erector spinae, min tight bil upper cervical erector spinae.    INTERVENTION:  Treatment Performed:  MANUAL THERAPY: STM cervical erector spine, SCM; occipital release, MFR to occpiut, PROM into flxn, rot'n and SB, upper cervical rot'n, and CRC to increase rot'n to both sides-- all to increase ROM, decrease pain.  THERAPEUTIC ACTIVITIES: (no charge) discussed posture correction, keeping bil  shoulders relaxed vs shrugging; standing cervical neutral pos'n vs chin tuck, walking w/ arm swing and relaxed shoulder.  Discussed taking breaks whenever she can.  THERAPEUTIC EXERCISES : 1:1 per exercise flow sheet to improve flexibility, ROM, strength, stability, balance, functional mobility.  VCs and TCs provided to perform exercise correctly.         Exercise Specifics Date   06/23/19 Date 06/28/19 Date 06/29/09 Date 07/05/19 Date 07/07/19 Date 07/12/19 Date 07/14/19   UBE Fwd/retro - Retro 3' - -  -    Nu step LE 7/UE 10 - - 10' 10' w/ MH to  Neck and back - w/ MH to  Neck w/ MH to  Neck post therex and MT w/ MH to  Neck w/ MH to  Neck post therex and MT (no UE)   Calf stretch wedge   30" x3 30" x3 30" x3  -   Rows stnding - yel TB x10-2 yel TB x10-2 yel TB x11 -2 yel TB x12-2 Red TB x10-2 -   shldr ext " - Yel TB x10-2 yel TB x10-2 yel TB x11-2 yel TB x12-2 Red TB x10-2 -   scap clocks - - - - Post x10 x10     Cervical retraction supine 5 sec x10-2 5 sec x10 5sec x10 5 sec x10 5 sec x10 HEP    LTR  x10 -2 x10  x10 HEP HEP x10    pelvic tilt Supine h/l x10-2 x10  x10 x10 HEP HEP    articulating bridge Supine h/l - - x10 x10 x10-2 x10-2    Knee folds (alt) Supine h/l - - X10; Red TB x10 Red TB x12 Grn TB x10, 12 Grn TB x15    clamshells  '             " - - Red TB x10 Red TB x12 Grn TB: x10, 12 Grn TB x15    SKC Supine h/l Passive  10" x3 passive 10" x4 10" x3 (active w/ pillow cov) 10" x2 w/ towel 10" x3 w/ towel     HS stretch Supine h/l passive 20" x2 passive 30" x2  active 30" x2 passive 30" x2 - passive 30" x3 Rt/Lt    Cervical Arc seated(no ext) x5-2 x5-2 HEP HEP HEP Cervical rot'n w/ wt shift x5 Rt/Lt    upper cervical flxn stretch supine 10" x4 10" x4 HEP HEP HEP                                                   Patient Education:   Patient was educated on continuing to focus on posture and relaxing shoulders.    Patient did  verbalize and demonstrate an understanding of this education.   Home Exercise Program was reviewed today.[name]    EVALUATION AND DIAGNOSIS:   Leslie Gross is a 68 y.o. 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    Pt very stiff today and pain w/ turning head--this is most likely due to pt tendency to tighten up w/ stress.   Post tx pt reports overall feeling better and able to turn her head.  Will continue to benefit from skilled PT intervention to improve ROM, flexibility and stability.  Due to pt's co-morbidity of myositis--she has pain in bil hips while in supine pos'n.  Pt not able to lift hips off mat for articulating bridge due to back pain and weakness.   Pt will continue to benefit from hip strengthening and scapula stabilization exercises.    Impairments in body function and structure:  Decreased PROM  Decreased AROM  Decreased Strength   Pain  Impaired Balance  Impaired Gait  Postural Dysfunction    Activity Limitations / Restricted Participation:  Headaches at least once/week when the neck gets really tight and sore.  Progressing   Difficulty w/ turning head to drive due to neck pain and stiffness--has to turn her body to drive safely. Improving.   Can't stand 15-20 min on a good day in the kitchen due to back pain and weakness.   Back pain w/ unloading/loading dishwasher, folding laundry, making bed etc.   Not able to get a good night's rest due to neck pain.   Pain w/ ADLs and does activities slowly due to neck and back pain.     PLAN:   Patient requires continued skilled intervention in order to decrease pain. and meet above mentioned functional goals.  Focus on manual therapy, therex, more focus on  lumbar dx next visit. Update HEP routine for back and neck next session.      Therapist Signature:    [name], PT, MPT [phone]  Outpatient Speciality Rehab  Physical Medicine and Rehabilitation  [facility]  P: [phone]     07/14/2019

## 2019-07-15 ENCOUNTER — Ambulatory Visit: Payer: Medicare Other

## 2019-07-19 ENCOUNTER — Ambulatory Visit: Payer: Medicare Other | Attending: Orthopaedic Surgery

## 2019-07-19 DIAGNOSIS — R519 Headache, unspecified: Secondary | ICD-10-CM | POA: Insufficient documentation

## 2019-07-19 DIAGNOSIS — M4726 Other spondylosis with radiculopathy, lumbar region: Secondary | ICD-10-CM | POA: Insufficient documentation

## 2019-07-19 DIAGNOSIS — M542 Cervicalgia: Secondary | ICD-10-CM | POA: Insufficient documentation

## 2019-07-19 DIAGNOSIS — G4486 Cervicogenic headache: Secondary | ICD-10-CM

## 2019-07-19 DIAGNOSIS — R292 Abnormal reflex: Secondary | ICD-10-CM | POA: Insufficient documentation

## 2019-07-19 DIAGNOSIS — M4316 Spondylolisthesis, lumbar region: Secondary | ICD-10-CM | POA: Insufficient documentation

## 2019-07-19 DIAGNOSIS — M545 Low back pain: Secondary | ICD-10-CM | POA: Insufficient documentation

## 2019-07-19 DIAGNOSIS — M6281 Muscle weakness (generalized): Secondary | ICD-10-CM | POA: Insufficient documentation

## 2019-07-19 DIAGNOSIS — G8929 Other chronic pain: Secondary | ICD-10-CM | POA: Insufficient documentation

## 2019-07-19 NOTE — Progress Notes (Signed)
[facility]  [address]  [place], [place]  [address]  Phone:  [phone]  Fax:  [phone]    PHYSICAL THERAPY DAILY TREATMENT NOTE    PATIENT: Leslie Gross DOB: 1951-09-18   MR #: [id number]  AGE: 68 y.o.    FACILITY PROVIDER #: [id number] PRIMARY MD: Christel Mormon, MD    HICN# Medicare Sub. 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  [name], PT 07/19/2019     Certification Period:  06/16/19 to 09/10/19    Treatment Diagnosis: Neck Pain [M54.2], Painful Cervical ROM [M54.2],Chronic Lower Back Pain without Radiculopathy [M54.5, G89.29, Generalized Muscle Weakness [M62.81] andcervicogenic headache R51    Date of onset: 2018  Precautions: Fall risk, h/o Osteoporosis, polymyositis  Imaging Performed:  No recent images of neck in Epic.  Possible X-ray result at spine specialist.    Goals:  Short Term Goals:  To be met by 8 visits  1.  Pt will be 100% compliant w/ HEP routine to progress w/ ROM, strength and stability.  2.  Pt will have at least 5 deg improvement in cervical ROM to allow pt to progress towards turning  head safely while driving. Progressing  3.  NDI and Oswestry score will have >5 point improvement indicating improved function and decreased pain.    Long Term Goals:  To be met by 24 visitis  4.  Improve cervical sidebend to at least 5 degrees to allow pt to have no > 1-2 hr sleep loss at night due to neck pain.  5.  Pt will have improved hip strength by 1 grade to allow pt to do household chores w/ less back pain.  6.  Improved pt's current functional status by at least 75% .  7.  ABC score will be >80% indicating improved mobility and balance.  8.  NDI and Oswestry will be < 40 % indicating improved function and decreased pain.   Working Toward the Above Goals: (copied from last visit):    Education and Infection Prevention Regarding Covid-19 Pandemic:  Patient was educated on [employer] and Procedure regarding COVID-19 care and safety precautions. Discussed and reviewed patient's medical history and personal risk factors and patient acknowledges exposure risk and wishes to proceed with care.  IONGE95 Screen: Patient does not present with a temperature >/= 100.0, no new cough, no new throat pain, and no new shortness of breath.   Personal Protective Equipment Utilized:   Gloves, Goggles and Procedure Mask    EXAMINATION:  Subjective Report: Pt reported that she had injection on Friday at [facility] and [facility] and felt better on the Right side since then but the left side still is  sore.  Pain:  Neck pain 3/10 on Lt side, 0/10 on Rt side.  Low back pain 2/10 today.    Objective Findings today: not tested  Palpation: +TTP bil occiput, mod tightness in bil cervical erector spinae, min tight bil upper cervical erector spinae.    INTERVENTION:  Treatment Performed:  MANUAL THERAPY: occipital release, MFR to occpiut, PROM into flxn, rot'n and SB, upper cervical rot'n, and CRC to increase rot'n to both sides-- all to increase ROM, decrease pain.  THERAPEUTIC ACTIVITIES: (no charge) discussed posture correction, keeping bil  shoulders relaxed vs shrugging; standing cervical neutral pos'n vs chin tuck, walking w/ arm swing and relaxed shoulder.  Discussed taking breaks whenever she can.  THERAPEUTIC EXERCISES : 1:1 per exercise flow sheet to improve flexibility, ROM, strength, stability, balance, functional mobility.  VCs and TCs provided to perform exercise correctly.         Exercise Specifics Date 06/29/09 Date 07/05/19 Date 07/07/19 Date 07/12/19 Date 07/14/19 Date 07/19/19   Nu step LE 7/UE 10 10' 10' w/ MH to  Neck and back - w/ MH to  Neck w/ MH to  Neck post therex and MT w/ MH to  Neck w/ MH to  Neck post therex and MT (no UE) MH to neck and back; LE only 10/   Calf stretch wedge 30" x3 30" x3 30" x3 - - 30" x3   Rows stnding yel TB x10-2 yel TB x11 -2 yel TB x12-2 Red TB x10-2 - Red TB x10-2   shldr ext " yel TB x10-2 yel TB x11-2 yel TB x12-2 Red TB x10-2 - REd TB x10-2   scap clocks - - Post x10 x10 - -    Cervical retraction supine 5sec x10 5 sec x10 5 sec x10 HEP - 5 sec x10   LTR  x10 HEP HEP x10 --    pelvic tilt Supine h/l x10 x10 HEP HEP - Craniocervical flxn: 3 sec x10   articulating bridge Supine h/l x10 x10 x10-2 x10-2 - x10   Bridge W/ hip abd resitance - - - - - Grn TB x10   Knee folds (alt) Supine h/l X10; Red TB x10 Red TB x12 Grn TB x10, 12 Grn TB x15 - Grn TB x15   clamshells  '             " Red TB x10 Red TB x12 Grn TB: x10, 12 Grn TB x15 - Grn TB x15   SKC Supine h/l 10" x3 (active w/ pillow cov) 10" x2 w/ towel 10" x3 w/ towel  - passive 10" x2   HS stretch Supine h/l active 30" x2 passive 30" x2 - passive 30" x3 Rt/Lt - passive 30" x2   Cervical Arc seated(no ext) HEP HEP HEP Cervical rot'n w/ wt shift x5 Rt/Lt - -                                             Patient Education:   Patient was educated on continuing to focus on posture and relaxing shoulders.  Updated HEP for neck given.  Patient did verbalize and demonstrate an understanding of this education.    Home Exercise Program was updated today.[name]    EVALUATION AND DIAGNOSIS:   Leslie Gross is a 68 y.o. 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    Good challenge w/ all exercise today. Pt w/ c/o Lt eye pain w/ cervical retraction in supine.  No pain w/ craniocervical flexion.  Pt able to turn her head more today-this could be a result of the injections on Friday.  Post tx pt reports overall feeling better and able to turn her head.  Will continue to benefit from skilled PT intervention to improve ROM, flexibility and stability.    Due to pt's co-morbidity of myositis--she has pain in bil hips while in supine pos'n.  Pt not able to lift hips off mat for articulating bridge due to back pain and weakness.   Pt will continue to benefit from hip strengthening and scapula stabilization exercises.    Impairments in body function and structure:  Decreased PROM  Decreased AROM  Decreased Strength   Pain  Impaired Balance  Impaired Gait  Postural Dysfunction    Activity Limitations / Restricted Participation:  Headaches at least once/week when the neck gets really tight and sore.  Progressing   Difficulty w/ turning head to drive due to neck pain and stiffness--has to turn her body to drive safely. Improving.   Can't stand 15-20 min on a good day in the kitchen due to back pain and weakness.   Back pain w/ unloading/loading dishwasher, folding laundry, making bed etc.   Not able to get a good night's rest due to neck pain.   Pain w/ ADLs and does activities slowly due to neck and back pain.     PLAN:   Patient requires continued skilled intervention in order to decrease pain. and meet above mentioned functional  goals.  Focus on manual therapy, therex, more focus on lumbar dx next visit. Update HEP routine for back next session.  Do progress note next session.     Therapist Signature:    [name], PT, MPT [phone]  Outpatient Speciality Rehab  Physical Medicine and Rehabilitation  [facility]  P: [phone]     07/19/2019

## 2019-07-19 NOTE — Progress Notes (Signed)
Craniocervical Flexion    Lying supine, tuck chin as you perform a slight "nodding" motion.   Hold the chin tuck for 3 seconds without tightening up the front of your neck muscles. You should feel the base of your head muscles stretching and the deep neck muscles working.  Perform 10 times, 1 sets. 2x/day

## 2019-07-20 ENCOUNTER — Ambulatory Visit: Payer: Medicare Other

## 2019-07-21 ENCOUNTER — Ambulatory Visit: Payer: Medicare Other

## 2019-07-21 DIAGNOSIS — M6281 Muscle weakness (generalized): Secondary | ICD-10-CM

## 2019-07-21 DIAGNOSIS — M542 Cervicalgia: Secondary | ICD-10-CM

## 2019-07-21 DIAGNOSIS — G4486 Cervicogenic headache: Secondary | ICD-10-CM

## 2019-07-21 NOTE — Progress Notes (Signed)
[facility]  [address], [address]  [place], [place]  [address]  Phone:  [phone]  Fax:  [phone]    PHYSICAL THERAPY DAILY TREATMENT NOTE    PATIENT: Leslie Gross DOB: Nov 04, 1950   MR #: [id number]  AGE: 68 y.o.    FACILITY PROVIDER #: [id number] PRIMARY MD: Christel Mormon, MD    HICN# Medicare Sub. 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  [name], PT 07/21/2019     Certification Period:  06/16/19 to 09/10/19    Treatment Diagnosis: Neck Pain [M54.2], Painful Cervical ROM [M54.2],Chronic Lower Back Pain without Radiculopathy [M54.5, G89.29, Generalized Muscle Weakness [M62.81] andcervicogenic headache R51    Date of onset: 2018  Precautions: Fall risk, h/o Osteoporosis, polymyositis  Imaging Performed:  No recent images of neck in Epic.  Possible X-ray result at spine specialist.    Goals:  Short Term Goals:  To be met by 8 visits  1.  Pt will be 100% compliant w/ HEP routine to progress w/ ROM, strength and stability.  2.  Pt will have at least 5 deg improvement in cervical ROM to allow pt to progress towards turning  head safely while driving. Progressing  3.  NDI and Oswestry score will have >5 point improvement indicating improved function and decreased pain.    Long Term Goals:  To be met by 24 visitis  4.  Improve cervical sidebend to at least 5 degrees to allow pt to have no > 1-2 hr sleep loss at night due to neck pain.  5.  Pt will have improved hip strength by 1 grade to allow pt to do household chores w/ less back pain.  6.  Improved pt's current functional status by at least 75% .  7.  ABC score will be >80% indicating improved mobility and balance.  8.  NDI and Oswestry will be < 40 % indicating improved function and decreased pain.   Working Toward the Above Goals: (copied from last visit):    Education and Infection Prevention Regarding Covid-19 Pandemic:  Patient was educated on [employer] and Procedure regarding COVID-19 care and safety precautions. Discussed and reviewed patient's medical history and personal risk factors and patient acknowledges exposure risk and wishes to proceed with care.  [id number] Screen: Patient does not present with a temperature >/= 100.0, no new cough, no new throat pain, and no new shortness of breath.   Personal Protective Equipment Utilized:   Gloves, Goggles and Procedure Mask    EXAMINATION:  Subjective Report: Pt reported that her neck is the same as last session.  She is very tired today due to spouse's sleeping issues.    Pain:  Neck  pain 3/10 on Lt side, 0/10 on Rt side.  Low back pain 2/10 today.    Objective Findings today: not tested  Palpation: +TTP bil occiput, mod tightness in bil cervical erector spinae, min tight bil upper cervical erector spinae.    INTERVENTION:  Treatment Performed:  MANUAL THERAPY: occipital release, MFR to occpiut, PROM into flxn, rot'n and SB, upper cervical rot'n, and CRC to increase rot'n to both sides-- all to increase ROM, decrease pain.  THERAPEUTIC ACTIVITIES: (no charge) discussed posture correction, keeping bil shoulders relaxed vs  shrugging; standing cervical neutral pos'n vs chin tuck, walking w/ arm swing and relaxed shoulder.  Discussed taking breaks whenever she can.  THERAPEUTIC EXERCISES : 1:1 per exercise flow sheet to improve flexibility, ROM, strength, stability, balance, functional mobility.  VCs and TCs provided to perform exercise correctly.         Exercise Specifics Date 06/29/09 Date 07/05/19 Date 07/07/19 Date 07/12/19 Date 07/14/19 Date 07/19/19 07/21/19   Nu step LE 7/UE 10 10' 10' w/ MH to  Neck and back - w/ MH to  Neck w/ MH to  Neck post therex and MT w/ MH to  Neck w/ MH to  Neck post therex and MT (no UE) MH to neck and back; LE only 10/ MH to neck, seated bike 10' (sci-fit)   Calf stretch wedge 30" x3 30" x3 30" x3 - - 30" x3    Rows stnding yel TB x10-2 yel TB x11 -2 yel TB x12-2 Red TB x10-2 - Red TB x10-2    shldr ext " yel TB x10-2 yel TB x11-2 yel TB x12-2 Red TB x10-2 - REd TB x10-2    scap clocks - - Post x10 x10 - -     Cervical retraction supine 5sec x10 5 sec x10 5 sec x10 HEP - 5 sec x10 5 sec x10   LTR  x10 HEP HEP x10 --     pelvic tilt Supine h/l x10 x10 HEP HEP - Craniocervical flxn: 3 sec x10 5 sec x10   articulating bridge Supine h/l x10 x10 x10-2 x10-2 - x10    Bridge W/ hip abd resitance - - - - - Grn TB x10    Knee folds (alt) Supine h/l X10; Red TB x10 Red TB x12 Grn TB x10, 12 Grn TB x15 - Grn TB x15    clamshells  '             " Red TB x10 Red TB x12 Grn TB: x10, 12 Grn TB x15 - Grn TB x15    SKC Supine h/l 10" x3 (active w/ pillow cov) 10" x2 w/ towel 10" x3 w/ towel  - passive 10" x2    HS stretch Supine h/l active 30" x2 passive 30" x2 - passive 30" x3 Rt/Lt - passive 30" x2    Cervical Arc seated(no ext) HEP HEP HEP Cervical rot'n w/ wt shift x5 Rt/Lt - -                                                  Patient Education:   Patient was educated on continuing to focus on posture and relaxing shoulders.  Updated HEP for neck given.  Patient did  verbalize and demonstrate an understanding of this education.   Home Exercise Program was updated today.[name]  EVALUATION AND DIAGNOSIS:   Leslie Gross is a 68 y.o. 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    Good challenge w/ all exercise today. No pain w/ craniocervical flexion.  Pt able to turn her head more today-this could be a result of the injections on Friday.  Post tx pt reports overall feeling better and able to turn her head.  Will continue to benefit from skilled PT intervention to improve ROM, flexibility and stability. Pt will benefit from standing hip exercises.  This PT missed progress note.    Due to pt's co-morbidity of myositis--she has pain in bil hips while in supine pos'n.  Pt not able to lift hips off mat for articulating bridge due to back pain and weakness.   Pt will continue to benefit from hip strengthening and scapula stabilization exercises.    Impairments in body function and structure:  Decreased PROM  Decreased AROM  Decreased Strength   Pain  Impaired Balance  Impaired Gait  Postural Dysfunction    Activity Limitations / Restricted Participation:  Headaches at least once/week when the neck gets really tight and sore.  Progressing   Difficulty w/ turning head to drive due to neck pain and stiffness--has to turn her body to drive safely. Improving.   Can't stand 15-20 min on a good day in the kitchen due to back pain and weakness.   Back pain w/ unloading/loading dishwasher, folding laundry, making bed etc.   Not able to get a good night's rest due to neck pain.   Pain w/ ADLs and does activities slowly due to neck and back pain.     PLAN:   Patient requires continued skilled  intervention in order to decrease pain. and meet above mentioned functional goals.  Focus on manual therapy, therex, more focus on lumbar dx next visit. Update HEP routine for back next session.  Do progress note next session.     Therapist Signature:    [name], PT, MPT [phone]  Outpatient Speciality Rehab  Physical Medicine and Rehabilitation  [facility]  P: [phone]     07/21/2019

## 2019-07-22 ENCOUNTER — Ambulatory Visit: Payer: Medicare Other

## 2019-07-26 ENCOUNTER — Ambulatory Visit: Payer: Medicare Other

## 2019-07-26 DIAGNOSIS — M6281 Muscle weakness (generalized): Secondary | ICD-10-CM

## 2019-07-26 DIAGNOSIS — M542 Cervicalgia: Secondary | ICD-10-CM

## 2019-07-26 DIAGNOSIS — G8929 Other chronic pain: Secondary | ICD-10-CM

## 2019-07-26 DIAGNOSIS — G4486 Cervicogenic headache: Secondary | ICD-10-CM

## 2019-07-26 NOTE — Progress Notes (Signed)
[facility]  [address], [address]  [place], [place]  [address]  Phone:  [phone]  Fax:  [phone]    PHYSICAL THERAPY DAILY TREATMENT NOTE    PATIENT: Leslie Gross DOB: 28-Jun-1951   MR #: [id number]  AGE: 68 y.o.    FACILITY PROVIDER #: [id number] PRIMARY MD: Christel Mormon, MD    HICN# Medicare Sub. 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  [name], PT 07/26/2019     Certification Period:  06/16/19 to 09/10/19    Treatment Diagnosis: Neck Pain [M54.2], Painful Cervical ROM [M54.2],Chronic Lower Back Pain without Radiculopathy [M54.5, G89.29, Generalized Muscle Weakness [M62.81] andcervicogenic headache R51    Date of onset: 2018  Precautions: Fall risk, h/o Osteoporosis, polymyositis  Imaging Performed:  No recent images of neck in Epic.  Possible X-ray result at spine specialist.    Goals:  Short Term Goals:  To be met by 8 visits  1.  Pt will be 100% compliant w/ HEP routine to progress w/ ROM, strength and stability. MET 07/27/19  2.  Pt will have at least 5 deg improvement in cervical ROM to allow pt to progress  towards turning head safely while driving. Met 07/27/19  3.  NDI and Oswestry score will have >5 point improvement indicating improved function and decreased pain.    Long Term Goals:  To be met by 24 visitis  4.  Improve cervical sidebend to at least 5 degrees to allow pt to have no > 1-2 hr sleep loss at night due to neck pain.  5.  Pt will have improved hip strength by 1 grade to allow pt to do household chores w/ less back pain.  6.  Improved pt's current functional status by at least 75% .  7.  ABC score will be >80% indicating improved mobility and balance.  8.  NDI and Oswestry will be < 40 % indicating improved function and decreased pain.     Working Toward the Above Goals: (copied from last visit):    Education and Infection Prevention Regarding Covid-19 Pandemic:  Patient was educated on [employer] and Procedure regarding COVID-19 care and safety precautions. Discussed and reviewed patient's medical history and personal risk factors and patient acknowledges exposure risk and wishes to proceed with care.  [id number] Screen: Patient does not present with a temperature >/= 100.0, no new cough, no new throat pain, and no new shortness of breath.   Personal Protective Equipment Utilized:   Gloves, Goggles and Procedure Mask    EXAMINATION:  Subjective Report: Pt reported that she started to take flexeril this sat and Sunday and that has helped her back pain.  She would like  to stop her PT this week because she thinks she can do the exercises at home, continue w/ the muscle relaxants and pain meds as well as modalities at home.    Pain:  Neck pain: 1-2/10 w/ rot'n, no pain at rest.  Low back pain 0/10 today.    Objective Findings today: rot'n: Lt 34 deg, Rt 38 deg; SB: Lt 20 deg, Rt 25 deg    Palpation: +TTP bil occiput, min tightness in bil cervical erector spinae, min tight bil upper cervical erector spinae.    INTERVENTION:  Treatment Performed:  MANUAL THERAPY: occipital release, MFR to occpiut, PROM into  flxn, rot'n and SB, upper cervical rot'n, and CRC to increase rot'n to both sides-- all to increase ROM, decrease pain.  THERAPEUTIC EXERCISES : 1:1 per exercise flow sheet to improve flexibility, ROM, strength, stability, balance, functional mobility.  VCs and TCs provided to perform exercise correctly.         Exercise Specifics Date 07/07/19 Date 07/12/19 Date 07/14/19 Date 07/19/19 07/21/19 07/27/19   Nu step LE 7/UE 10 - w/ MH to  Neck w/ MH to  Neck post therex and MT w/ MH to  Neck w/ MH to  Neck post therex and MT (no UE) MH to neck and back; LE only 10/ MH to neck, seated bike 10' (sci-fit) x10' -nu step   Calf stretch wedge 30" x3 - - 30" x3  Floor: 30" x2; wedge x1   Rows stnding yel TB x12-2 Red TB x10-2 - Red TB x10-2  -   shldr ext " yel TB x12-2 Red TB x10-2 - REd TB x10-2  -   scap clocks - x10 - -   -   Cervical retraction supine 5 sec x10 HEP - 5 sec x10 5 sec x10 5 sec x10   LTR  HEP x10 --      pelvic tilt Supine h/l HEP HEP - Craniocervical flxn: 3 sec x10 5 sec x10 5 sec x10   articulating bridge Supine h/l x10-2 x10-2 - x10  Discontinued.   Bridge W/ hip abd resitance - - - Grn TB x10  Grn TB x10   Knee folds (alt) Supine h/l Grn TB x10, 12 Grn TB x15 - Grn TB x15  Grn TB x15   clamshells  '             " Grn TB: x10, 12 Grn TB x15 - Grn TB x15  Grn TB x15   SKC Supine h/l 10" x3 w/ towel  - passive 10" x2  10"x2 (passive)   HS stretch Supine h/l - passive 30" x3 Rt/Lt - passive 30" x2  30" x2 (passive)   Cervical Arc seated(no ext) HEP Cervical rot'n w/ wt shift x5 Rt/Lt - -                                               Patient Education:   Patient was educated on continuing to focus on posture and relaxing shoulders.    Patient did verbalize and demonstrate an understanding of this education.   Home Exercise Program was reviewed today.[name]    EVALUATION AND DIAGNOSIS:   Leslie Gross is a 68 y.o. 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    Good challenge w/ all exercise today. No pain w/ craniocervical flexion.  Pt able to actively turn head more in supine and seated.  Started d/c plan today.  Deferred progress note today as pt will be discharge next visit. Detailed assessments to be done at that time.    Due to pt's co-morbidity of myositis--she has pain in bil hips while in supine pos'n.  Pt not able to lift hips off mat for articulating bridge due to back pain and weakness.   Pt will continue to benefit from hip strengthening and scapula stabilization exercises.    Impairments in body function and structure:  Decreased PROM  Decreased AROM  Decreased Strength   Pain  Impaired Balance  Impaired Gait  Postural Dysfunction    Activity Limitations / Restricted Participation:  Headaches at least once/week when the neck gets really tight and sore.  Not as much as before 07/27/19   Difficulty w/ turning head to drive due to neck pain and stiffness--has to turn her body to drive safely. Improving but much more easier now to turn head to look over shoulder, not jerking the head any more while turning 07/27/19..   Can't stand 15-20 min on a good day in the kitchen due to back pain and weakness.  No change   Back pain w/ unloading/loading dishwasher, folding laundry, making bed etc. Mostly resolved 07/27/19   Not able to get a good night's rest due to neck pain. 90% improved 07/27/19   Pain w/ ADLs and does activities slowly due to neck and back pain. Resolved 07/27/19     PLAN:   Patient requires continued skilled intervention in order to decrease pain. and meet above mentioned functional goals.  Focus on manual therapy, therex, more focus on lumbar dx next visit.  Update HEP routine for back next session.  Discharge after next session.    Therapist Signature:    [name], PT, MPT [phone]  Outpatient Speciality Rehab  Physical Medicine and Rehabilitation  [facility]  P: [phone]     07/26/2019

## 2019-07-27 ENCOUNTER — Ambulatory Visit: Payer: Medicare Other

## 2019-07-28 ENCOUNTER — Ambulatory Visit: Payer: Medicare Other

## 2019-07-28 DIAGNOSIS — M6281 Muscle weakness (generalized): Secondary | ICD-10-CM

## 2019-07-28 DIAGNOSIS — G4486 Cervicogenic headache: Secondary | ICD-10-CM

## 2019-07-28 DIAGNOSIS — M542 Cervicalgia: Secondary | ICD-10-CM

## 2019-07-28 DIAGNOSIS — M545 Low back pain, unspecified: Secondary | ICD-10-CM

## 2019-07-28 NOTE — Discharge Summary (Signed)
[facility]  [address], [address]  [place], [place]  [address]  Phone:  [phone]  Fax:  [phone]    PHYSICAL THERAPY DAILY TREATMENT AND DISCHARGE NOTE    PATIENT: Leslie Gross DOB: 1951-02-21   MR #: [id number]  AGE: 68 y.o.    FACILITY PROVIDER #: [id number] PRIMARY MD: Christel Mormon, MD    HICN# Medicare Sub. 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  [name], PT 07/28/2019     Certification Period:  06/16/19 to 09/10/19    Treatment Diagnosis: Neck Pain [M54.2], Painful Cervical ROM [M54.2],Chronic Lower Back Pain without Radiculopathy [M54.5, G89.29, Generalized Muscle Weakness [M62.81] andcervicogenic headache R51    Date of onset: 2018  Precautions: Fall risk, h/o Osteoporosis, polymyositis  Imaging Performed:  No recent images of neck in Epic.  Possible X-ray result at spine specialist.    Goals:  Short Term Goals:  To be met by 8 visits  1.  Pt will be 100% compliant w/ HEP routine to progress w/ ROM, strength and stability. MET 07/27/19  2.  Pt will have at least 5 deg improvement in cervical ROM to  allow pt to progress towards turning head safely while driving. Met 07/27/19  3.  NDI and Oswestry score will have >5 point improvement indicating improved function and decreased pain. MET    Long Term Goals:  To be met by 24 visitis  4.  Improve cervical sidebend to at least 5 degrees to allow pt to have no > 1-2 hr sleep loss at night due to neck pain. MET 07/28/19  5.  Pt will have improved hip strength by 1 grade to allow pt to do household chores w/ less back pain. MET w/ flxn and ext--missed assessing hip abd 07/28/19  6.  Improved pt's current functional status by at least 75% . MET 07/28/19  7.  ABC score will be >80% indicating improved mobility and balance. (missed assessing)  8.  NDI and Oswestry will be < 40 % indicating improved function and decreased pain.  MET 07/28/19    Working Toward the Above Goals: (copied from last visit):    Education and Infection Prevention Regarding Covid-19 Pandemic:  Patient was educated on [employer] and Procedure regarding COVID-19 care and safety precautions. Discussed and reviewed patient's medical history and personal risk factors and patient acknowledges exposure risk and wishes to proceed with care.  IONGE95 Screen: Patient does not present with a temperature >/= 100.0, no new cough, no new throat pain, and no new shortness of breath.   Personal Protective Equipment Utilized:   Gloves, Goggles and Procedure Mask    EXAMINATION:  Subjective Report: Pt reported that  her neck feels the best in month since last week.  She's been working on her range of motion and has been taking her meds timely.    Level of Function at eval / current functional status   Headaches at least once/week when the neck gets really tight and sore.  Not as much as before 07/27/19   Difficulty w/ turning head to drive due to neck pain and stiffness--has to turn her body to drive safely. Improving but much more easier now to turn head to look over shoulder, not jerking the head any more while  turning 07/27/19..   Can't stand 15-20 min on a good day in the kitchen due to back pain and weakness.  No change   Back pain w/ unloading/loading dishwasher, folding laundry, making bed etc. Mostly resolved 07/27/19   Not able to get a good night's rest due to neck pain. 90% improved 07/27/19   Pain w/ ADLs and does activities slowly due to neck and back pain. Resolved 07/27/19     Pain at evaluation:  6-7/10 avg pain for neck (w/ movement), no pain at rest at neck;  3/10 low back pain at worst; 5-6/10 w/ standing activities.  Pain 05/28/19:  Neck pain: 1-2/10 w/ rot'n, no pain at rest.  Low back pain 0/10 today.    Objective Findings:    INITIAL EVAL   AROM  INITIAL EVAL   PROM INITIAL EVALUATION STRENGTH   Cervical rot'n: Lt 14 deg, Rt 18 deg ( w/ pain at end range 5/10 on both sides) Cervical Rot'n: Lt 20 deg and Rt 25 deg w/ pain at end range. Isometric rot'n: poor w/ pain   Cervical SB: Lt 14 deg, Rt 15 deg w/ 5/10 pain at end range. Moderate restriction in SB. Isometric SB fair bil   Cervical flxn: 24 deg w/ 5/10 pain at end range. Moderate restriction in flxn w/ pain at end range.. Isometric flxn: poor   Cervical ext: 25 deg 4/10 pain at end range, shaky and jerky movements Mod restriction in ext w/ pain. Isometric ext: good   Lumbar flxn: mid shin w/ pain upper thoracic area 3/10 NT Hip flxn: 3/5 bil   Lumbar SB: Lt 2" above knee joint line, Rt 3" above knee joint line. NT Hip abd: 2/5 bil   Lumbar ext: no extension movement. NT Hip ext: not able to lift hips off mat w/ bridging (unable to assume prone pos'n)   Lumbar rot'n: 25% decreased w/ back pain 4/10 NT Bil Quads: 4-/5     07/28/19   AROM  07/28/19 PROM 07/28/19 STRENGTH   Cervical rot'n: Lt 43 deg, Rt 48 deg w/ stretching at end range Supine: Cervical Rot'n: Lt 60 deg and Rt 55 deg w/ pulling at end range. Isometric rot'n: good   Cervical SB: Lt 25 deg, Rt 21 deg w/ 2/10 pain at end range. Min restriction in SB at end range. Isometric SB good bil    Cervical flxn: 50 deg  Min restriction in flxn w/ stretching at end range.. Isometric flxn: good   Cervical ext: 48 deg w/ smooth movements NT Isometric ext: good   Lumbar flxn: lower shin  NT Hip flxn: 4/5 bil   Lumbar SB: Lt at knee joint line, Rt 1" below knee joint line. NT Hip abd: missed assessing   Lumbar ext: no extension movement. NT Hip ext: Able to bridge hips off mat >4"   Lumbar rot'n: full bil w/ no pain NT Bil  Quads: 4+/5     INITIAL EVAL  07/28/19   Functional Mobility and transfers:    Patient is independent with household and community mobility. n/a   Balance:   SLS R LE 2 sec  SLS L LE 1 sec SLS R LE 1 sec  SLS L LE 2 sec   Gait: Compensated Trendelenberg gait w/ increased lateral trunk sway; No terminal knee extension noted bil; No pelvic rotation noted; w/o assistive device. Gait: Compensated Trendelenberg gait w/ increased lateral trunk sway; No terminal knee extension noted bil; No pelvic rotation noted; w/o assistive device. (gait not addressed in PT sessions)   HS flexibility: 90/90 HS: not assessed 90/90 HS: Lt (-) 30 deg, Rt (-) 34 deg     Palpation at eval: +TTP bil occiput, bil UTs; tightness in bil cervical erector spinae, bil UTs.  Joint Mobility at eval: severe restriction cervical p/a mobility in supine.  Palpation current: min +TTP bil occiput, min tightness in bil cervical erector spinae, min tight bil upper cervical erector spinae and UTs.  Joint mobility current: mod restriction in p/a mobility of cervical spine.    Outcomes:   INITIAL EVAL  07/28/19   NDI:60%    NDI: 22%   Oswestry: Oswestry 52% Oswestry: 38%   ABC: 67.5% NT       INTERVENTION:  Treatment Performed:  THERAPEUTIC EXERCISES : 1:1 per exercise flow sheet to improve flexibility, ROM, strength, stability, balance, functional mobility.  VCs and TCs provided to perform exercise correctly.   THERAPEUTIC ACTIVITY: measurement completed for neck and back; completed NDI Oswestry and reviewed answer w/ pt.  Pt education  on update HEP routine.        Exercise Specifics Date 07/07/19 Date 07/12/19 Date 07/14/19 Date 07/19/19 07/21/19 07/26/19 07/28/19   Nu step LE 7/UE 10 - w/ MH to  Neck w/ MH to  Neck post therex and MT w/ MH to  Neck w/ MH to  Neck post therex and MT (no UE) MH to neck and back; LE only 10/ MH to neck, seated bike 10' (sci-fit) x10' -nu step  x10' nu step w/ MH to neck and back.   Calf stretch wedge 30" x3 - - 30" x3  Floor: 30" x2; wedge x1 30" x3   Rows stnding yel TB x12-2 Red TB x10-2 - Red TB x10-2  - Red TB x 10   shldr ext " yel TB x12-2 Red TB x10-2 - REd TB x10-2  - Red TB x 10   scap clocks - x10 - -   - -   Cervical retraction supine 5 sec x10 HEP - 5 sec x10 5 sec x10 5 sec x10 5 sec x10   LTR  HEP x10 --    -   pelvic tilt Supine h/l HEP HEP - Craniocervical flxn: 3 sec x10 5 sec x10 5 sec x10 x10   articulating bridge Supine h/l x10-2 x10-2 - x10  Discontinued. -   Bridge W/ hip abd resitance - - - Grn TB x10  Grn TB x10 Grn TB x10   Knee folds (alt) Supine h/l Grn TB x10, 12 Grn TB x15 - Grn TB x15  Grn TB x15 Grn TB x15   clamshells  '             " Grn TB: x10, 12 Grn TB x15 - Grn TB x15  Grn TB x15 Grn TB x15   SKC Supine h/l 10" x3 w/ towel  -  passive 10" x2  10"x2 (passive) 10" x2   HS stretch Supine h/l - passive 30" x3 Rt/Lt - passive 30" x2  30" x2 (passive) 30" x1   Cervical Arc seated(no ext) HEP Cervical rot'n w/ wt shift x5 Rt/Lt - -        Patient Education:   Patient was educated on continuing to focus on posture and relaxing shoulders, and neck.  Updated HEP for hips.    Patient did verbalize and demonstrate an understanding of this education.   Home Exercise Program was updated today.[name]    EVALUATION AND DIAGNOSIS:   Leslie Gross is a 68 y.o. 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    Good challenge w/ all exercise today. No pain post any exercise.  Due to pt's co-morbidity of myositis--she has pain in bil hips while in supine pos'n.  Pt has met all STGs and most LTGs.  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  Pt is being discharged from PT per pt's request.  She should not have any difficulties at home as she has made significant improvements w/ her neck ROM and decrease in neck pain.  Pt understands to follow up w/ MP if her neck pain increases, loss in ROM etc.    Impairments in body function and structure:  Decreased PROM  Decreased AROM  Decreased Strength   Pain  Impaired Balance  Impaired Gait  Postural Dysfunction    PLAN:   Discharge from physical therapy.     Therapist Signature:    [name], PT, MPT [phone]  Outpatient [facility] Re[facility]  Physical Medicine and Rehabilitation  [facility]  P: [phone]     07/28/2019

## 2019-07-29 ENCOUNTER — Ambulatory Visit: Payer: Medicare Other

## 2019-08-02 ENCOUNTER — Ambulatory Visit: Payer: Medicare Other

## 2019-08-03 ENCOUNTER — Ambulatory Visit: Payer: Medicare Other

## 2019-08-04 ENCOUNTER — Ambulatory Visit: Payer: Medicare Other

## 2019-08-05 ENCOUNTER — Ambulatory Visit: Payer: Medicare Other

## 2019-08-09 ENCOUNTER — Ambulatory Visit: Payer: Medicare Other

## 2019-08-11 ENCOUNTER — Ambulatory Visit: Payer: Medicare Other

## 2019-08-16 ENCOUNTER — Ambulatory Visit: Payer: Medicare Other

## 2019-08-18 ENCOUNTER — Ambulatory Visit: Payer: Medicare Other

## 2019-08-23 ENCOUNTER — Ambulatory Visit: Payer: Medicare Other

## 2019-08-25 ENCOUNTER — Ambulatory Visit: Payer: Medicare Other

## 2019-08-30 ENCOUNTER — Ambulatory Visit: Payer: Medicare Other

## 2019-09-01 ENCOUNTER — Ambulatory Visit: Payer: Medicare Other

## 2019-09-01 ENCOUNTER — Ambulatory Visit
Admission: RE | Admit: 2019-09-01 | Discharge: 2019-09-01 | Disposition: A | Discharge status: Home / Self Care | Payer: Medicare Other | Source: Ambulatory Visit | Attending: Gastroenterology | Admitting: Gastroenterology

## 2019-09-01 DIAGNOSIS — R109 Unspecified abdominal pain: Secondary | ICD-10-CM | POA: Insufficient documentation

## 2019-09-01 LAB — HEPATIC FUNCTION PANEL
ALT: 26 U/L (ref 0–55)
AST (SGOT): 22 U/L (ref 5–34)
Albumin/Globulin Ratio: 1.8 (ref 0.9–2.2)
Albumin: 3.8 g/dL (ref 3.5–5.0)
Alkaline Phosphatase: 89 U/L (ref 37–106)
Bilirubin Direct: 0.1 mg/dL (ref 0.0–0.5)
Bilirubin Indirect: 0.2 mg/dL (ref 0.2–1.0)
Bilirubin, Total: 0.3 mg/dL (ref 0.2–1.2)
Globulin: 2.1 g/dL (ref 2.0–3.6)
Protein, Total: 5.9 g/dL — ABNORMAL LOW (ref 6.0–8.3)

## 2019-09-01 LAB — LIPASE: Lipase: <4 U/L — ABNORMAL LOW (ref 8–78)

## 2019-09-01 LAB — AMYLASE: Amylase: 17 U/L — ABNORMAL LOW (ref 25–125)

## 2019-09-15 ENCOUNTER — Ambulatory Visit
Admission: RE | Admit: 2019-09-15 | Discharge: 2019-09-15 | Disposition: A | Discharge status: Home / Self Care | Payer: Medicare Other | Source: Ambulatory Visit | Attending: Rheumatology | Admitting: Rheumatology

## 2019-09-15 DIAGNOSIS — E559 Vitamin D deficiency, unspecified: Secondary | ICD-10-CM | POA: Insufficient documentation

## 2019-09-15 DIAGNOSIS — R7309 Other abnormal glucose: Secondary | ICD-10-CM | POA: Insufficient documentation

## 2019-09-15 DIAGNOSIS — R809 Proteinuria, unspecified: Secondary | ICD-10-CM | POA: Insufficient documentation

## 2019-09-15 LAB — URINALYSIS WITH MICROSCOPIC
Bilirubin, UA: NEGATIVE
Blood, UA: NEGATIVE
Glucose, UA: NEGATIVE
Ketones UA: 5 — AB
Leukocyte Esterase, UA: NEGATIVE
Nitrite, UA: NEGATIVE
Protein, UR: NEGATIVE
Specific Gravity UA: 1.026 (ref 1.001–1.035)
Urine pH: 5 (ref 5.0–8.0)
Urobilinogen, UA: NORMAL mg/dL (ref 0.2–2.0)

## 2019-09-15 LAB — PROTEIN / CREATININE RATIO, URINE
Urine Creatinine, Random: 85.1 mg/dL
Urine Protein Random: 10.9 mg/dL (ref 1.0–14.0)
Urine Protein/Creatinine Ratio: 0.1

## 2019-09-15 LAB — HEMOGLOBIN A1C
Average Estimated Glucose: 93.9 mg/dL
Hemoglobin A1C: 4.9 % (ref 4.6–5.9)

## 2019-09-15 LAB — VITAMIN D,25 OH,TOTAL: Vitamin D, 25 OH, Total: 23 ng/mL — ABNORMAL LOW (ref 30–100)

## 2019-09-15 LAB — HEMOLYSIS INDEX: Hemolysis Index: 9 (ref 0–18)

## 2020-02-22 ENCOUNTER — Other Ambulatory Visit (INDEPENDENT_AMBULATORY_CARE_PROVIDER_SITE_OTHER): Payer: Self-pay

## 2020-02-22 DIAGNOSIS — I420 Dilated cardiomyopathy: Secondary | ICD-10-CM

## 2020-02-22 MED ORDER — CARVEDILOL PHOSPHATE ER 10 MG PO CP24
10.00 mg | ORAL_CAPSULE | Freq: Every morning | ORAL | 0 refills | Status: DC
Start: 2020-02-22 — End: 2020-06-05

## 2020-03-14 ENCOUNTER — Other Ambulatory Visit (INDEPENDENT_AMBULATORY_CARE_PROVIDER_SITE_OTHER): Payer: Self-pay

## 2020-03-14 ENCOUNTER — Ambulatory Visit (FREE_STANDING_LABORATORY_FACILITY): Payer: Medicare Other

## 2020-03-14 DIAGNOSIS — Z01818 Encounter for other preprocedural examination: Secondary | ICD-10-CM

## 2020-03-14 DIAGNOSIS — I10 Essential (primary) hypertension: Secondary | ICD-10-CM

## 2020-03-14 DIAGNOSIS — Z20822 Contact with and (suspected) exposure to covid-19: Secondary | ICD-10-CM

## 2020-03-14 DIAGNOSIS — I420 Dilated cardiomyopathy: Secondary | ICD-10-CM

## 2020-03-14 MED ORDER — LOSARTAN POTASSIUM 25 MG PO TABS
25.0000 mg | ORAL_TABLET | Freq: Every day | ORAL | 3 refills | Status: DC
Start: 2020-03-14 — End: 2021-03-08

## 2020-03-14 NOTE — Telephone Encounter (Signed)
Losartan e-rx'ed to local [facility]

## 2020-03-15 LAB — COVID-19 (SARS-COV-2): SARS CoV 2 Overall Result: NOT DETECTED

## 2020-03-16 ENCOUNTER — Telehealth (INDEPENDENT_AMBULATORY_CARE_PROVIDER_SITE_OTHER): Payer: Self-pay

## 2020-03-16 NOTE — Telephone Encounter (Signed)
Ellie from [facility] called requesting this year's OV note and EKG in advance of a surgical procedure on Monday. Advised that Ms Degraff's last OV with [facility] was in March 2020 and she will need to call [facility] to schedule a pre-operative appointment.

## 2020-04-14 IMAGING — CT CT L SPINE W/O CM
3 series · 11 of 33 positions shown, 13 images · IV contrast (APPLIED)
Comparison: Thoracolumbar MRI 08/18/2018.

CLINICAL DATA: Right flank pain radiating anteriorly for 1 week.
History of back surgery 2-3 weeks ago.

EXAM:
CT THORACIC AND LUMBAR SPINE WITHOUT CONTRAST
TECHNIQUE: Multidetector CT imaging of the thoracic spine was performed without
contrast. Multiplanar CT image reconstructions were also generated.
Multiplanar reformatted images of the lumbar spine were
reconstructed from the abdominopelvic CT performed concurrently.

[Series 8: l-spine axials st · axial · 0.27mm/px · z∈[-494,-334]mm · 3 of 131 slices shown, 4 images]
[im 31/131  soft-tissue]
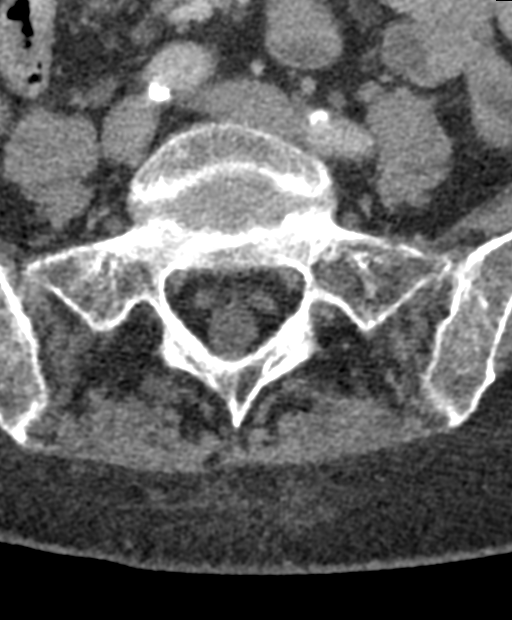
[im 31/131  bone]
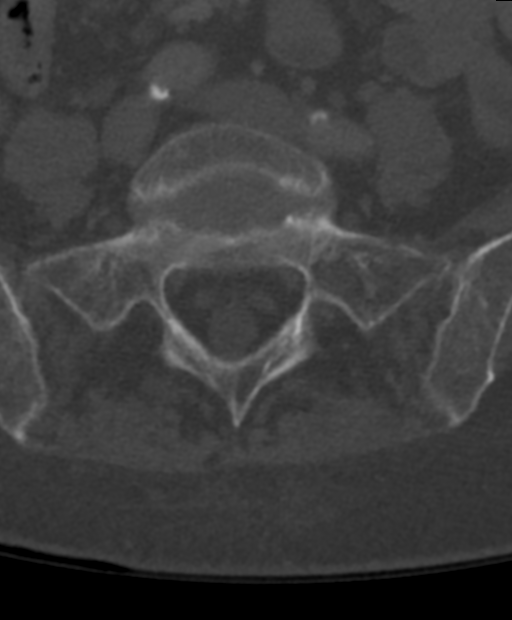
[im 71/131  bone]
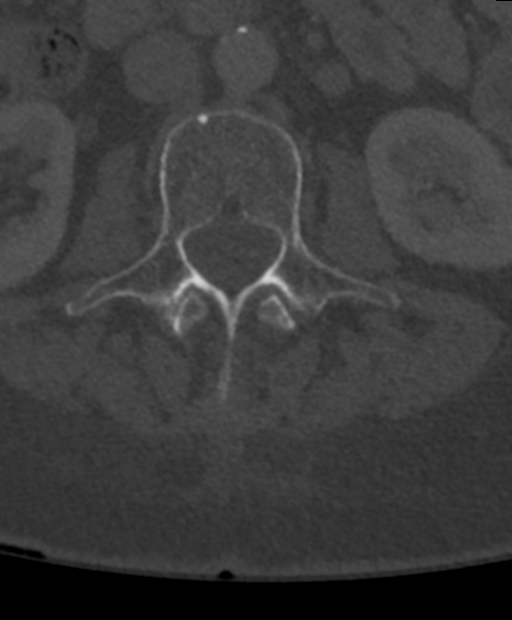
[im 111/131  bone]
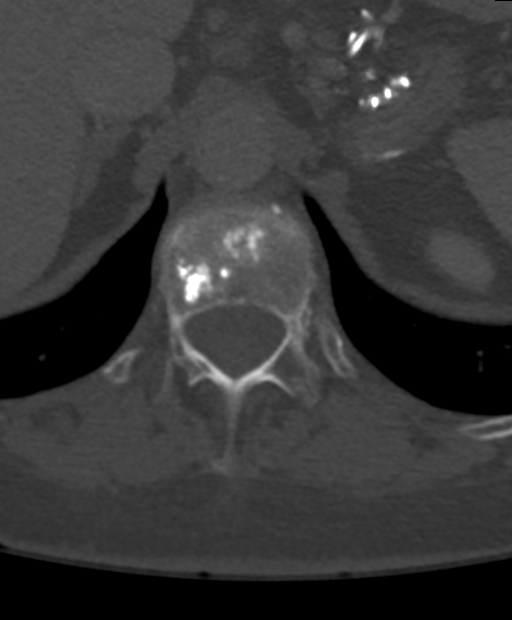

[Series 10: l-spine cor · coronal · 0.33mm/px · 3 of 70 slices shown]
[im 14/70  bone]
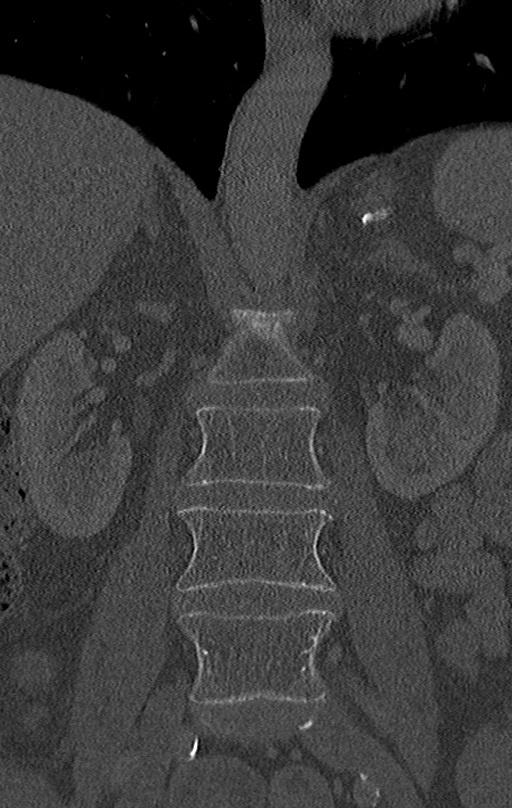
[im 28/70  bone]
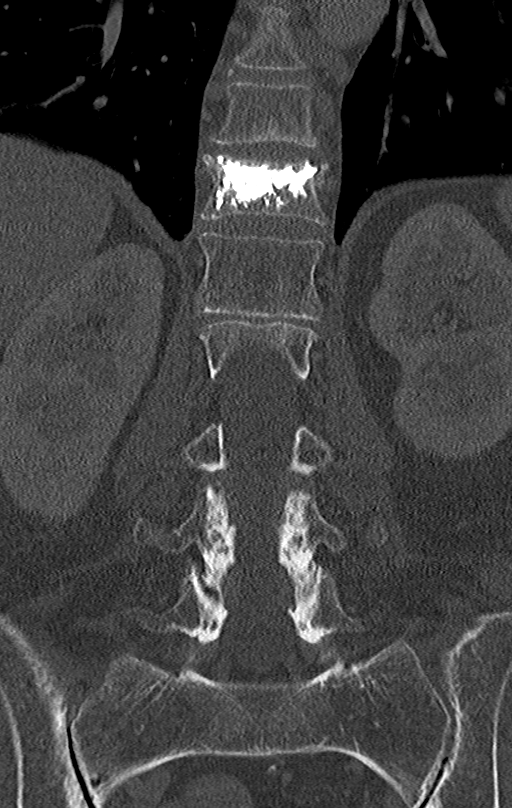
[im 42/70  bone]
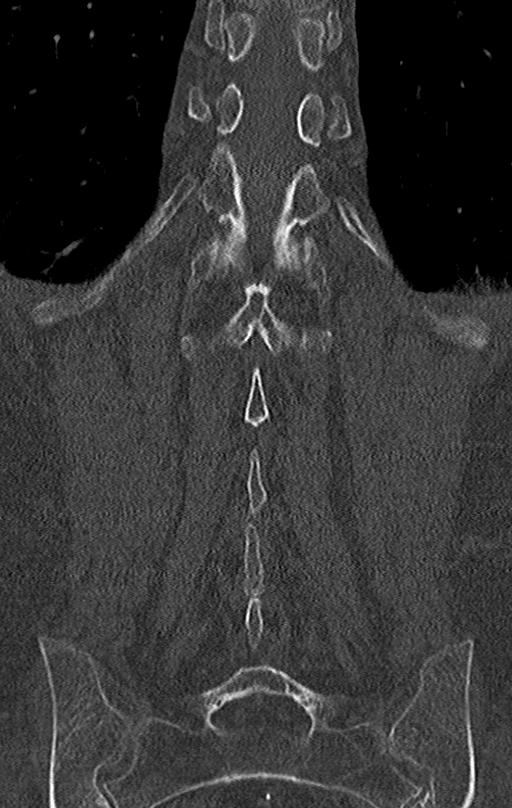

[Series 11: l-spine sag · sagittal · 0.34mm/px · 5 of 75 slices shown, 6 images]
[im 25/75  bone]
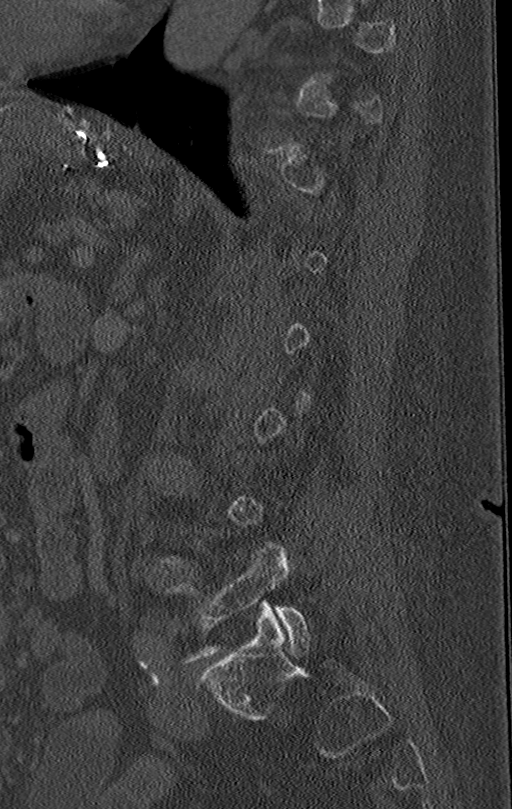
[im 31/75  bone]
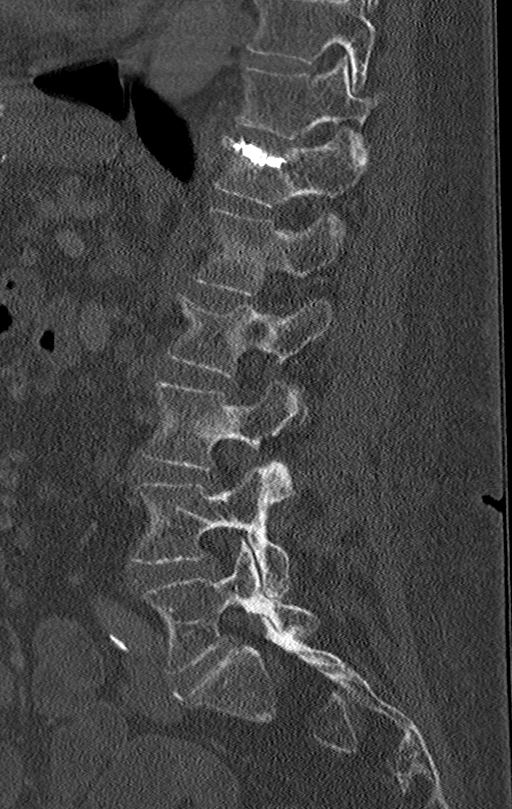
[im 38/75  soft-tissue]
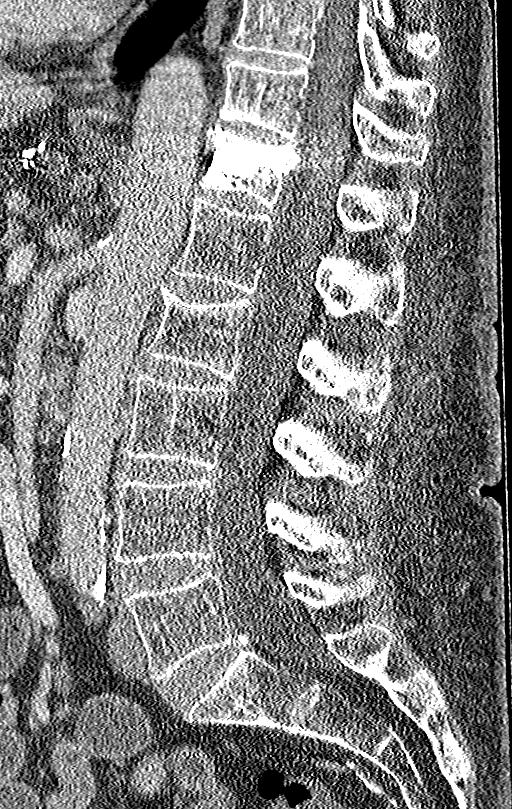
[im 38/75  bone]
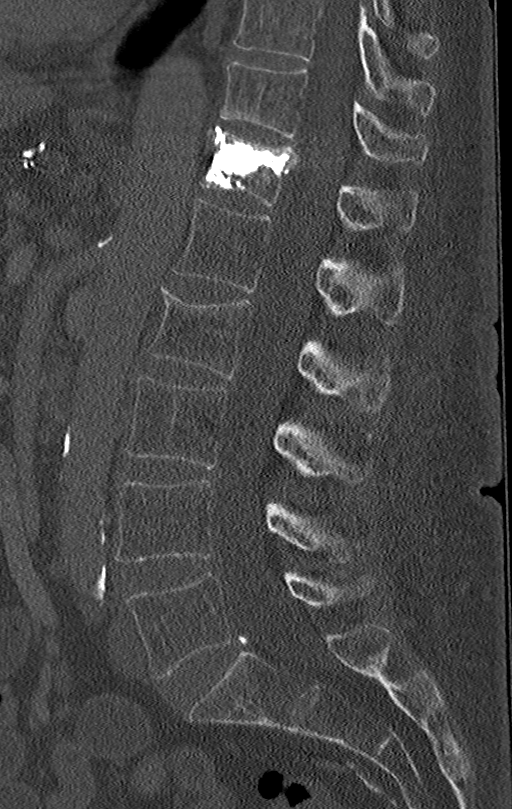
[im 44/75  bone]
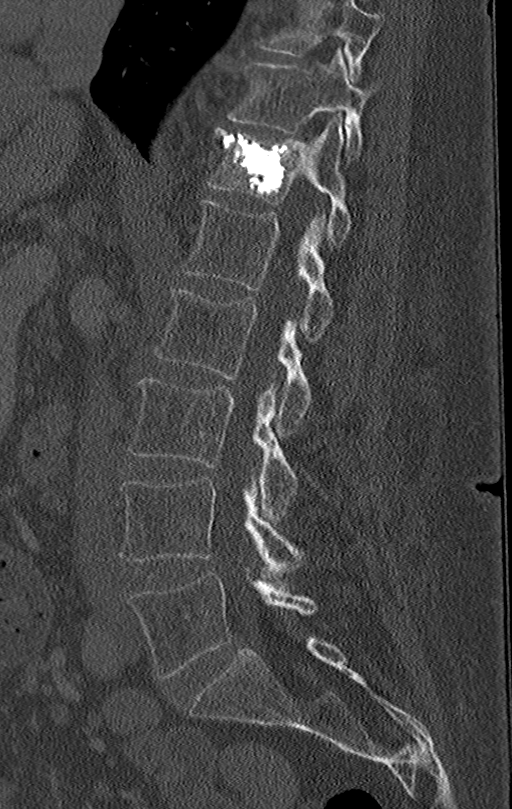
[im 50/75  bone]
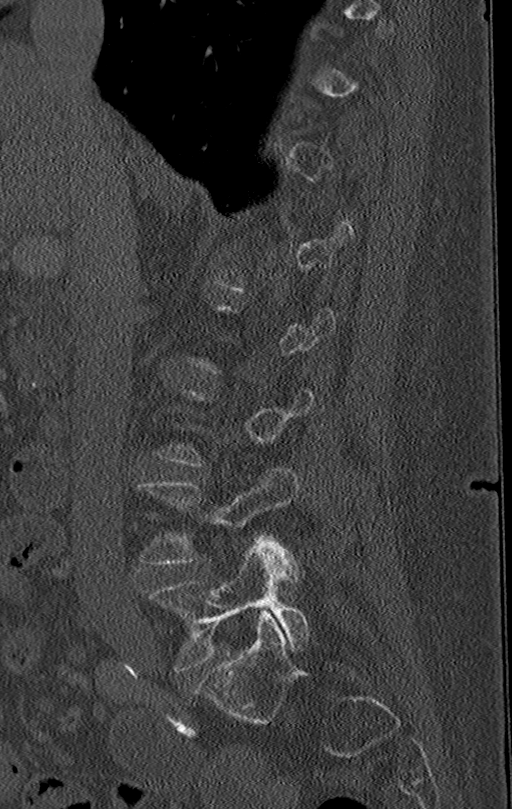

[11 of 33 positions shown; findings below may reference images not displayed]

FINDINGS: CT THORACIC SPINE FINDINGS

Alignment: Normal.

Vertebrae: Interval spinal augmentation at T12 for a compression
fracture demonstrated on previous MRI. The methylmethacrylate is
contained within the vertebral body. There is stable mild osseous
retropulsion at T12. The bones are demineralized. No evidence of
acute spinal fracture or focal marrow lesion. There is an acute
mildly displaced fracture of the right 11th rib posteriorly (axial
images 120 and 121/3).

Paraspinal and other soft tissues: No significant paraspinal
findings. Trace right pleural effusion. No pneumothorax.

Disc levels: There are stable small disc protrusions at T6-7 and
T8-9. No large disc herniation or foraminal compromise demonstrated.

CT LUMBAR SPINE FINDINGS

Segmentation: Normal.

Alignment: Normal.

Vertebrae: The bones are demineralized. There is a stable chronic
superior endplate compression deformity at L2 which is asymmetric to
the left. No evidence of acute lumbar spine fracture or traumatic
listhesis.

Paraspinal and other soft tissues: No acute paraspinal findings.
There is aortic and branch vessel atherosclerosis. There are
postsurgical changes in the upper abdomen consistent with gastric
bypass.

Disc levels: No disc herniation, spinal stenosis or nerve root
encroachment. Mild facet hypertrophy bilaterally. Degenerative
changes of the sacroiliac joints bilaterally.
IMPRESSION: CT THORACIC SPINE IMPRESSION

1. Mildly displaced acute fracture of the right 11th rib
posteriorly. This likely contributes to the patient's symptoms.
2. No evidence of acute thoracic spine fracture. Interval spinal
augmentation at T12 without demonstrated complication.
3. Stable small thoracic disc protrusions.

CT LUMBAR SPINE IMPRESSION

1. No acute lumbar spine findings.
2. No lumbar spine disc herniation or significant spinal stenosis.

## 2020-05-01 ENCOUNTER — Ambulatory Visit: Payer: Self-pay

## 2020-05-01 ENCOUNTER — Encounter (FREE_STANDING_LABORATORY_FACILITY): Payer: Medicare Other

## 2020-05-01 DIAGNOSIS — K5190 Ulcerative colitis, unspecified, without complications: Secondary | ICD-10-CM

## 2020-05-01 DIAGNOSIS — K219 Gastro-esophageal reflux disease without esophagitis: Secondary | ICD-10-CM

## 2020-05-01 LAB — SURGICAL PATHOLOGY EXAM

## 2020-05-03 LAB — LAB USE ONLY - HISTORICAL SURGICAL PATHOLOGY

## 2020-06-05 MED ORDER — CARVEDILOL PHOSPHATE ER 10 MG PO CP24
10.00 mg | ORAL_CAPSULE | Freq: Every morning | ORAL | 3 refills | Status: DC
Start: 2020-06-05 — End: 2020-06-06

## 2020-06-05 NOTE — Addendum Note (Signed)
Addended by: Philbert Riser E on: 06/05/2020 03:07 PM     Modules accepted: Orders

## 2020-06-06 ENCOUNTER — Other Ambulatory Visit (INDEPENDENT_AMBULATORY_CARE_PROVIDER_SITE_OTHER): Payer: Self-pay

## 2020-06-06 ENCOUNTER — Ambulatory Visit: Payer: Medicare Other | Attending: Rheumatology

## 2020-06-06 DIAGNOSIS — I420 Dilated cardiomyopathy: Secondary | ICD-10-CM

## 2020-06-06 DIAGNOSIS — M542 Cervicalgia: Secondary | ICD-10-CM | POA: Insufficient documentation

## 2020-06-06 DIAGNOSIS — R29898 Other symptoms and signs involving the musculoskeletal system: Secondary | ICD-10-CM

## 2020-06-06 MED ORDER — CARVEDILOL PHOSPHATE ER 10 MG PO CP24
10.0000 mg | ORAL_CAPSULE | Freq: Every morning | ORAL | 0 refills | Status: DC
Start: 2020-06-06 — End: 2021-06-14

## 2020-06-06 NOTE — Telephone Encounter (Signed)
Called pt as she requested refill of coreg but hasn't been seen since 12/2018. Spoke with pt and she is aware overdue . Spoke with [facility] and they will call her back to schedule appt in [facility] with Dr. Gaylyn Lambert.

## 2020-06-06 NOTE — PT Eval Note (Signed)
[facility]  [address], [address]  [place], [place]  [address]  Phone:  [phone]  Fax:  [phone]    PHYSICAL THERAPY EVALUATION AND PLAN OF CARE           Referred By: Janene Harvey, MD    *I agree to the plan of care stated below*                                                                                                                                           Physician Signature      Date      PATIENT: Leslie Gross DOB: Jun 18, 1951   MR #: [id number]  AGE: 69 y.o.    FACILITY PROVIDER #: [id number] PRIMARY MD: Christel Mormon, MD    HICN# Medicare Sub. Num: [id number] DIAGNOSES: Cervicalgia [M54.2]        Date of Service PT Received On: 06/06/20   Treatment Time Start Time: 1400 to Stop Time: 1450   Time Calculation Time Calculation (min): 50 min   Visit # PT Visit  PT Visit Number: 1   Units Billed PT Evaluation  $ PT Evaluation Moderate Complexity (16606): 1 Procedure       Certification Period:  06/06/2020 to 09/06/2020    Treatment Diagnosis:  Neck Pain [M54.2], Painful Cervical ROM [M54.2] and Decreased Range of Motion in neck [M29.898]    Date of onset: 3 years    Imaging Performed:  No:     Precautions:   Fall Risk  Arthritis, Breathing Difficulty, Dizziness, Osteoporosis, vision problems    Medications listed in EMR:  has a current medication list which includes the following prescription(s): acetaminophen, amitriptyline, aspirin ec, butalbital-acetaminophen-caffeine, calcium citrate-vitamin d, carvedilol, cyclobenzaprine, cyclosporine, diazepam, escitalopram, fluticasone, hydrocodone-acetaminophen, latanoprostene bunod, levothyroxine, lidocaine, losartan, mesalamine, multivitamin, multiple vitamins-minerals, naloxone, omeprazole, prednisone, sulfasalazine, vitamin d (ergocalciferol), and vitamin e.  Patient/Caregiver does not report changes to medication at this time.    Allergies:  Allergies   Allergen Reactions     Shellfish-Derived Products Angioedema, Diarrhea and Nausea And Vomiting    Fluconazole Diarrhea and Nausea And Vomiting    Penicillins Nausea And Vomiting and Abdominal Pain    Tape Other (See Comments)     Causes blisters       Education and Infection Prevention Regarding Covid-19 Pandemic:  Patient was educated on [facility] Policy and Procedure regarding COVID-19 care and safety precautions. Discussed and reviewed patient's medical history and personal risk factors and patient acknowledges exposure risk and wishes to proceed with care.    TKZSW10 Screen: Patient does not present with a temperature >/= 100.0, no new cough, no new throat pain, and no new shortness of breath.     Personal Protective Equipment Utilized:  Procedure Mask    EXAMINATION / EVALUATION:    Past Medical History:  Chilton Si  has a past medical history of Anemia, Arrhythmia (Dg 2008), Asthma, Blood transfusion without reported diagnosis (2012), Cardiomegaly, Depression, Difficulty in walking(719.7), Echocardiogram (04/2007, 06/2018), Essential (primary) hypertension (08/14/2017), Fracture (SEPT MID 2019), Fracture (06/2017), GERD (gastroesophageal reflux disease), Glaucoma (DX 2012), Headache, Holter monitor (03/2017), Hypothyroidism, Left rib fracture, Mitral valve prolapse, Mitral valve regurgitation, MPI - Lexiscan (03/2017), Murmur, Osteoporosis, Pancreatitis (2012), Polymyalgia rheumatica, Polymyositis, Pulmonary embolism, Shoulder pain (DX JULY 2019), Sleep apnea, and Ulcerative colitis, chronic (DX 1990).    Past Surgical History:  Leslie Gross  has a past surgical history that includes Dilation and curettage of uterus (2011); Cystostomy w/ bladder biopsy (08/2010); Esophagogastroduodenoscopy (2012 MARCH); Lung biopsy (Right, 1998); Ankle Fusion (Left, 1981); Knee surgery (Left, 1981); Cesarean section (1984, 1979); Shoulder muscle transfer (Right, 1972); Tonsillectomy (1956); Gastric bypass (2006); EGD, DILATION  (N/A, 12/06/2016); Colonoscopy (N/A, 12/06/2016); Breast surgery (0981-1914); Joint replacement (2012); Fracture surgery; Cholecystectomy (2013); EGD, DILATION (BTN 2011 TRU 2018); D&C DIAGNOSTIC (2011); Cardiac catheterization (2009); Cosmetic surgery (1983); Rotator cuff repair (Right, 2014); EGD, COLONOSCOPY (N/A, 07/30/2018); Rotator cuff repair (Right); Pancreas surgery; and Reduction mammaplasty.        Social History:  [name] reports good social support system at home..  Malai reports adequate housing and no significant barriers to household mobility.  DME Owned:  None reported.    Patient presents for physical therapy today alone.     HISTORY:     Leslie Gross is a 69 y.o. female presenting with neck pain that started 3 years ago and has been getting progressively worse. 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 Pt was treated for her neck pain 6 months ago and has not gotten a lot worse. Pt is unable to sleep through the night and wakes 5-6x secondary to neck pain.  Pt had a fall 1-2 months ago walking off a curb. Pt reports that her neck pain makes it difficult to walk and is making her balance worse.   Pt reports that the neck pain is bad all day. Pt takes muscle relaxer's/heat to help with the pain.  Sleep/sitting in one position/reading with head flexed makes the pain worse.      Patient Goal: Pt's goal with physical therapy is to have more neck comfort and better movement in her neck    Subjective Report: Today, pt's neck was very painful in the morning but it gets more mild throughout the day.     Pain:    4 on a scale of 0 - 10, location: neck    Level of function:    Level of function at eval     Required assistance with ADL's: secondary to neck pain and strain  Required asssitance with IADL's: secondary to neck strain and pain  Unable to perform light household duties daily: secondary to neck strain  Restricts activity  participation: difficulty driving/unable to look over shoulders        REVIEW OF SYSTEMS:  Communication:  Alert and oriented to person, place and time.  Emotional/Behavioral responses appear appropriate at this time.  Demonstrates ability to make needs known.    Cardiopulmonary:  Seated Vital Signs:  right upper extremity:  Blood Pressure:  138/76  Pulse: 78    Integumentary:  Unremarkable.       Musculoskeletal System:           Musculoskeletal System:     INITIAL EVAL   AROM  INITIAL EVAL  PROM INITIAL EVALUATION STRENGTH   Cervical rot'n: Lt 21 deg, Rt 19 deg (pain free range) Cervical Rot'n: Lt 26 deg and Rt 22 deg w/ pain at end range. Isometrics: poor in all directions secondary to pain   Cervical SB: Lt 11 deg, Rt 19 Pain at end range 3/10 LT: 16 deg  Rt 23 deg    Cervical flxn: 25 deg Pt sig restricted in cervical flexion with rotation both directions    Cervical ext: 30 deg 4/10  movements restriction in ext w/ pain.           SPECIAL TESTS   Distraction: decreases cervical pain     Palpation:  Tender to palpation along TTP of Bilat UT and cervical paraspinals: significant tightness    Posture:  Forward head  Rounded shoulders    Neuromuscular System:  Sensation:   Light touch intact throughout.    Reflexes:  not assessed today.    Coordination:  Not assessed today.    INITIAL EVAL    Functional Mobility and transfers:    Patient is independent with household and community mobility.   Balance:   Not assessed today.   Gait:  Slow: guraded        Outcomes:   INITIAL EVAL    NDI: 28              Patient Education:   Patient was educated on goals and benefits of therapy, as well as importance of performing HEP for cervical ROM and postural reeducation to enable pt to perform functional activities with decreased cervical strain and pain.    Pt. did demonstrate an understanding of this education.  Home Exercise Program was issued today..    Treatment Today:  Evaluation and HEP issued.    EVALUATION AND  DIAGNOSIS:   Anajulia Zennie Ayars is a 69 y.o. female presents to physical therapy with cervical neck pain and decrease in cervical ROM in all directions. Pt has significant tightness and pain with cervical mvmts and will benefit from progressive cervical strengthening and PROM to enable pt to perform functional activities such as care for her husband/drive/ and perform iADLS safely and with decreased cervical strain. Pt also has minimal head neck dissociation with mvmt and will benefit from training to dissociate head and trunk.        Impairments in body function and structure:  Decreased PROM  Decreased AROM  Decreased Strength   Pain  Postural Dysfunction  See above review of systems for details.    Activity Limitations / Restricted Participation:  See above review of systems for details.    PROGNOSIS:  Good for goals.    Without skilled intervention, patient may not be able to:  Perform light household chores safely and independently without risk for fall or injury.  Participate in safe and independent ADL's such as bathing, dressing, grooming.  Participate in safe and independent community ambulation for IADL's such as grocery shopping and preparing meals.  Care for husband safely and independently.    Goals:  Short Term Goals:  To be met by 12 visits  1 Increase active cervical rotation to 45 degrees for increased visual field to allow safe changing lanes and backing up vehicle while driving  2 Pt.  will report ability to sit/stand upright greater than 30 minutes to participate in family / social interactions.  3 Pt. will report ability to concentrate on household and job duties without interruption from cervical pain/dysfunction.  4 Patient will demonstrate independence  in prescribed HEP with proper form, sets and reps for safe discharge to an independent program.      Long Term Goals:  To be met by 24 visits  5. Pt will be able to lift 20 lbs from floor to counter with proper form and without cervical  strain to enable pt to perform household duties  6 Pt. will report ability to sleep through night without report of interruptions due to pain.  7 Pt. will report ability to concentrate on household and job duties without interruption from cervical pain/dysfunction.  8. Decrease Neck Disability Index (NDI) to 10 to exceed Minimal Detectable Change (MDC) of 7 points.  9 Patient will demonstrate independence in prescribed HEP with proper form, sets and reps for safe discharge to an independent program.    PLAN:  97110 Therapeutic Exercises, to include home exercise program.  97140 Manual Therapy  Modalities: 97014 Unattended Estim  97035 Ultrasound    Frequency of treatment:    2 times per week for 12 weeks.    It has been a pleasure to evaluate [facility].  Please contact me with any questions or concerns regarding this patients evaluation or ongoing therapy.     Therapist Signature:    Tylene Fantasia DPT      06/06/2020    CPT Evaluation Code Justification  CRITERIA JUSTIFICATION DESCRIPTION   Personal factors and/or co-morbidities that impact the plan of care. Factors that affect plan of care include:  Migraine history Osteoporosis arthritis Dizziness 3 (High Complexity)     Body Systems Examined Impairments noted in:  Musculoskeletal  Neuromuscular  Body structures/regions 3 or more elements (Moderate Complexity)     Clinical Presentation Presentation varies throughout session, days. Evolving (Moderate Complexity)     Clinical Decision Making Standardized Assessment used:  See below for details. Evaluation Code:  Moderate Complexity

## 2020-06-07 ENCOUNTER — Other Ambulatory Visit: Payer: Self-pay | Admitting: Nurse Practitioner

## 2020-06-07 DIAGNOSIS — R1319 Other dysphagia: Secondary | ICD-10-CM

## 2020-06-12 ENCOUNTER — Ambulatory Visit: Payer: Medicare Other

## 2020-06-12 ENCOUNTER — Encounter (INDEPENDENT_AMBULATORY_CARE_PROVIDER_SITE_OTHER): Payer: Self-pay

## 2020-06-12 DIAGNOSIS — R29898 Other symptoms and signs involving the musculoskeletal system: Secondary | ICD-10-CM

## 2020-06-13 DIAGNOSIS — R29898 Other symptoms and signs involving the musculoskeletal system: Secondary | ICD-10-CM | POA: Insufficient documentation

## 2020-06-15 ENCOUNTER — Ambulatory Visit: Payer: Medicare Other | Attending: Rheumatology

## 2020-06-15 DIAGNOSIS — M542 Cervicalgia: Secondary | ICD-10-CM | POA: Insufficient documentation

## 2020-06-15 DIAGNOSIS — R29898 Other symptoms and signs involving the musculoskeletal system: Secondary | ICD-10-CM

## 2020-06-15 NOTE — Progress Notes (Signed)
[facility]  [address], [address]  [place], [place]  [address]  Phone:  [phone]  Fax:  [phone]    PHYSICAL THERAPY PROGRESS NOTE        PATIENT: Leslie Gross DOB: 1951-05-26   MR #: [id number]  AGE: 69 y.o.    FACILITY PROVIDER #: [id number] PRIMARY MD: Christel Mormon, MD    HICN# Medicare Sub. Num: [id number] DIAGNOSES: Cervicalgia [M54.2]    CERTIFICATION PERIOD:  06/06/2020 to 09/06/2020 TREATMENT DIAGNOSIS:  Neck Pain M54.2 and Painful Cervical ROM M54.2       Date of Service PT Received On: 06/12/20   Treatment Time Start Time: 1400 to Stop Time: 1450   Time Calculation Time Calculation (min): 50 min   Visit # PT Visit  PT Visit Number: 2   Units Billed   Therapeutic Interventions  $ PT Therapeutic Exercise (97110): 1 Unit  $ PT Manual Therapy (97140): 2 Unit       Certification period, precautions, medications and allergies copied from initial evaluation - reviewed and reconciled today.  Certification Period:  06/06/2020 to 09/06/2020    Treatment Diagnosis:  Neck Pain [M54.2], Painful Cervical ROM [M54.2] and Decreased Range of Motion in neck [M29.898]    Date of onset: 3 years    Imaging Performed:  No:     Precautions:   Fall Risk  Arthritis, Breathing Difficulty, Dizziness, Osteoporosis, vision problems    Medications listed in EMR:  has a current medication list which includes the following prescription(s): acetaminophen, amitriptyline, aspirin ec, butalbital-acetaminophen-caffeine, calcium citrate-vitamin d, carvedilol, cyclobenzaprine, cyclosporine, diazepam, escitalopram, fluticasone, hydrocodone-acetaminophen, latanoprostene bunod, levothyroxine, lidocaine, losartan, mesalamine, multivitamin, multiple vitamins-minerals, naloxone, omeprazole, prednisone, sulfasalazine, vitamin d (ergocalciferol), and vitamin e.  Patient/Caregiver does not report changes to medication at this time.    Allergies:        Allergies    Allergen Reactions    Shellfish-Derived Products Angioedema, Diarrhea and Nausea And Vomiting    Fluconazole Diarrhea and Nausea And Vomiting    Penicillins Nausea And Vomiting and Abdominal Pain    Tape Other (See Comments)     Causes blisters       Education and Infection Prevention Regarding Covid-19 Pandemic:  Patient was educated on [facility]'s Policy and Procedure regarding COVID-19 care and safety precautions. Discussed and reviewed patient's medical history and personal risk factors and patient acknowledges exposure risk and wishes to proceed with care.    [id number] Screen: Patient does not present with a temperature >/= 100.0, no new cough, no new throat pain, and no new shortness of breath.     Personal Protective Equipment Utilized:  Procedure Mask        EXAMINATION:    Subjective Report:   Pt reports that she felt good after the evaluation and has been doing the stretches    Pain:    3 on a scale of 0 - 10, location: neck with mvmts    Objective Findings today:     None taken today    Palpation:  TTP of Bilat SCM: after DTM pt had a reduction in cervical strain      Treatment Performed:  MANUAL THERAPY Soft tissue mobilization to Bilat suboccipitals and UT's and Myofascial release to suboccipitals in order to restore movement in muscles , stretch shortened connective tissue , facilitate muscle relaxation.  THERAPEUTIC EXERCISES  1:1 per exercise flowsheet (see below) in order to increase ROM, increase strength, improve ability to walk  and complete daily tasks without pain.  Provided instruction in proper form and position to safely perform exercise, verbal cues for postural correction, instruction in maintaining a gentle stretch (and avoiding pain).          Exercise Specifics Date  06/12/2020   Date Date Date Date Date   Cervical retractions  10x5"        scap retraction  10x5"        Cervical rotation wtih over pressure  15x        Cervical flexion holds  10x5"                                                                                                                                                     UBE  2'CW/1'CCW            Patient Education:   Patient was educated on the importance of perform HEP For cervical stretching to reduce cervical strain. Pt also recommended to take frequent breaks and be cognizant of shoulder posturing.    Pt. did demonstrate an understanding of this education.  Home Exercise Program was reviewed today.    EVALUATION AND DIAGNOSIS:   Leslie Gross is a 69 y.o. female presents to physical therapy today with cervical neck pain and decreased cervical ROM. Pt has tightness of Bilat SCM and bilat UT. Pt required frequent vc's to decrease compensatory shoulder elevation with TE. Pt had a decrease in cervical neck strain and pain at conclusion of session    Impairments in body function and structure:  Decreased PROM  Decreased AROM  Pain    Activity Limitations / Restricted Participation:  Without skilled intervention, patient may not be able to:  Perform light household chores safely and independently without risk for fall or injury.  Participate in safe and independent ADL's such as bathing, dressing, grooming.  Participate in safe and independent community ambulation for IADL's such as grocery shopping and preparing meals.  Care for husband safely and independently.            Goals (copied from last note):  Short Term Goals:  To be met by 12 visits  1 Increase active cervical rotation to 45 degrees for increased visual field to allow safe changing lanes and backing up vehicle while driving  2 Pt.  will report ability to sit/stand upright greater than 30 minutes to participate in family / social interactions.  3 Pt. will report ability to concentrate on household and job duties without interruption from cervical pain/dysfunction.  4 Patient will demonstrate independence in prescribed HEP with proper form, sets and reps for safe discharge to an independent  program.      Long Term Goals:  To be met by 24 visits  5. Pt will be able to lift 20 lbs from floor to counter with  proper form and without cervical strain to enable pt to perform household duties  6 Pt. will report ability to sleep through night without report of interruptions due to pain.  7 Pt. will report ability to concentrate on household and job duties without interruption from cervical pain/dysfunction.  8. Decrease Neck Disability Index (NDI) to 10 to exceed Minimal Detectable Change (MDC) of 7 points.  9 Patient will demonstrate independence in prescribed HEP with proper form, sets and reps for safe discharge to an independent program.      GOAL# Progress Toward Goals   1 Progressing   2 Progressing   3 Progressing   4 Progressing               PLAN:   Patient requires continued skilled intervention in order to increase patient safety and independence with daily activities., decrease pain. and meet above mentioned functional goals.  Focus on cervical ROM and stretching in next visit.    Therapist Signature:    Tylene Fantasia DPT      06/12/2020

## 2020-06-16 ENCOUNTER — Emergency Department
Admission: EM | Admit: 2020-06-16 | Discharge: 2020-06-16 | Disposition: A | Discharge status: Home / Self Care | Payer: Medicare Other | Attending: Emergency Medicine | Admitting: Emergency Medicine

## 2020-06-16 ENCOUNTER — Emergency Department: Payer: Medicare Other

## 2020-06-16 DIAGNOSIS — Z7952 Long term (current) use of systemic steroids: Secondary | ICD-10-CM | POA: Insufficient documentation

## 2020-06-16 DIAGNOSIS — Z7982 Long term (current) use of aspirin: Secondary | ICD-10-CM | POA: Insufficient documentation

## 2020-06-16 DIAGNOSIS — M810 Age-related osteoporosis without current pathological fracture: Secondary | ICD-10-CM | POA: Insufficient documentation

## 2020-06-16 DIAGNOSIS — E039 Hypothyroidism, unspecified: Secondary | ICD-10-CM | POA: Insufficient documentation

## 2020-06-16 DIAGNOSIS — Z981 Arthrodesis status: Secondary | ICD-10-CM | POA: Insufficient documentation

## 2020-06-16 DIAGNOSIS — I4891 Unspecified atrial fibrillation: Secondary | ICD-10-CM | POA: Insufficient documentation

## 2020-06-16 DIAGNOSIS — Z86711 Personal history of pulmonary embolism: Secondary | ICD-10-CM | POA: Insufficient documentation

## 2020-06-16 DIAGNOSIS — S92312A Displaced fracture of first metatarsal bone, left foot, initial encounter for closed fracture: Secondary | ICD-10-CM | POA: Insufficient documentation

## 2020-06-16 DIAGNOSIS — I10 Essential (primary) hypertension: Secondary | ICD-10-CM | POA: Insufficient documentation

## 2020-06-16 DIAGNOSIS — W208XXA Other cause of strike by thrown, projected or falling object, initial encounter: Secondary | ICD-10-CM | POA: Insufficient documentation

## 2020-06-16 MED ORDER — ACETAMINOPHEN 500 MG PO TABS
1000.00 mg | ORAL_TABLET | Freq: Once | ORAL | Status: AC
Start: 2020-06-16 — End: 2020-06-16
  Administered 2020-06-16: 15:00:00 1000 mg via ORAL
  Filled 2020-06-16: qty 2

## 2020-06-16 MED ORDER — HYDROCODONE-ACETAMINOPHEN 5-325 MG PO TABS
1.0000 | ORAL_TABLET | Freq: Four times a day (QID) | ORAL | 0 refills | Status: DC | PRN
Start: 2020-06-16 — End: 2020-11-14

## 2020-06-16 MED ORDER — ONDANSETRON 4 MG PO TBDP
4.00 mg | ORAL_TABLET | Freq: Four times a day (QID) | ORAL | 0 refills | Status: DC | PRN
Start: 2020-06-16 — End: 2020-11-14

## 2020-06-16 MED ORDER — HYDROCODONE-ACETAMINOPHEN 5-325 MG PO TABS
2.00 | ORAL_TABLET | Freq: Once | ORAL | Status: AC
Start: 2020-06-16 — End: 2020-06-16
  Administered 2020-06-16: 17:00:00 2 via ORAL
  Filled 2020-06-16: qty 2

## 2020-06-16 NOTE — Discharge Instr - AVS First Page (Signed)
Reason for your Hospital Admission:  ***      Instructions for after your discharge:  ***    [employer]:  Name of DME Agency: [facility]]  Equipment Ordered:   Front wheel walker delivered to patient at bedside by [facility]

## 2020-06-16 NOTE — Progress Notes (Signed)
SITUATION: Pnt was assisting her husband when he was sitting down on rollator.  Husband fell out of rollator and rollator landed on her foot causing a fracture.  Pnt will need FWW, pnt will have a boot on foot.  Pnt is main caretaker for her husband who is being admitted.   BACKGROUND/ASSESSMENT:    06/16/20 1757   Patient Type   Within 30 Days of Previous Admission? No   Healthcare Decisions   Interviewed: Patient   Orientation/Decision Making Abilities of Patient Alert and Oriented x3, able to make decisions   Additional Emergency Contacts? Izell Carolina, husband   Prior to admission   Prior level of function Independent with ADLs;Ambulates independently  (Pnt has been independent in all spheres including driving.  Pnt is main caretaker for her husband who has Parkinsons.)   Type of Residence Private residence   Home Layout Two level;Stairs to enter with rails (add number in comment)  (4STEw bilateral rails into 2 level home w lift in garage for STE.  They stay on main level w bed/bath w handicap shower)   Have running water, electricity, heat, etc? Yes   Living Arrangements Spouse/significant other   How do you get to your MD appointments? self   How do you get your groceries? self   Who fixes your meals? self   Who does your laundry? self   Who picks up your prescriptions? self   Dressing Independent   Grooming Independent   Feeding Independent   Bathing Independent   Toileting Independent   DME Currently at [employer], [place], [place];Wheelchair, Manual;Walker, Four Wheel  (This DME is for her husband who has Parkinsons)   Home Care/Community Services None  (has used [facility] in past)   Prior SNF admission? (Detail) NA   Prior Rehab admission? (Detail) NA   Discharge Planning   Support Systems Spouse/significant other;Children  (Pnt reports good family support with husband and 2 chn (son local, dau [place]) Pnt does not want to ask chn for assistance but son will help as needed.)   Patient expects  to be discharged to: Home w FWW   Anticipated [place] plan discussed with: Same as interviewed   Mode of transportation: Other  (Pnt plans to drive herself home.)   Does the patient have perscription coverage? Yes   Consults/Providers   PT Evaluation Needed 2   OT Evalulation Needed 2   SLP Evaluation Needed 2   Outcome Palliative Care Screen Screened but did not meet criteria for intervention   Correct PCP listed in Epic? Yes   Family and PCP   PCP on file was verified as the current PCP? Yes   In case you are admitted, would like family notified? Yes   Name of family member to be notified husband   In case you are admitted, would like your PCP notified? Yes   Important Message from [facility] Notice   Patient received 1st IMM Letter? n/a     RECOMMENDATIONS: Pnt more worried about caring for her husband and his pending admission.  She is wanting to stay.  She was made aware of visiting hours and reassured that her husband will be cared for and that she needs to care for herself at this time.  Pnt given FWW by [facility] liaison.  No other d/c needs identified at this time.  CM will cont to follow as needed.
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
## 2020-06-16 NOTE — Progress Notes (Signed)
[employer]:  Name of DME Agency: [facility]]  Equipment Ordered:  Front wheel walker delivered to patient at bedside by [facility]

## 2020-06-16 NOTE — ED Triage Notes (Signed)
Husband's wheelchair fell on left foot.  C/o of pain and not beable to put weight on it

## 2020-06-16 NOTE — Discharge Instructions (Signed)
[employer]:  Name of DME Agency: [facility] [employer]]  Equipment Ordered:   Front wheel walker delivered to patient at bedside by [facility]       Foot Fracture    You have a fracture in your foot.    The foot is made up of 5 tarsal and 5 metatarsal bones.    A fracture is a break in a bone. It means the same thing as saying a "broken bone." In general fractures heal over about 6-8 weeks. The place that is broken will eventually become stronger than the area around it. At first, fractures are often treated with a splint. The splint keeps the injured area immobilized (still). However, the orthopedic (bone) doctor will probably exchange it for a cast. Most fractures can be managed with a splint or cast. Some need surgery for the best alignment and fracture correction. The orthopedic doctor will help decide this.    Do not put any weight on the injured foot. Use crutches to help get around.    Generally, fracture treatment includes the use of pain medicine and a splint/cast to reduce movement. Treatment also involves Resting, Icing, Compressing and Elevating the injured area. Remember this as "RICE."   REST: Limit the use of the injured body part.   ICE: By applying ice to the affected area, swelling and pain can be reduced. Place some ice cubes in a re-sealable (Ziploc) bag and add some water. Put a thin washcloth between the bag and the skin. Apply the ice bag to the area for at least 20 minutes. Do this at least 4 times per day. Using the ice for longer times and more frequently is OK. NEVER APPLY ICE DIRECTLY TO THE SKIN.   COMPRESS: Compression means to apply pressure around the injured area such as with a splint, cast or an ACE bandage. Compression decreases swelling and improves comfort. Compression should be tight enough to relieve swelling but not so tight as to decrease circulation. Increasing pain, numbness, tingling, or change in skin color, are all signs of  decreased circulation.   ELEVATE: Elevate the injured part. For example, a leg can be elevated by placing the leg on a chair while sitting or by propping it up on pillows while lying down.    You were given a SPLINT for your fracture. This is to lower pain. It also helps keep the injured area immobilized (stable). Use the splint until you follow up with the referral orthopedic (bone) doctor.    Use the following SPLINT CARE instructions. Do the following many times throughout the day:   Check capillary refill (circulation) in the nail beds. Press on the nail bed and let go. The nail bed should turn white. It should then go pink again in less than 2 seconds.   Watch to see if the area beyond the splint gets swollen.   Sometimes the splint is too tight. When this happens, the skin of the hand, foot, fingers or toes is very cold, pale or numb to the touch. The wrap holding the splint in place can be loosened or you can come back here or go to the nearest Emergency Department to have it adjusted.    YOU SHOULD SEEK MEDICAL ATTENTION IMMEDIATELY, EITHER HERE OR AT THE NEAREST EMERGENCY DEPARTMENT, IF ANY OF THE FOLLOWING OCCURS:   You have a severe increase in pain or swelling in the injured area.   You have new numbness or tingling in or  below the injured area.   Your foot gets cold and pale. This could mean that the foot has a problem with its blood supply.    Pain Opioid Medications (Edu)    Below is important information about medicines that contain opioids.    You have been prescribed medicine that contains an opioid.    Prescription opioids can help relieve moderate to severe pain. Opiate is a word for drugs that act like morphine. These include pain medicines. The word narcotic" is also used for opiate drugs.     Patients are sometimes prescribed an opioid after surgery, broken bones or for pain related to some health conditions.    Some common opioid medicines include (but are not limited  to):   Codeine (Fioricet with Codeine, Fiorinol with Codeine, Tylenol with Codeine).   Hydrocodone (Vicodin, Norco, Lortab).   Oxycodone (Percocet).   Morphine (AVINza, Kadian, Kadian ER, Morphabond, MS Contin, MSIR, Oramorph SR, Roxanol, Roxanol-T).   Hydromorphone (Dilaudid).   Fentanyl (Actiq, Fentora, Duragesic).    While these medicines may relieve pain in the short term, they can come with serious risks. As many as 1 in 4 people taking these types of pain medicines become addicted to them.     Risks and side effects of opioid use include:   Addiction - Your body can become physically dependent on these medicines. If you stop taking the medicine suddenly, you have symptoms of withdrawal. Some symptoms of opioid withdrawal can be:  - Body aches.  - Diarrhea.  - Nausea (upset stomach).  - Vomiting (throwing up).  - Restlessness.  - Fast heart rate.  - Sweating.  - Goose bumps.  - Insomnia (not being able to sleep).  - Tremors (shaking).  - Too much yawning.  - Anxiety.   Tolerance - Your body will need higher doses of medicine to control the same level of pain over time.   Overdose - This can cause death.    More sensitivity to pain.   Constipation (not able to poop).   Nausea, vomiting and dry mouth.   Sleepiness and dizziness. While taking opioids:   - DO NOT drive a car or operate heavy machinery.   - DO NOT perform jobs that need you to be alert.   - DO NOT make any important decisions.   Confusion.   Depression.   Low levels of testosterone - This can lead to lower sex drive, energy and strength.   Itching and sweating.    People considered at a high risk to use opioids are those with a history of:    Drug misuse, substance abuse disorder or overdose.   Mental health conditions such as depression, anxiety or an addiction disorder.   Sleep apnea (when there are abnormal pauses in breathing during sleep).   65 years or older.   Pregnant or breastfeeding  mothers.    Overdose of an opioid can lead to significantly slowed breathing and can result in death. Taking opioids with other prescription medicines can increase your risk of overdose.     The following products and medicines, when combined with opioids, increase your risk of overdose (this is only a partial list of drugs that make opiate overdose worse):   Alcohol.   Benzodiazepines (lorazepam/Ativan, clonazepam/Klonopin, diazepam/Valium, alprazolam/Xanax).   Muscle relaxants (cyclobenzaprine/Flexeril, metaxalone/Skelaxin, carisoprodol/Soma).   Hypnotics or sleeping pills (zolpidem/Ambien, eszopiclone/Lunesta).   Other prescription opioids (see above).   Other non-prescription opioids (Heroin, Carfentanyl).    It is important to  know your options to control your pain. Other options for pain control besides the use of opioids can be:   Acetaminophen (Tylenol) or ibuprofen (Advil, Motrin).   Supportive care such as ice, heat, massage or stretching.    Physical therapy or exercise.   Medicines also used for depression or seizures (often help with nerve-related pain).   Cognitive behavioral therapy - a more natural, psychological, goal-directed approach - teaches you how to change physical, emotional or behavioral triggers. These therapies can help you learn to control how you respond to stress and pain.     If your doctor prescribes opioid medications, consider the following safety tips:   Take the lowest effective dose.    Never take more often than prescribed.   Never sell or share prescription opioids.   Never use another persons prescribed opioids.   Store prescription opioids in a secure place away from children, teenagers, friends and family.    Safely dispose of unused prescription opioids at a community drug take-back program or your pharmacy mail-back program. Call your local pharmacist for more information if needed.   If you are struggling with addiction, tell your health  care provider or seek help from [facility] at 1-800-662-HELP.   Follow up as recommended by your prescribing doctor.    SEEK MEDICAL ATTENTION IMMEDIATELY, EITHER HERE OR AT THE NEAREST EMERGENCY DEPARTMENT, IF ANY OF THE FOLLOWING HAPPENS:     You have trouble breathing.   You think about hurting yourself or others.    You have chest pain, palpitations.   You have nausea, vomiting, and/or diarrhea.     If you cant follow up with your doctor, or if you feel you need to be rechecked or seen again, come back here or go to the nearest emergency department.     You have been prescribed Norco (hydrocodone/acetamenophen).    1. No concurrent Tylenol, acetaminophen, APAP use.  2. You must not drive, operate machinery, preform dangerous activities for at least 6 hours after taking Norco (hydrocodone/acetamenophen).  3.  Norco (hydrocodone/acetamenophen)has addiction potential. Therefore, only use this medication for the treatment of acute pain.  4.  Norco (hydrocodone/acetamenophen)may cause constipation, itching, dizziness and nausea. Stop this medication if you have any adverse reactions.                Boot when out of bed.

## 2020-06-22 ENCOUNTER — Ambulatory Visit: Payer: Medicare Other

## 2020-06-26 ENCOUNTER — Ambulatory Visit: Payer: Medicare Other

## 2020-06-26 NOTE — Progress Notes (Signed)
[facility]  [address], [address]  [place], [place]  [address]  Phone:  [phone]  Fax:  [phone]    PHYSICAL THERAPY PROGRESS NOTE        PATIENT: Leslie Gross DOB: 1951/03/25   MR #: [id number]  AGE: 69 y.o.    FACILITY PROVIDER #: [id number] PRIMARY MD: Christel Mormon, MD    HICN# Medicare Sub. Num: [id number] DIAGNOSES: Cervicalgia [M54.2]    CERTIFICATION PERIOD:  06/06/2020 to 09/06/2020 TREATMENT DIAGNOSIS:  Neck Pain M54.2 and Painful Cervical ROM M54.2       Date of Service PT Received On: 06/15/20   Treatment Time Start Time: 1100 to Stop Time: 1200   Time Calculation Time Calculation (min): 60 min   Visit # PT Visit  PT Visit Number: 3   Units Billed   Therapeutic Interventions  $ PT Therapeutic Exercise (97110): 2 Units (30 min)  $ PT Manual Therapy (97140): 2 Unit (30 min)       Certification period, precautions, medications and allergies copied from initial evaluation - reviewed and reconciled today.  Certification Period:  06/06/2020 to 09/06/2020    Treatment Diagnosis:  Neck Pain [M54.2], Painful Cervical ROM [M54.2] and Decreased Range of Motion in neck [M29.898]    Date of onset: 3 years    Imaging Performed:  No:     Precautions:   Fall Risk  Arthritis, Breathing Difficulty, Dizziness, Osteoporosis, vision problems    Medications listed in EMR:  has a current medication list which includes the following prescription(s): acetaminophen, amitriptyline, aspirin ec, butalbital-acetaminophen-caffeine, calcium citrate-vitamin d, carvedilol, cyclobenzaprine, cyclosporine, diazepam, escitalopram, fluticasone, hydrocodone-acetaminophen, latanoprostene bunod, levothyroxine, lidocaine, losartan, mesalamine, multivitamin, multiple vitamins-minerals, naloxone, omeprazole, prednisone, sulfasalazine, vitamin d (ergocalciferol), and vitamin e.  Patient/Caregiver does not report changes to medication at this time.    Allergies:         Allergies   Allergen Reactions    Shellfish-Derived Products Angioedema, Diarrhea and Nausea And Vomiting    Fluconazole Diarrhea and Nausea And Vomiting    Penicillins Nausea And Vomiting and Abdominal Pain    Tape Other (See Comments)     Causes blisters       Education and Infection Prevention Regarding Covid-19 Pandemic:  Patient was educated on [facility] Policy and Procedure regarding COVID-19 care and safety precautions. Discussed and reviewed patient's medical history and personal risk factors and patient acknowledges exposure risk and wishes to proceed with care.    [id number] Screen: Patient does not present with a temperature >/= 100.0, no new cough, no new throat pain, and no new shortness of breath.     Personal Protective Equipment Utilized:  Procedure Mask        EXAMINATION:    Subjective Report:   Pt reports that her neck is very sore and achy today. Pt reports that she had to do alot this weekend because husband has Parkinson's and is unable to do anything safely and independently     Pain:    3 on a scale of 0 - 10, location: neck with mvmts    Objective Findings today:     None taken today    Palpation:  TTP of Bilat SCM: after DTM pt had a reduction in cervical strain      Treatment Performed:  MANUAL THERAPY Soft tissue mobilization to Bilat suboccipitals and UT's and Myofascial release to suboccipitals in order to restore movement in muscles , stretch shortened connective tissue ,  facilitate muscle relaxation.  THERAPEUTIC EXERCISES  1:1 per exercise flowsheet (see below) in order to increase ROM, increase strength, improve ability to walk and complete daily tasks without pain.  Provided instruction in proper form and position to safely perform exercise, verbal cues for postural correction, instruction in maintaining a gentle stretch (and avoiding pain).          Exercise Specifics Date  06/12/2020   Date  06/15/2020 Date Date Date Date   Cervical retractions  10x5" 10x7"       scap  retraction  10x5" 10x7"       Cervical rotation with over pressure  15x 2x10 in each direction       Cervical flexion holds  10x5" "       Wall angels   15x       Shoulder sweeps: flexion/horiz abduction   3lbs  15 lbs       Chin tucks seated  10x5"                                                                                                                     UBE  2'CW/1'CCW 1/2' due to pain           Patient Education:   Patient was educated on the importance of perform HEP For cervical stretching to reduce cervical strain. Pt also recommended to take frequent breaks and be cognizant of shoulder posturing.    Pt. did demonstrate an understanding of this education.  Home Exercise Program was reviewed today.    EVALUATION AND DIAGNOSIS:   [name] is a 69 y.o. female presents to physical therapy today with cervical neck pain and decreased cervical ROM.   Pt was able to perform all TE without exacerbation of sx. Pt had a decrease in cervical stiffness and pain after manual therapy/gentle distraction/STM to Bilat UT.   Reviewed pt's HEP to ensure understanding of cervical ROM within pain free range and proper lifting to enable pt to drive and care for her husband with decreased cervical strain    Impairments in body function and structure:  Decreased PROM  Decreased AROM  Pain    Activity Limitations / Restricted Participation:  Without skilled intervention, patient may not be able to:  Perform light household chores safely and independently without risk for fall or injury.  Participate in safe and independent ADL's such as bathing, dressing, grooming.  Participate in safe and independent community ambulation for IADL's such as grocery shopping and preparing meals.  Care for husband safely and independently.            Goals (copied from last note):  Short Term Goals:  To be met by 12 visits  1 Increase active cervical rotation to 45 degrees for increased visual field to allow safe changing lanes and  backing up vehicle while driving  2 Pt.  will report ability to sit/stand upright greater than 30 minutes to participate in family / social interactions.  3 Pt.  will report ability to concentrate on household and job duties without interruption from cervical pain/dysfunction.  4 Patient will demonstrate independence in prescribed HEP with proper form, sets and reps for safe discharge to an independent program.      Long Term Goals:  To be met by 24 visits  5. Pt will be able to lift 20 lbs from floor to counter with proper form and without cervical strain to enable pt to perform household duties  6 Pt. will report ability to sleep through night without report of interruptions due to pain.  7 Pt. will report ability to concentrate on household and job duties without interruption from cervical pain/dysfunction.  8. Decrease Neck Disability Index (NDI) to 10 to exceed Minimal Detectable Change (MDC) of 7 points.  9 Patient will demonstrate independence in prescribed HEP with proper form, sets and reps for safe discharge to an independent program.      GOAL# Progress Toward Goals   1 Progressing   2 Progressing   3 Progressing   4 Progressing               PLAN:   Patient requires continued skilled intervention in order to increase patient safety and independence with daily activities., decrease pain. and meet above mentioned functional goals.  Focus on cervical ROM and stretching in next visit.    Therapist Signature:    Tylene Fantasia DPT      06/15/2020

## 2020-06-29 ENCOUNTER — Ambulatory Visit: Payer: Medicare Other

## 2020-07-03 ENCOUNTER — Ambulatory Visit: Payer: Medicare Other

## 2020-07-06 ENCOUNTER — Ambulatory Visit: Payer: Medicare Other

## 2020-07-10 ENCOUNTER — Ambulatory Visit: Payer: Medicare Other

## 2020-07-13 ENCOUNTER — Emergency Department: Payer: Medicare Other

## 2020-07-13 ENCOUNTER — Ambulatory Visit: Payer: Medicare Other

## 2020-07-13 ENCOUNTER — Emergency Department
Admission: EM | Admit: 2020-07-13 | Discharge: 2020-07-13 | Disposition: A | Discharge status: Home / Self Care | Payer: Medicare Other | Attending: Emergency Medicine | Admitting: Emergency Medicine

## 2020-07-13 ENCOUNTER — Other Ambulatory Visit: Payer: Self-pay

## 2020-07-13 DIAGNOSIS — M545 Low back pain, unspecified: Secondary | ICD-10-CM

## 2020-07-13 DIAGNOSIS — E039 Hypothyroidism, unspecified: Secondary | ICD-10-CM | POA: Insufficient documentation

## 2020-07-13 DIAGNOSIS — M25561 Pain in right knee: Secondary | ICD-10-CM | POA: Insufficient documentation

## 2020-07-13 DIAGNOSIS — S8001XA Contusion of right knee, initial encounter: Secondary | ICD-10-CM

## 2020-07-13 DIAGNOSIS — W19XXXA Unspecified fall, initial encounter: Secondary | ICD-10-CM | POA: Insufficient documentation

## 2020-07-13 DIAGNOSIS — I10 Essential (primary) hypertension: Secondary | ICD-10-CM | POA: Insufficient documentation

## 2020-07-13 DIAGNOSIS — Z86711 Personal history of pulmonary embolism: Secondary | ICD-10-CM | POA: Insufficient documentation

## 2020-07-13 DIAGNOSIS — M542 Cervicalgia: Secondary | ICD-10-CM

## 2020-07-13 MED ORDER — KETOROLAC TROMETHAMINE 30 MG/ML IJ SOLN
30.00 mg | Freq: Once | INTRAMUSCULAR | Status: AC
Start: 2020-07-13 — End: 2020-07-13
  Administered 2020-07-13: 17:00:00 30 mg via INTRAMUSCULAR
  Filled 2020-07-13: qty 1

## 2020-07-13 MED ORDER — LIDOCAINE 5 % EX PTCH
2.00 | MEDICATED_PATCH | CUTANEOUS | Status: DC
Start: 2020-07-13 — End: 2020-07-13
  Administered 2020-07-13: 17:00:00 2 via TRANSDERMAL
  Filled 2020-07-13: qty 2

## 2020-07-13 MED ORDER — LIDOCAINE 5 % EX PTCH
1.00 | MEDICATED_PATCH | CUTANEOUS | 0 refills | Status: DC
Start: 2020-07-13 — End: 2020-11-14

## 2020-07-13 MED ORDER — ACETAMINOPHEN 500 MG PO TABS
1000.0000 mg | ORAL_TABLET | Freq: Once | ORAL | Status: AC
Start: 2020-07-13 — End: 2020-07-13
  Administered 2020-07-13: 17:00:00 1000 mg via ORAL
  Filled 2020-07-13: qty 2

## 2020-07-13 MED ORDER — CYCLOBENZAPRINE HCL 10 MG PO TABS
10.0000 mg | ORAL_TABLET | Freq: Three times a day (TID) | ORAL | 0 refills | Status: AC | PRN
Start: 2020-07-13 — End: 2020-07-28

## 2020-07-13 NOTE — Discharge Instructions (Signed)
Please rest and avoid aggravating activities. Please follow-up. Return to the ER for any concerns.     Arthralgia    You have been diagnosed with Arthralgia.    Arthralgia means pain and stiffness of the joints. People often describe the pain as aching or throbbing. Arthralgia can affect one or more joints. It can be caused by many types of conditions and/or injuries. Often, arthralgia lasts for a long time and people need treatment over months or years. Some causes of arthralgia are:     Infection with a virus.   Many types of infections that are starting to improve. When recovering from infection, sometimes there is joint pain.    Autoimmune diseases (where the body attacks itself). Examples are Lupus or Rheumatoid Arthritis.   Inflammation of the tendons or the fluid-filled sacs (bursa) surrounding your joints.   Low thyroid function.   Depression.     You might need another exam or more tests to find out why you have arthralgias. At this time, the cause of your symptoms does not seem dangerous. You do not need to stay in the hospital.    We dont believe your condition is dangerous right now. However, you need to be careful. Sometimes a problem that seems small can get serious later. This is why it is very important to come back here or go to the nearest Emergency Department unless you are much improved.    Clues that joint pain is dangerous are:     Hot and swollen joints. This may mean they are infected.   Fever (Temperature higher than 100.39F or 38C), weight loss and feeling very ill can be symptoms of severe infection (sepsis).   Severe pain, weakness or numbness (loss of feeling).    Some things you can try at home are:     Over-the-counter pain medications.   Heating pads and warm baths.   Physical therapy.    Follow the instructions for any medication you get prescribed.     Have a close follow-up with your primary care doctor.    YOU SHOULD SEEK MEDICAL ATTENTION IMMEDIATELY,  EITHER HERE OR AT THE NEAREST EMERGENCY DEPARTMENT, IF ANY OF THE FOLLOWING OCCURS:     You have a fever (temperature higher than 100.39F or 38C).   Your pain does not go away or gets worse.   The joint that hurts turns red and/or gets swollen.   You suddenly can't walk.   You don't feel better after treatment or feel you're getting worse.   You get any other symptoms, concerns, or don't get better as expected.    If you can't follow up with your doctor, or if at any time you feel you need to be rechecked or seen again, come back here or go to the nearest emergency department.               Back Pain NOS    You have been seen for back pain.    Back pain can happen anywhere from the neck down to the low back. Back pain has many different causes. Some of the more common are: Bone pain, muscle strain, muscle spasm, pain from overuse, and pinched nerves. Other problems can cause what feels like back pain. But the pain is really coming from another organ. A kidney infection can cause lower back pain.    The CT scan of your back showed no broken bones.    The doctor still does not know the exact  cause of your pain. Your problem does not seem to be from a dangerous cause. It is OK for you to go home today.    Some things you can try to help your back feel better are:   Apply a warm damp washcloth to the back where you have pain for 20 minutes at a time, at least 4 times per day.   Have someone massage the sore parts of your back.   Don't do any heavy lifting or bending. You can go back to normal daily activities if they don't make the pain worse.   You can use anti-inflammatory pain medicine for your pain. This could be Ibuprofen (Advil or Motrin). You can buy these at most stores. Follow the directions on the package.    This pain may last for the next few days.     Call your doctor or go to the nearest Emergency Department if you your pain does not improve within 4 weeks or your pain is bad enough  to seriously limit your normal activities.    YOU SHOULD SEEK MEDICAL ATTENTION IMMEDIATELY, EITHER HERE OR AT THE NEAREST EMERGENCY DEPARTMENT, IF ANY OF THE FOLLOWING OCCURS:   You think the pain is coming from somewhere other than your back. This can include chest pain. This is sometimes from angina (heart pains) or other dangerous causes.   You have shortness of breath, sweating, chest pain (or pressure, heaviness, indigestion, etc).   You have abdominal (belly) pain that goes through to your back.   Your arms and legs tingle or get numb (lose feeling).   Your arms or legs are weak.   You lose control of your bladder or bowels. If this were to happen, it may cause you to wet or soil yourself.   You have problems urinating (peeing).   You have fever (temperature higher than 100.35F / 38C).   Your pain gets worse.   Your symptoms get worse or you have new symptoms or concerns.    If you can't follow up with your doctor, or if at any time you feel you need to be rechecked or seen again, come back here or go to the nearest emergency department.

## 2020-07-13 NOTE — ED Triage Notes (Signed)
Larey Seat last week due to cast on left foot. Pt states hit head, right knee, and back. Symptoms have cleared up besides pain in right knee. Pt ambulates at home with wheelchair, sometimes with walker. Painful when moving. No pain upon palpation.
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
## 2020-07-17 ENCOUNTER — Ambulatory Visit: Payer: Medicare Other

## 2020-07-20 ENCOUNTER — Ambulatory Visit: Payer: Medicare Other

## 2020-07-24 ENCOUNTER — Ambulatory Visit: Payer: Medicare Other

## 2020-07-25 ENCOUNTER — Ambulatory Visit
Admission: RE | Admit: 2020-07-25 | Discharge: 2020-07-25 | Disposition: A | Discharge status: Home / Self Care | Payer: Medicare Other | Source: Ambulatory Visit | Attending: Nurse Practitioner | Admitting: Nurse Practitioner

## 2020-07-25 ENCOUNTER — Other Ambulatory Visit: Payer: Self-pay | Admitting: Nurse Practitioner

## 2020-07-25 DIAGNOSIS — R1319 Other dysphagia: Secondary | ICD-10-CM

## 2020-07-25 MED ORDER — BARIUM SULFATE 96 % PO SUSR
380.0000 mL | Freq: Once | ORAL | Status: AC | PRN
Start: 2020-07-25 — End: 2020-07-25
  Administered 2020-07-25: 10:00:00 380 mL via ORAL

## 2020-07-27 ENCOUNTER — Ambulatory Visit: Payer: Medicare Other

## 2020-07-31 ENCOUNTER — Ambulatory Visit: Payer: Medicare Other

## 2020-08-03 ENCOUNTER — Ambulatory Visit: Payer: Medicare Other

## 2020-08-07 ENCOUNTER — Ambulatory Visit: Payer: Medicare Other

## 2020-08-10 ENCOUNTER — Ambulatory Visit: Payer: Medicare Other

## 2020-08-14 ENCOUNTER — Ambulatory Visit: Payer: Medicare Other

## 2020-08-17 ENCOUNTER — Ambulatory Visit: Payer: Medicare Other

## 2020-08-21 ENCOUNTER — Ambulatory Visit: Payer: Medicare Other

## 2020-08-24 ENCOUNTER — Ambulatory Visit: Payer: Medicare Other

## 2020-08-28 ENCOUNTER — Ambulatory Visit: Payer: Medicare Other

## 2020-08-31 ENCOUNTER — Ambulatory Visit: Payer: Medicare Other

## 2020-09-04 ENCOUNTER — Ambulatory Visit: Payer: Medicare Other

## 2020-09-04 ENCOUNTER — Other Ambulatory Visit: Payer: Self-pay | Admitting: Rheumatology

## 2020-09-05 ENCOUNTER — Ambulatory Visit (INDEPENDENT_AMBULATORY_CARE_PROVIDER_SITE_OTHER): Payer: Medicare Other | Admitting: Cardiology

## 2020-09-11 ENCOUNTER — Ambulatory Visit: Payer: Medicare Other

## 2020-10-16 ENCOUNTER — Ambulatory Visit (INDEPENDENT_AMBULATORY_CARE_PROVIDER_SITE_OTHER): Payer: Medicare Other | Admitting: Cardiovascular Disease

## 2020-11-14 ENCOUNTER — Ambulatory Visit (INDEPENDENT_AMBULATORY_CARE_PROVIDER_SITE_OTHER): Payer: Medicare Other | Admitting: Cardiovascular Disease

## 2020-11-14 ENCOUNTER — Encounter (INDEPENDENT_AMBULATORY_CARE_PROVIDER_SITE_OTHER): Payer: Self-pay | Admitting: Cardiovascular Disease

## 2020-11-14 VITALS — BP 107/64 | HR 80 | Ht 64.0 in | Wt 165.0 lb

## 2020-11-14 DIAGNOSIS — I9789 Other postprocedural complications and disorders of the circulatory system, not elsewhere classified: Secondary | ICD-10-CM | POA: Insufficient documentation

## 2020-11-14 DIAGNOSIS — I2510 Atherosclerotic heart disease of native coronary artery without angina pectoris: Secondary | ICD-10-CM | POA: Insufficient documentation

## 2020-11-14 DIAGNOSIS — I4891 Unspecified atrial fibrillation: Secondary | ICD-10-CM | POA: Insufficient documentation

## 2020-11-14 DIAGNOSIS — I429 Cardiomyopathy, unspecified: Secondary | ICD-10-CM

## 2020-11-14 DIAGNOSIS — Z9884 Bariatric surgery status: Secondary | ICD-10-CM | POA: Insufficient documentation

## 2020-11-14 DIAGNOSIS — I10 Essential (primary) hypertension: Secondary | ICD-10-CM

## 2020-11-14 HISTORY — DX: Atherosclerotic heart disease of native coronary artery without angina pectoris: I25.10

## 2020-11-14 HISTORY — DX: Bariatric surgery status: Z98.84

## 2020-11-14 NOTE — Progress Notes (Signed)
[facility] OFFICE PROGRESS NOTE    HRT [facility] OFFICE -CARDIOLOGY  [address]  [place] [address]  Dept: [phone]  Dept Fax: [phone]       Patient Name: Leslie Gross    Date of Visit:  November 14, 2020  Date of Birth: June 27, 1951  AGE: 70 y.o.  Medical Record #: [id number]  Requesting Physician: Christel Mormon, MD      CHIEF COMPLAINT: Hypertension and Cardiomyopathy      HISTORY OF PRESENT ILLNESS:    She is a pleasant 70 y.o. female who presents today for follow-up with respect to her hypertension.  She last had a telephone call with [place] on 12/30/2018.  She just saw [place] for her pulmonary hypertension, and in the past many years ago a single episode of paroxysmal atrial fibrillation that was attributed to being postoperative after gastric bypass surgery.  She was taken off her oral anticoagulant when she lost weight and was not resumed since that time.    For the most part she is doing quite well.  She denies any chest pain or shortness of breath, PND/orthopnea, lightheaded/syncope, palpitations nor does she have any significant lower extremity edema.    She and her husband share a very fast eating history.  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.[name]      PAST MEDICAL HISTORY: She has a past medical history of Anemia, Arrhythmia (Dg 2008), Asthma, Blood transfusion without reported diagnosis (2012), Cardiomegaly, Depression, Difficulty in walking(719.7), Echocardiogram (04/2007, 06/2018), Essential (primary) hypertension (08/14/2017), Fracture (SEPT MID 2019), Fracture (06/2017), GERD (gastroesophageal reflux disease), Glaucoma (DX 2012), Headache, Holter monitor (03/2017), Hypothyroidism, Left rib fracture, Mitral valve prolapse, Mitral valve regurgitation, MPI - Lexiscan (03/2017), Murmur, Osteoporosis, Pancreatitis (2012), Polymyalgia rheumatica, Polymyositis, Pulmonary embolism,  Shoulder pain (DX JULY 2019), Sleep apnea, and Ulcerative colitis, chronic (DX 1990). She has a past surgical history that includes Dilation and curettage of uterus (2011); Cystostomy w/ bladder biopsy (08/2010); Esophagogastroduodenoscopy (2012 MARCH); Lung biopsy (Right, 1998); Ankle Fusion (Left, 1981); Knee surgery (Left, 1981); Cesarean section (1984, 1979); Shoulder muscle transfer (Right, 1972); Tonsillectomy (1956); Gastric bypass (2006); EGD, DILATION (N/A, 12/06/2016); Colonoscopy (N/A, 12/06/2016); Breast surgery (2952-8413); Joint replacement (2012); Fracture surgery; Cholecystectomy (2013); EGD, DILATION (BTN 2011 TRU 2018); D&C DIAGNOSTIC (2011); Cardiac catheterization (2009); Cosmetic surgery (1983); Rotator cuff repair (Right, 2014); EGD, COLONOSCOPY (N/A, 07/30/2018); Rotator cuff repair (Right); Pancreas surgery; and Reduction mammaplasty.    ALLERGIES:   Allergies   Allergen Reactions    Shellfish-Derived Products Angioedema, Diarrhea and Nausea And Vomiting    Fluconazole Diarrhea and Nausea And Vomiting    Penicillins Nausea And Vomiting and Abdominal Pain    Tape Other (See Comments)     Causes blisters       MEDICATIONS:   Current Outpatient Medications   Medication Sig    acetaminophen (TYLENOL) 500 MG tablet Take 1,000 mg by mouth 2 (two) times daily    amitriptyline (ELAVIL) 25 MG tablet Take 25 mg by mouth nightly.        Calcium Carbonate-Vitamin D (CALCIUM + D PO) Take 2 tablets by mouth Daily after lunch CALCIUM 500 MG  WITH VIT D 700IU      carisoprodol (SOMA) 350 MG tablet Take 350 mg by mouth 3 (three) times daily as needed    carvedilol (Coreg CR) 10 MG 24 hr capsule Take 1 capsule (10 mg total)  by mouth every morning Further refills at overdue annual (Patient taking differently: Take 10 mg by mouth every morning   )    Cyanocobalamin (B-12) 2500 MCG Tab Take by mouth    cyclobenzaprine (FLEXERIL) 5 MG tablet Take 1 tablet (5 mg total) by mouth 3 (three) times daily as  needed for Muscle spasms.    cycloSPORINE (RESTASIS) 0.05 % ophthalmic emulsion Place 1 drop into both eyes as needed       diazePAM (VALIUM) 2 MG tablet Take 1 tablet (2 mg total) by mouth every 12 (twelve) hours as needed for Anxiety    escitalopram (LEXAPRO) 20 MG tablet Take 20 mg by mouth every morning       famotidine (PEPCID) 20 MG tablet Take 20 mg by mouth every 12 (twelve) hours    Latanoprostene Bunod (VYZULTA) 0.024 % Solution Apply 1 drop to eye nightly    losartan (COZAAR) 25 MG tablet Take 1 tablet (25 mg total) by mouth daily    mesalamine (LIALDA) 1.2 G EC tablet Take 4,800 mg by mouth every morning with breakfast       Multiple Vitamins-Minerals (PRESERVISION AREDS 2 PO) Take 2 tablets by mouth every evening       omeprazole (PRILOSEC) 20 MG capsule Take 40 mg by mouth daily       sulfaSALAzine (AZULFIDINE) 500 MG EC tablet Take 1 tablet by mouth 2 (two) times daily    Synthroid 200 MCG tablet Take 200 mcg by mouth daily    vitamin D, ergocalciferol, (DRISDOL) 50000 UNIT Cap Take 50,000 Units by mouth once a week EVERY MONDAY        FAMILY HISTORY: family history includes Cancer in her mother and paternal grandparent; Coronary artery disease in her paternal grandfather and paternal grandmother; Diabetes in her father and paternal grandparent; Myocardial Infarction in her father; Stroke in her father.    SOCIAL HISTORY: She reports that she has never smoked. She has never used smokeless tobacco. She reports current alcohol use. She reports that she does not use drugs.    PHYSICAL EXAMINATION    Visit Vitals  Blood Pressure 107/64 (BP Site: Left arm, Patient Position: Sitting)   Pulse 80   Height 1.626 m (5\' 4" )   Weight 74.8 kg (165 lb)   Body Mass Index 28.32 kg/m       Physical Exam  Vitals and nursing note reviewed.   Constitutional:       Appearance: Normal appearance.   HENT:      Right Ear: External ear normal.      Left Ear: External ear normal.      Nose: Nose normal.       Mouth/Throat:      Mouth: Mucous membranes are moist.   Eyes:      General: No scleral icterus.  Cardiovascular:      Rate and Rhythm: Normal rate and regular rhythm.      Pulses: Normal pulses.      Heart sounds: Normal heart sounds. No murmur heard.      Pulmonary:      Effort: Pulmonary effort is normal. No respiratory distress.      Breath sounds: Normal breath sounds. No wheezing or rales.   Abdominal:      General: There is no distension.      Palpations: Abdomen is soft.      Tenderness: There is no abdominal tenderness. There is no guarding.   Musculoskeletal:  Cervical back: Neck supple.      Right lower leg: No edema.      Left lower leg: No edema.   Skin:     General: Skin is warm and dry.   Neurological:      General: No focal deficit present.      Mental Status: She is alert and oriented to person, place, and time.   Psychiatric:         Mood and Affect: Mood normal.         Behavior: Behavior normal.           1. Cardiomyopathy, unspecified type     2. Essential (primary) hypertension  Comprehensive metabolic panel    CBC and differential    Lipid panel    TSH    OFFICE VISIT 30 MIN (HRT [place])    CANCELED: ECG 12 lead (Normal)   3. Postoperative atrial fibrillation     4. History of gastric bypass     5. Coronary artery calcification seen on CT scan           IMPRESSION:   Ms. Beecham is a 70 y.o. female with the following problems:    1. History of cardiomyopathy seen by TEE with Dr. Nestor Lewandowsky years ago.  This has been attributed to poorly controlled blood pressure.  Her last echo 07/02/2018 showed normal LV function.  2. Nonischemic SPECT MPI 2018.  3. Coronary artery calcification seen on a chest CT 06/16/2020  4. Single echo episode of postoperative atrial fibrillation when she was heavier for gastric bypass surgery.  Palpitations have since resolved.  She has declined anticoagulation at that time.  5. Polymyalgia rheumatica history.  6. Hypertension, then hypotension, now normal on current  regimen.  7. Hypothyroidism on supplementation.        RECOMMENDATIONS:     Continue her current regimen of beta-blocker and low-dose ARB since her blood pressure appears to be well controlled.   Previously not inclined towards continuing anticoagulation but that was when we had documented palpitations and atrial fibrillation in the past.   Given the coronary calcium, check lipids and consider an aspirin statin at the next visit.   Consider ischemic evaluation at the next visit since we are approaching 3 to 4 years.                                               Orders Placed This Encounter   Procedures    Comprehensive metabolic panel    CBC and differential    Lipid panel    TSH    OFFICE VISIT 30 MIN (HRT [place])   Once again it was a pleasure to participate in her care.  If you have any questions, please do not hesitate to contact our office.      With warmest of regards,    Francesco Runner. Roselle Locus, MD, [facility]      This note was generated by the Dragon speech recognition and may contain errors or omissions not intended by the user. Grammatical errors, random word insertions, deletions, pronoun errors, and incomplete sentences are occasional consequences of this technology due to software limitations. Not all errors are caught or corrected. If there are questions or concerns about the content of this note or information contained within the body of this dictation, they should be addressed directly with the  author for clarification.

## 2020-11-22 ENCOUNTER — Other Ambulatory Visit (INDEPENDENT_AMBULATORY_CARE_PROVIDER_SITE_OTHER): Payer: Self-pay | Admitting: Cardiovascular Disease

## 2020-11-22 LAB — COMPREHENSIVE METABOLIC PANEL
ALT: 48 U/L — ABNORMAL HIGH (ref 6–29)
AST (SGOT): 36 U/L — ABNORMAL HIGH (ref 10–35)
Albumin/Globulin Ratio: 1.8 (calc) (ref 1.0–2.5)
Albumin: 4 g/dL (ref 3.6–5.1)
Alkaline Phosphatase: 144 U/L (ref 37–153)
BUN / Creatinine Ratio: 27 (calc) — ABNORMAL HIGH (ref 6–22)
BUN: 13 mg/dL (ref 7–25)
Bilirubin, Total: 0.5 mg/dL (ref 0.2–1.2)
CO2: 27 mmol/L (ref 20–32)
Calcium: 8.8 mg/dL (ref 8.6–10.4)
Chloride: 109 mmol/L (ref 98–110)
Creatinine: 0.48 mg/dL — ABNORMAL LOW (ref 0.60–0.93)
EGFR African American: 114 mL/min/{1.73_m2} (ref >=60)
Globulin: 2.2 g/dL (calc) (ref 1.9–3.7)
Glucose: 106 mg/dL — ABNORMAL HIGH (ref 65–99)
NON-AFRICAN AMERICA EGFR: 99 mL/min/{1.73_m2} (ref >=60)
Potassium: 4.1 mmol/L (ref 3.5–5.3)
Protein, Total: 6.2 g/dL (ref 6.1–8.1)
Sodium: 143 mmol/L (ref 135–146)

## 2020-11-22 LAB — LIPID PANEL
Cholesterol / HDL Ratio: 2.9 (calc) (ref <5.0)
Cholesterol: 206 mg/dL — ABNORMAL HIGH (ref <200)
HDL: 71 mg/dL (ref >=50)
LDL Calculated: 116 mg/dL (calc) — ABNORMAL HIGH
NON HDL CHOLESTEROL: 135 mg/dL (calc) — ABNORMAL HIGH (ref <130)
Triglycerides: 86 mg/dL (ref <150)

## 2020-11-22 LAB — CBC AND DIFFERENTIAL
Baso(Absolute): 50 cells/uL (ref 0–200)
Basophils: 0.9 %
Eosinophils Absolute: 0 cells/uL — ABNORMAL LOW (ref 15–500)
Eosinophils: 0 %
Hematocrit: 36.3 % (ref 35.0–45.0)
Hemoglobin: 11.7 g/dL (ref 11.7–15.5)
Lymphocytes Absolute: 1400 cells/uL (ref 850–3900)
Lymphocytes: 25 %
MCH: 30.4 pg (ref 27.0–33.0)
MCHC: 32.2 g/dL (ref 32.0–36.0)
MCV: 94.3 fL (ref 80.0–100.0)
MPV: 11.5 fL (ref 7.5–12.5)
Monocytes Absolute: 398 cells/uL (ref 200–950)
Monocytes: 7.1 %
Neutrophils Absolute: 3752 cells/uL (ref 1500–7800)
Neutrophils: 67 %
Platelets: 221 10*3/uL (ref 140–400)
RBC: 3.85 10*6/uL (ref 3.80–5.10)
RDW: 12.6 % (ref 11.0–15.0)
WBC: 5.6 10*3/uL (ref 3.8–10.8)

## 2020-11-22 LAB — TSH: TSH: 0.01 mIU/L — ABNORMAL LOW (ref 0.40–4.50)

## 2020-11-24 ENCOUNTER — Other Ambulatory Visit (INDEPENDENT_AMBULATORY_CARE_PROVIDER_SITE_OTHER): Payer: Self-pay | Admitting: Cardiovascular Disease

## 2020-11-24 ENCOUNTER — Other Ambulatory Visit (INDEPENDENT_AMBULATORY_CARE_PROVIDER_SITE_OTHER): Payer: Self-pay

## 2020-11-24 DIAGNOSIS — I429 Cardiomyopathy, unspecified: Secondary | ICD-10-CM

## 2020-11-24 DIAGNOSIS — I10 Essential (primary) hypertension: Secondary | ICD-10-CM

## 2020-11-24 NOTE — Progress Notes (Signed)
Erroneus encounter

## 2020-11-29 ENCOUNTER — Telehealth (INDEPENDENT_AMBULATORY_CARE_PROVIDER_SITE_OTHER): Payer: Self-pay

## 2020-11-29 NOTE — Telephone Encounter (Signed)
Received call from pt asking for T4 and TSH lab orders be faxed to PCP office as she will have them done there. Obtained fax number [phone] from office, printed lab orders and faxed to this number.

## 2021-01-15 NOTE — Progress Notes (Signed)
[facility] OFFICE  [address] [place], [place] [address]     Leslie Gross    Date of Visit:  08/14/2017  Date of Birth: Apr 02, 1951  Age: 70 yrs.   Medical Record Number: [id number]  __  CURRENT DIAGNOSES     1. 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 Cardiomyopathy non-ischemic unspecified, I42.9  3. Chest pain, unspecified, R07.9  4. Hypertension (essential or benign or malignant), I10  5. Palpitations, R00.2  __   ALLERGIES    Diflucan, Intolerance-diarrhea  Diflucan, Vomiting  Penicillins, Abdominal discomfort  Shellfish Derived, Angioedema  Shellfish  Derived, Intolerance-diarrhea  Shellfish Derived, Vomiting  __  MEDICATIONS     1. acetaminophen  300 mg-codeine 30 mg tablet, 1 po every 4 hours prn  2. Alrex 0.2 % eye drops,suspension, as directed  3. amitriptyline 25 mg tablet, 1 po qhs  4. amoxicillin 500 mg tablet, 4 po prior to dental appointments  5. aspirin 81 mg tablet,delayed  release, 1 po qd  6. Calcium 500 + D 500 mg (1,250 mg)-200 unit tablet, 1 po qd to bid prn  7. Claritin 10 mg tablet, 1 po qd prn  8. Coreg CR 10 mg capsule, extended release, 1 po qd  9. cyanocobalamin (vitamin B-12) 2,500 mcg tablet,  1 po three times per week  10. diazepam 2 mg tablet, 1 to 3 po prn  11. levothyroxine 100 mcg tablet, 1 po qd  12. Lexapro 10 mg tablet, 1 po qd  13. Lialda 1.2 gram tablet,delayed release, 4 po qd  14. lisinopril 5 mg tablet, 1 po  qd  15. multivitamin tablet, 1 po qd  16. Pepcid AC 10 mg tablet, 1 po bid prn  17. PreserVision AREDS 2 250 mg-200 unit-40 mg-1 mg capsule, 2 po qd  18. Restasis MultiDose 0.05 % eye drops, as directed  19. Vitamin D3 5,000 unit tablet,  1 po qd  20. vitamin E 400 unit capsule, 1 po qd  21. Xalatan 0.005 % eye drops, Take as Directed  __  CHIEF COMPLAINT/REASON FOR VISIT   Followup of Cardiomyopathy non-ischemic unspecified, Followup of Mitral valve insufficiency nonrheumatic and Hypertension  __  HISTORY OF PRESENT ILLNESS   Leslie Gross  is a 25 -year-old female who presents today for concerns about elevated blood pressure. The patient states last week she had a strange episode. She vomited her amitriptyline, then felt weakness in both arms and legs. At that time she checked  her blood pressure, which was 190s/110s. Since the, she has been checking her blood pressure about 3x per day. Last week, it ranged in the 140s-150s, which was associated with more headaches.     The patient denies any chest pain, pressure, shortness  of breath, dyspnea on exertion, palpitations, presyncope or syncope. The patient also denies any peripheral edema. She used to be on lisinopril years ago, however, she was taken off due to dizziness and possible hypotension. Her cuff today in the office  seems slightly inaccurate. It's running a bit high, about 10 points above my manual reading. She denies any changes to her diet or exercise regimen. Her husband is here with her today, and also expresses concerns about her blood pressure being slightly  elevated.   __  PAST HISTORY     Past Medical Illnesses : Ulcerative colitis, Glaucoma, Osteoporosis, Asthma, Migraines;  Past Cardiac Illnesses: Murmur, Mitral  valve regurgitation, Cardiomegaly; Infectious Diseases: No previous history of significant infectious diseases.;  Surgical Procedures:  Cholecystectomy, Knee replacement, Right shoulder replacement, Right rotator cuff tear repair, Pancreatic and CBD surgery, EGD with dilitation, DNC, Bladder biopsy, Gastric bypass, Lung  biopsy, Glaucoma, Breast reduction (bilateral), C-section x2, T, Repair of stomach strictures x3, Total reversal of shoulder joint replacement; Trauma History : No previous history of significant trauma.; Cardiology Procedures-Invasive: Cardiac Cath; Cardiology  Procedures-Noninvasive: Echocardiogram July 2008, Holter Monitor June 2018, MPI Single Isotope Lexiscan June 2018; Left Ventricular Ejection Fraction : LVEF of 61% documented via nuclear study on  03/31/2017  ___  FAMILY HISTORY  Father --  Myocardial infarction, CVA, Diabetes mellitus  Mother -- Cancer  PaternalGrandparent --  Heart disease  PaternalGrandparent -- Heart disease, Diabetes mellitus, Cancer    __  CARDIAC RISK FACTORS      Tobacco Abuse: has never used tobacco; Family History of Heart Disease: positive;  Hyperlipidemia: negative; Hypertension: negative;   Diabetes Mellitus: negative; Prior History of Heart Disease: negative;  Obesity: negative; Sedentary Life Style:negative;  [id number]; Menopausal:biological menopause  __   SOCIAL HISTORY    Alcohol Use: Does not use alcohol;  Smoking: Does not smoke; Never smoker ([id number]); Diet: Regular diet and Caffeine use-1-2 per day;  Exercise: Exercises occasionally;   __  PHYSICAL EXAMINATION    Vital Signs:   Blood Pressure:  130/80 Sitting, Right arm, large cuff  134/84 Sitting, Left arm, large cuff  138/80 Retaken by PA - manual Lt arm  149/89 Home cuff - Lt wrist     Weight: 168.00 lbs.  Height: 64.00"  BMI:  0   Pulse: 66/min. Apical   Respirations:  18/min.       Constitutional: cooperative, alert, oriented, in no acute distress Skin:  warm and dry to touch, no apparent skin lesions Head: normocephalic Eyes : conjunctivae and lids normal ENT: No pallor or cyanosis Neck : no JVD, no bruits Chest: clear to auscultation bilaterally, no use of accessory muscles, normal respiratory effort  Cardiac: Regular rhythm, no murmurs, gallops or rubs detected Peripheral Pulses: pulses full and equal  in all extremities Extremities/Back: no edema present, no deformities noted Psychiatric : normal memory Neurological: affect appropriate   __    Medications added today by the  physician:  lisinopril 5 mg tablet, 1 po qd, 30    IMPRESSIONS:   1. Hypertension - BP slightly elevated in the office. Home BP cuff seems to run high.  2. Normal Lexiscan on 03/31/17.    3. Palpitations and prior history of one episode of atrial fibrillation around the time of gastric    bypass surgery.   4. Overweight.  5. Fatigue.    RECOMMENDATIONS:  1. Start lisinopril 5mg  once daily.  2. Buy new BP cuff,  check BP daily, and keep log.  3. Check BMP to assess electrolytes and renal function in 10 days.  4. Follow up with myself to reassess blood pressure, bringing home cuff and log, in 2 weeks.   She may call the office sooner if any problems  arise.     Christene Slates. Orvan Falconer, PA-C    ARC/ds    cc: Lana Fish MD    EL    ____________________________  TODAYS ORDERS  Basic Metabolic Panel 10 days  Return Visit with NP/PA 2 weeks - with ACPA  12 Lead ECG Today

## 2021-01-15 NOTE — Progress Notes (Signed)
[facility] OFFICE  [address] [place], [place] [address]     Leslie Gross    Date of Visit:  05/13/2018  Date of Birth: [birth year]  Age: 70 yrs.   Medical Record Number: [id number]  __  CURRENT DIAGNOSES     1. Hypertension (essential or benign  or malignant), I10  2. Mitral valve insufficiency nonrheumatic, I34.0  3. Cardiomyopathy non-ischemic unspecified, I42.9  4. Chest pain, unspecified, R07.9  5. Preoperative cardiovascular exam, Z01.810  6. Palpitations, R00.2   7. Cardiomyopathy dilated, I42.0  __  ALLERGIES    Diflucan, Intolerance-diarrhea  Diflucan,  Vomiting  Penicillins, Abdominal discomfort  Shellfish Derived, Angioedema  Shellfish Derived, Intolerance-diarrhea  Shellfish Derived, Vomiting  __   MEDICATIONS     1. amitriptyline 25 mg tablet, 1 po qhs  2. Amlodipine 2.5 Mg Tablet, 1 po qd  3. amoxicillin 500 mg tablet, 4 po prior to dental appointments  4. aspirin 81 mg tablet,delayed  release, 1 po qd  5. Calcium 500 + D 500 mg (1,250 mg)-200 unit tablet, 1 po qd to bid prn  6. Claritin 10 mg tablet, 1 po qd prn  7. Coreg CR 10 mg capsule, extended release, 1 po qd  8. cyanocobalamin (vitamin B-12) 2,500 mcg tablet,  1 po three times per week  9. diazepam 2 mg tablet, 1 to 3 po prn  10. levothyroxine 112 mcg tablet, 1 po qd  11. Lexapro 10 mg tablet, 1 po qd  12. Lialda 1.2 gram tablet,delayed release, 4 po qd  13. Lisinopril 5 Mg Tablet, 1 po  qd  14. Pepcid AC 10 mg tablet, 1 po bid prn  15. prednisone 20 mg tablet, 1 po bid and as directed for taper  16. PreserVision AREDS 2 250 mg-200 unit-40 mg-1 mg capsule, 2 po qd  17. Restasis MultiDose 0.05 % eye drops, as directed   18. Vitamin D3 5,000 unit tablet, 1 po qd  19. vitamin E 400 unit capsule, 1 po qd  20. Xalatan 0.005 % eye drops, Take as Directed  __  CHIEF COMPLAINT/REASON FOR VISIT   Followup of cardiomyopathy  __  HISTORY OF PRESENT ILLNESS  Leslie Gross has been diagnosed apparently with polymyalgia rheumatica and the  diagnosis was  made yesterday. She could hardly stand or walk when the diagnosis was made but she has had a miraculous benefit from the Prednisone and is able to ambulate today although with some difficulty. Her weakness is definitely better. She is planning to have  colonoscopy in September because of worsening ulcerative colitis symptoms including some bleeding at times. She wanted a preoperative evaluation today and I told her that from a cardiovascular standpoint I did not seen anything holding her back. However,  we discussed that she is due for an echo to check her LV function because of her prior cardiomyopathy and we will try to get that accomplished before the colonoscopy. It is too early to do her preop ECG. The colonoscopy is going to be in September.   __  PHYSICAL EXAMINATION    Vital Signs:   Blood Pressure:  98/58 Sitting, Left arm, large cuff    Weight: 156.00 lbs.   Height: 64.00"  BMI: 26.77   Pulse:  78/min. Apical Regular       Constitutional: cooperative, alert, oriented, in no acute distress  Skin: warm and dry to touch, no apparent skin lesions Head: normocephalic, normal female hair pattern  Eyes: conjunctivae and lids normal ENT: No  pallor or cyanosis  Neck: no JVD, no bruits Chest: clear to auscultation bilaterally, no use of accessory muscles, normal  respiratory effort Cardiac: Regular rhythm, no murmurs, gallops or rubs detected Abdomen : abdomen soft Peripheral Pulses: pulses full and equal in all extremities Extremities/Back : no edema present, no deformities noted Psychiatric: normal memory Neurological : affect appropriate   __    Medications added today by the physician:       IMPRESSION:  1. Preoperative cardiovascular evaluation prior to colonoscopy which is considered a low risk   procedure from a cardiac perspective per our guidelines. She has been less active recently    but in general she is able to go 4 mEq without cardiac type symptoms. We should be able   to reassess by ECG  within a few weeks certainly and also she can tell us about new   symptoms as she becomes more active with her steroids.  2. New diagnosis  apparently of polymyalgia rheumatic - responsive so far to steroids.  3. Apparent history of glaucoma and she understands the importance of involving her   ophthalmologist given the steroid use.  4. Ulcerative colitis with bleeding - she will  stop her aspirin.  5. History of prior cardiomyopathy with recovery of LV function - she is due for an echo to recheck   her LV function.  6. Unremarkable Lexiscan NPI about a year ago.  7. Palpitations and apparently 1 episode of atrial  fibrillation with poor documentation, and it   occurred around the time of gastric bypass surgery years ago - not recurrent.  8. Overweight.  9. Fatigue.    RECOMMENDATION:    1. Continue current program.  2. Stop aspirin.  3. Proceed with colonoscopy but she is due for an ECG and an echo which we will perform pre op.  4. I will see her again perhaps in 6 months or PRN.     Harvel Quale, M.D. [facility]    PRN:lo    cc: NIDHI GILL MD    ____________________________  TODAYS ORDERS  2D, color flow, doppler 1 week  ZO:XWRUEAV Education ICD-10: I10 MedlinePlus Connect results for ICD-10 I10  Diet mgmt edu, guidance and counseling TODAY  12 Lead ECG 1 month  Return  Visit 15 MIN 6 months
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
## 2021-01-15 NOTE — Progress Notes (Signed)
[facility] OFFICE  [address] [place], [place] [address]     Leslie Gross    Date of Visit:  09/16/2017  Date of Birth: [birth year]  Age: 70 yrs.   Medical Record Number: [id number]  Referring Physician: Nelsy Madonna PA, Tyreque Finken  __   CURRENT DIAGNOSES     1. Hypertension (essential or benign or malignant), I10  2. Mitral valve insufficiency nonrheumatic, I34.0  3. Cardiomyopathy non-ischemic unspecified, I42.9  4.  Chest pain, unspecified, R07.9  5. Palpitations, R00.2  __  ALLERGIES    Diflucan, Intolerance-diarrhea   Diflucan, Vomiting  Penicillins, Abdominal discomfort  Shellfish Derived, Angioedema  Shellfish Derived, Intolerance-diarrhea  Shellfish Derived, Vomiting  __   MEDICATIONS     1. acetaminophen 300 mg-codeine 30 mg tablet, 1 po every 4 hours prn  2. Alrex 0.2 % eye drops,suspension, as directed  3. amitriptyline 25 mg tablet, 1 po qhs  4. amoxicillin  500 mg tablet, 4 po prior to dental appointments  5. aspirin 81 mg tablet,delayed release, 1 po qd  6. Calcium 500 + D 500 mg (1,250 mg)-200 unit tablet, 1 po qd to bid prn  7. Claritin 10 mg tablet, 1 po qd prn  8. Coreg CR 10 mg capsule,  extended release, 1 po qd  9. cyanocobalamin (vitamin B-12) 2,500 mcg tablet, 1 po three times per week  10. diazepam 2 mg tablet, 1 to 3 po prn  11. levothyroxine 100 mcg tablet, 1 po qd  12. Lexapro 10 mg tablet, 1 po qd  13. Lialda  1.2 gram tablet,delayed release, 4 po qd  14. lisinopril 10 mg tablet, 1 po qd  15. multivitamin tablet, 1 po qd  16. Pepcid AC 10 mg tablet, 1 po bid prn  17. PreserVision AREDS 2 250 mg-200 unit-40 mg-1 mg capsule, 2 po qd  18. Restasis  MultiDose 0.05 % eye drops, as directed  19. Vitamin D3 5,000 unit tablet, 1 po qd  20. vitamin E 400 unit capsule, 1 po qd  21. Xalatan 0.005 % eye drops, Take as Directed  __  CHIEF  COMPLAINT/REASON FOR VISIT  Followup of Hypertension (essential or benign or malignant)  __  HISTORY OF PRESENT ILLNESS   Leslie Gross is a pleasant  70 year old female who presents today with her husband for blood pressure followup. The patient has been taking lisinopril daily since she was seen about a month ago. She states that overall she is tolerating the medication  well. She had slight dizziness for the first couple of days after starting it; however, this resolved after that. She denies any chest pain, pressure, shortness of breath, palpitations, dyspnea on exertion, dizziness since the first two days of taking  lisinopril, or syncope. She reports that her blood pressure has been "all over the place." She bought a new blood pressure cuff as requested; however, she forgot to bring it in today. Her blood pressure results at home have been quite varied, ranging  from the 110s systolic to up to 200 one day. The patient has noticed slight headaches that occur first thing in the morning and sometimes wake her up. She denies any snoring or daytime fatigue, however. She wonders if her medication needs to be increased.  She does report high amounts of stress recently, especially with the holidays coming.   __  PAST HISTORY      Past Medical Illnesses: Ulcerative colitis, Glaucoma, Osteoporosis, Asthma, Migraines;  Past Cardiac  Illnesses: Murmur, Mitral valve regurgitation, Cardiomegaly;  Infectious Diseases: No previous history of  significant infectious diseases.; Surgical Procedures: Cholecystectomy, Knee replacement, Right shoulder replacement, Right rotator cuff tear repair, Pancreatic  and CBD surgery, EGD with dilitation, DNC, Bladder biopsy, Gastric bypass, Lung biopsy, Glaucoma, Breast reduction (bilateral), C-section x2, T, Repair of stomach strictures x3, Total reversal of shoulder joint replacement;  Trauma History: No previous history of significant trauma.; Cardiology Procedures-Invasive: Cardiac Cath;  Cardiology Procedures-Noninvasive: Echocardiogram July 2008, Holter Monitor June 2018, MPI Single Isotope Lexiscan June 2018; Left Ventricular  Ejection  Fraction: LVEF of 61% documented via nuclear study on 03/31/2017  ___  FAMILY HISTORY   Father -- Myocardial infarction, CVA, Diabetes mellitus  Mother -- Cancer   PaternalGrandparent -- Heart disease  PaternalGrandparent -- Heart disease, Diabetes mellitus, Cancer     __  CARDIAC RISK FACTORS     Tobacco Abuse: has never used tobacco; Family History of Heart  Disease: positive; Hyperlipidemia: negative; Hypertension : negative;  Diabetes Mellitus: negative; Prior History of Heart Disease : negative; Obesity: negative; Sedentary Life Style :negative; [id number]; Menopausal:biological  menopause  __  SOCIAL HISTORY    Alcohol  Use: Does not use alcohol; Smoking: Does not smoke; Never smoker ([id number]);  Diet: Regular diet and Caffeine use-1-2 per day; Exercise: Exercises occasionally;   __   PHYSICAL EXAMINATION    Vital Signs:  Blood Pressure:  126/82 Sitting, Right arm, regular  cuff  126/80 Sitting, Left arm, regular cuff    Weight: 169.80 lbs.  Height:  64.00"  BMI: 29   Pulse: 84/min. Apical Regular        Constitutional: cooperative, alert, oriented, in no acute distress Skin: warm and dry to touch, no  apparent skin lesions Head: normocephalic, normal female hair pattern Eyes : conjunctivae and lids normal ENT: No pallor or cyanosis Neck : no JVD, no bruits Chest: clear to auscultation bilaterally, no use of accessory muscles, normal respiratory effort  Cardiac: Regular rhythm, no murmurs, gallops or rubs detected Peripheral Pulses: pulses full and equal  in all extremities Extremities/Back: no edema present, no deformities noted Psychiatric : normal memory Neurological: affect appropriate   __    Medications added today by the  physician:  Lisinopril 10mg  tablet, 1 po qd #90, 2 refills     IMPRESSIONS:   1. Hypertension - borderline control.   2. Normal Lexiscan on 03/31/17.   3. Palpitations and prior history  of one episode of atrial fibrillation around the time of gastric   bypass surgery.   4.  Overweight.  5. Fatigue.    RECOMMENDATIONS:  1. Increase lisinopril to 10mg  once daily.  2. Check accuracy of new home cuff at physcial  therapy tomorrow.  3. Follow up with myself to reassess blood pressure, bringing home cuff and log, in 6 weeks.   She may call the office sooner if any problems arise.    Christene Slates. Orvan Falconer, PA-C    ARC/tutlc      cc: Briant Cedar PA  NIDHI GILL MD    el   ____________________________  [name]  Return Visit with NP/PA 6 weeks

## 2021-01-15 NOTE — Progress Notes (Signed)
[facility] OFFICE   [address] [place], [place] [address]     Leslie Gross    Date of Visit:  12/30/2018  Date of Birth: 12/29/1950  Age: 70 yrs.   Medical Record Number: [id number]  __  CURRENT DIAGNOSES     1. Hypertension (essential or benign  or malignant), I10  2. Mitral valve insufficiency nonrheumatic, I34.0  3. Cardiomyopathy non-ischemic unspecified, I42.9  4. Chest pain, unspecified, R07.9  5. Preoperative cardiovascular exam, Z01.810  6. Palpitations, R00.2   7. Cardiomyopathy dilated, I42.0  __  ALLERGIES    Diflucan, Intolerance-diarrhea  Diflucan,  Vomiting  Penicillins, Abdominal discomfort  Shellfish Derived, Angioedema  Shellfish Derived, Intolerance-diarrhea  Shellfish Derived, Vomiting  __   MEDICATIONS     1. Lialda 1.2 gram tablet,delayed release, 4 po qd  2. Restasis MultiDose 0.05 % eye drops, as directed  3. amitriptyline 25 mg tablet, 1 po qhs  4. amoxicillin 500 mg  tablet, 4 po prior to dental appointments  5. diazepam 2 mg tablet, 1 to 3 po prn  6. PreserVision AREDS 2 250 mg-200 unit-40 mg-1 mg capsule, 2 po qd  7. cyanocobalamin (vitamin B-12) 2,500 mcg tablet, 1 po three times per week  8. Claritin  10 mg tablet, 1 po qd prn  9. Pepcid AC 10 mg tablet, 1 po bid prn  10. vitamin E 400 unit capsule, 1 po qd  11. Xalatan 0.005 % eye drops, Take as Directed  12. levothyroxine 112 mcg tablet, 1 po qd  13. Lexapro 20 mg tablet, 1 po  qd  14. prednisone 5 mg tablet, 1 po bid and as directed for taper  15. Vitamin D2 1,250 mcg (50,000 unit) capsule, 1 po q weekly  16. sulfasalazine 500 mg tablet, 2 po bid  17. melatonin 5 mg capsule, 1 po qhs  18. amlodipine 2.5  mg tablet, 1 po qd  19. Coreg CR 10 mg capsule, extended release, 1 po qd  20. losartan 25 mg tablet, 1 po qd  __  CHIEF COMPLAINT/REASON FOR VISIT   Followup of htn  __  HISTORY OF PRESENT ILLNESS  I had an 11-minute telephone visit at 9:48 a.m. today with Leslie Gross, in lieu of her office  visit. She overall has  been feeling well and she believes her blood pressure is normal. She went to the hospital for vertigo back in December and since then has seen her internist, Leslie Gross, M.D. in her office. She has had a dry tickle in the back  of her throat. She asked me whether perhaps the ProAir could be used but I told her to let Dr. Doyne Gross know about that and she could refill that. She does have a history of asthma. She has not had any fevers, chills, or dyspnea, however. She has occasional  mild orthostasis but no falls and her vertigo has left her. We discussed that lisinopril could be involved in the cough and the tickle and we certainly could substitute losartan for that and she is willing.   __   PHYSICAL EXAMINATION    Vital Signs:  Blood Pressure:         ENT: voice on phone was normal   __    Medications added today by the physician:  amlodipine 2.5 mg tablet, 1 po qd, 90  Coreg CR 10 mg capsule, extended release, 1 po qd, 90  losartan  25 mg tablet, 1 po qd, 90  IMPRESSIONS:  1. Hypertension - apparently well controlled elsewhere and she feels really that it was fine during her admission for vertigo last December.   2. Recent vertigo which now has resolved. She  had a neurologic evaluation during her hospital stay in December.   3. History of polymyalgia rheumatica for which sulfasalazine was added since I last saw her with improvement.   4. Prior cardiomyopathy remotely seen only by transesophageal echocardiogram  with Leslie Gross, M.D. years ago. She had recovery of left ventricular function and probably mainly due to control of her blood pressure.   5. Unremarkable Lexiscan myocardial perfusion imaging study in 2018.   6. Prior palpitation which have  resolved.   7. Prior gastric bypass surgery remotely.   8. Apparent single episode of paroxysmal atrial fibrillation years ago when she was heavier and before gastric bypass surgery. She was taken off of her oral anticoagulant when she lost her  weight and  with shared decision-making, a decision was made not to restart the anticoagulant. We discussed that aspirin does not have any benefit regarding stroke risk reduction from atrial fibrillation and she is interested in not being on aspirin.      RECOMMENDATIONS:  1. Discontinue lisinopril.   2. Replace the lisinopril with losartan 25 mg daily. She will continue her other blood pressure medicines and I refilled all of those for her at her pharmacy.   3. She will follow up with Dr.  Doyne Gross within the next few months but report new symptoms to me in the interim if needed.   4. I will see her in a year or as needed.   5. She will have regular bloodwork through Dr. Doyne Gross.   6. Copy of the note to Dr. Doyne Gross.      Leslie Gross R. Leslie Ribas, MD, [facility], FASE     PRN/tuarg     cc: Leslie Mormon MD   ____________________________  Leslie Gross  Return  Visit 30 MIN 1 year

## 2021-01-15 NOTE — Progress Notes (Signed)
[facility] OFFICE  [address] [place], [place] [address]     Leslie Gross    Date of Visit:  10/30/2017  Date of Birth: 11-11-1950  Age: 70 yrs.   Medical Record Number: [id number]  Referring Physician: Carlis Blanchard PA, Damare Serano  __   CURRENT DIAGNOSES     1. Hypertension (essential or benign or malignant), I10  2. Mitral valve insufficiency nonrheumatic, I34.0  3. Cardiomyopathy non-ischemic unspecified, I42.9  4.  Chest pain, unspecified, R07.9  5. Palpitations, R00.2  __  ALLERGIES    Diflucan, Intolerance-diarrhea   Diflucan, Vomiting  Penicillins, Abdominal discomfort  Shellfish Derived, Angioedema  Shellfish Derived, Intolerance-diarrhea  Shellfish Derived, Vomiting  __   MEDICATIONS     1. amitriptyline 25 mg tablet, 1 po qhs  2. amoxicillin 500 mg tablet, 4 po prior to dental appointments  3. aspirin 81 mg tablet,delayed release, 1 po qd  4. Calcium 500  + D 500 mg (1,250 mg)-200 unit tablet, 1 po qd to bid prn  5. Claritin 10 mg tablet, 1 po qd prn  6. Coreg CR 10 mg capsule, extended release, 1 po qd  7. cyanocobalamin (vitamin B-12) 2,500 mcg tablet, 1 po three times per week  8. diazepam  2 mg tablet, 1 to 3 po prn  9. levothyroxine 100 mcg tablet, 1 po qd  10. Lexapro 10 mg tablet, 1 po qd  11. Lialda 1.2 gram tablet,delayed release, 4 po qd  12. lisinopril 10 mg tablet, 1 po qd  13. Pepcid AC 10 mg tablet, 1 po bid  prn  14. PreserVision AREDS 2 250 mg-200 unit-40 mg-1 mg capsule, 2 po qd  15. Restasis MultiDose 0.05 % eye drops, as directed  16. Vitamin D3 5,000 unit tablet, 1 po qd  17. vitamin E 400 unit capsule, 1 po qd  18. Xalatan 0.005  % eye drops, Take as Directed  __  CHIEF COMPLAINT/REASON FOR VISIT  Followup of Hypertension (essential or benign or malignant)  __   HISTORY OF PRESENT ILLNESS  Leslie Gross is a 37 -year-old female who presents today for a follow-up visit. The patient has been taking her blood pressure at home, however, it seems her home cuff is not  very  accurate. She checked it with physical therapist and her blood pressure has been ranging in the 150's/80's with her home cuff. Her blood pressure cuff seemed higher than what her physical therapist was getting. Today in the office she checked her  blood pressure with her cuff, and initially it was 159/70, then she checked it again a few minutes later and it was 132/69. 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 The patient is still waking up with headaches in the morning, but she has some neck issues with her therapist is trying to resolve and she is under a lot of stress. She denies any chest pain, pressure, shortness of breath, dyspnea  on exertion, peripheral edema, dizziness, or syncope. In the past with higher doses of metoprolol she had orthostatic hypotension so she is hesitant to increase the dose at this time. Her blood pressure today looks controlled, as for her age her goal  is less than 150 systolic.   __  PAST HISTORY     Past Medical Illnesses : Ulcerative colitis, Glaucoma, Osteoporosis, Asthma, Migraines;  Past Cardiac Illnesses: Murmur, Mitral  valve regurgitation, Cardiomegaly; Infectious Diseases:  No previous history of significant infectious diseases.;  Surgical Procedures: Cholecystectomy, Knee replacement, Right shoulder replacement, Right rotator cuff tear repair, Pancreatic and CBD surgery, EGD with dilitation, DNC, Bladder biopsy, Gastric bypass, Lung  biopsy, Glaucoma, Breast reduction (bilateral), C-section x2, T, Repair of stomach strictures x3, Total reversal of shoulder joint replacement; Trauma History : No previous history of significant trauma.; Cardiology Procedures-Invasive: Cardiac Cath; Cardiology  Procedures-Noninvasive: Echocardiogram July 2008, Holter Monitor June 2018, MPI Single Isotope Lexiscan June 2018; Left Ventricular Ejection Fraction : LVEF of 61% documented  via nuclear study on 03/31/2017  ___  FAMILY HISTORY  Father --  Myocardial infarction, CVA, Diabetes mellitus  Mother -- Cancer  PaternalGrandparent --  Heart disease  PaternalGrandparent -- Heart disease, Diabetes mellitus, Cancer    __  CARDIAC RISK FACTORS      Tobacco Abuse: has never used tobacco; Family History of Heart Disease: positive;  Hyperlipidemia: negative; Hypertension: negative;   Diabetes Mellitus: negative; Prior History of Heart Disease: negative;  Obesity: negative; Sedentary Life Style:negative;  [id number]; Menopausal:biological menopause  __   SOCIAL HISTORY    Alcohol Use: Does not use alcohol;  Smoking: Does not smoke; Never smoker ([id number]); Diet: Regular diet and Caffeine use-1-2 per day;  Exercise: Exercises occasionally;   __  PHYSICAL EXAMINATION    Vital Signs:   Blood Pressure:  130/70 Sitting, Right arm, regular cuff  126/70 Sitting, Left arm, regular cuff    Weight:  168.00 lbs.  Height: 64.00"  BMI: 29    Pulse: 72/min. Apical Regular       Constitutional: cooperative, alert, oriented, in no acute  distress Skin: warm and dry to touch, no apparent skin lesions Head : normocephalic, normal female hair pattern Eyes: conjunctivae and lids normal ENT : No pallor or cyanosis Neck: no JVD, no bruits Chest : clear to auscultation bilaterally, no use of accessory muscles, normal respiratory effort Cardiac: Regular rhythm, no murmurs, gallops or rubs detected  Peripheral Pulses: pulses full and equal in all extremities Extremities/Back: no edema present, no  deformities noted Psychiatric: normal memory Neurological : affect appropriate   __    Medications added today by the physician:    IMPRESSIONS:   1. Hypertension - borderline control.    2. Normal Lexiscan on 03/31/17.   3. Palpitations and prior history of one episode of atrial fibrillation around the time of gastric   bypass surgery.   4. Overweight.  5. Fatigue.    RECOMMENDATIONS:  1. Continue current medications.   2. Buy new  home BP cuff and check accuracy with her physical therapist.  3. Increase cardiovascular exercise to help with HTn and stress management.  4. Follow up with Dr. Tami Ribas in 2-3 months, sooner if needed.     Christene Slates. Orvan Falconer, PA-C     ARC/ds    cc: Lana Fish MD    el   ____________________________  [name]   GN:FAOZHYQ Education ICD-10: I10 MedlinePlus Connect results for ICD-10 I10  Basic Metabolic Panel Today  Return Visit 15 MIN 2-3 months - with PRN

## 2021-01-15 NOTE — Progress Notes (Signed)
[facility] OFFICE  [address] [place], [place] [address]     Ardelia Mems    Date of Visit:  03/18/2017  Date of Birth: 1951-03-03  Age: 70 yrs.   Medical Record Number: [id number]  __  CURRENT DIAGNOSES     1. 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 Cardiomyopathy non-ischemic unspecified, I42.9  3. Chest pain, unspecified, R07.9  4. Palpitations, R00.2  __  ALLERGIES     Diflucan, Intolerance-diarrhea  Diflucan, Vomiting  Penicillins, Abdominal discomfort  Shellfish Derived, Angioedema  Shellfish Derived, Intolerance-diarrhea  Shellfish Derived, Vomiting   __  MEDICATIONS     1. acetaminophen 300 mg-codeine 30 mg tablet, 1 po every 4 hours prn  2. Alrex 0.2 % eye drops,suspension, as directed   3. amitriptyline 25 mg tablet, 1 po qhs  4. amoxicillin 500 mg tablet, 4 po prior to dental appointments  5. aspirin 81 mg tablet,delayed release, 1 po qd  6. Calcium 500 + D 500 mg (1,250 mg)-200 unit tablet, 1 po qd to bid prn  7. Carafate  100 mg/mL oral suspension, 2 tbsp po bid  8. Claritin 10 mg tablet, 1 po qd prn  9. Coreg CR 10 mg capsule, extended release, 1 po qd  10. Creon 36,000 unit-114,000 unit-180,000 unit capsule,delayed release, 1 to 2 tid with meals  11.  Culturelle 10 billion cell capsule, 1 to 2 po qd prn  12. cyanocobalamin (vitamin B-12) 2,500 mcg tablet, 1 po three times per week  13. diazepam 2 mg tablet, 1 to 3 po prn  14. Imodium A-D 2 mg tablet, 1 to 2 po prn  15. iron dextran  100 mg/2 mL (50 mg/mL) injection solution, twice a year prn  16. levothyroxine 100 mcg tablet, 1 po qd  17. Lexapro 10 mg tablet, 1 po qd  18. Lialda 1.2 gram tablet,delayed release, 4 po qd  19. multivitamin tablet, 1 po qd  20. Pepcid  AC 10 mg tablet, 1 po bid prn  21. PreserVision AREDS 2 250 mg-200 unit-40 mg-1 mg capsule, 2 po qd  22. Restasis MultiDose 0.05 % eye drops, as directed  23. Simbrinza 1 %-0.2 % eye drops,suspension, as directed  24. Systane (propylene  glycol) 0.4 %-0.3 %  eye drops, as directed  25. Travatan Z 0.004 % eye drops, as directed  26. Tylenol 325 mg tablet, 2 po every 4 to 8 hours prn  27. Vitamin D3 5,000 unit tablet, 1 po qd  28. vitamin E 400 unit capsule, 1 po qd   __  CHIEF COMPLAINT/REASON FOR VISIT  Followup of cp  __  HISTORY OF PRESENT ILLNESS   Dorthea returns after about a year. In review, she is a very nice 70 year old woman who had a transient nonischemic cardiomyopathy about a decade ago, and cardiac cath did not show any coronary disease, but her EF was 40%. An echo had shown an EF of 35%.  She had recovery of her ejection fraction to normal in 2016 and since then she has been doing quiet well. She comes in for a visit a year after I saw her for another opinion, and over this past year she has noted some tightness in her chest at times.  She feels it is a crampy feeling in her chest when she is active, but she separately also has a muscular-type pain which is truly a cramp which grabs at her and tightens the left side of her chest and she feels that  is more under her breast. She occasionally  has tightness of her left arm and in the morning she feels that the arm is not normal and somewhat numb. She denies any shortness of breath, syncope, PND, orthopnea, pedal edema, nor has she had an increase in her usual palpitations, but she does feel  palpitations from time to time and is concerned about atrial fibrillation. She has a history of remote gastric bypass surgery and she did have atrial fibrillation periprocedure, but she has not been put on oral anticoagulants because of lack of documentation  of any other atrial fibrillation since then. She generally feels that her blood pressure is well controlled at home and it is much higher than usual here at 136-144 systolic.   __  PHYSICAL EXAMINATION     Vital Signs:  Blood Pressure:  144/74 Sitting, Left arm, regular cuff  136/74 Sitting, Right arm,  regular cuff    Weight: 165.00 lbs.  Height: 64.00"   BMI: 28    Pulse: 64/min. Apical        Constitutional: Well developed, well nourished, in no acute distress Skin: warm and dry to touch  Head: normocephalic Eyes: conjunctivae and lids normal  ENT: No pallor or cyanosis Neck:  no JVD, no bruits Chest: clear to auscultation bilaterally Cardiac : Regular rhythm, no murmurs, gallops or rubs detected Abdomen: abdomen soft Peripheral Pulses : pulses full and equal in all extremities Extremities/Back: right arm in a sling, no edema  Neurological: affect appropriate   __    Medications added today by the physician:   Coreg CR 10 mg capsule, extended release, 1 po qd, 90    IMPRESSIONS:   1. Chest pain suspicious for angina since it is exertional. Her normal coronary  arteries on cardiac   catheterization were a decade ago and a Eugenie Birks is over a year ago now, so we should repeat   the Lexiscan because of her recent symptoms.  2. Palpitations and prior history of one episode of atrial fibrillation around  the time of gastric   bypass surgery. We should do a Holter monitor to look for atrial fibrillation, and if she has   prolonged palpitations in the future she should have an electrocardiogram during her symptoms   to rule out atrial fibrillation.   3. Overweight.  4. Fatigue.    RECOMMENDATIONS:  1. Lexiscan nuclear testing, both for ejection fraction and because of her recent chest pain.  She   cannot walk far on the treadmill because of multiple orthopaedic problems in her history.  2. Return to Dr. Lana Fish about the left arm discomfort. She feels she may have some lipomas in   the arm that are contributing.   3. I  told her I would be happy to see her on a year to year basis, but if a Lexiscan is unremarkable   she will continue to work on her weight and eat regular fish, nuts, and olive oil in her diet.  4. Copy of the note to Dr. Lana Fish.     Heer Justiss R. Tami Ribas, MD, [facility], FASE    PRN/tutbh    cc: Lana Fish MD    el   ____________________________   [name]  ZO:XWRUEAV Education ICD-10: I42.9 MedlinePlus Connect results for ICD-10 I42.9  Diet mgmt edu, guidance and counseling TODAY  Lexiscan Infusion 1 week  Holter Monitor 1 week   Return Visit 15 MIN 1 year

## 2021-03-08 ENCOUNTER — Other Ambulatory Visit (INDEPENDENT_AMBULATORY_CARE_PROVIDER_SITE_OTHER): Payer: Self-pay

## 2021-03-08 DIAGNOSIS — I10 Essential (primary) hypertension: Secondary | ICD-10-CM

## 2021-03-08 MED ORDER — LOSARTAN POTASSIUM 25 MG PO TABS
25.0000 mg | ORAL_TABLET | Freq: Every day | ORAL | 2 refills | Status: DC
Start: 2021-03-08 — End: 2021-06-14

## 2021-06-14 ENCOUNTER — Other Ambulatory Visit (INDEPENDENT_AMBULATORY_CARE_PROVIDER_SITE_OTHER): Payer: Self-pay

## 2021-06-14 DIAGNOSIS — I420 Dilated cardiomyopathy: Secondary | ICD-10-CM

## 2021-06-14 DIAGNOSIS — I10 Essential (primary) hypertension: Secondary | ICD-10-CM

## 2021-06-14 MED ORDER — LOSARTAN POTASSIUM 25 MG PO TABS
25.0000 mg | ORAL_TABLET | Freq: Every day | ORAL | 1 refills | Status: DC
Start: 2021-06-14 — End: 2021-12-10

## 2021-06-14 MED ORDER — CARVEDILOL PHOSPHATE ER 10 MG PO CP24
10.0000 mg | ORAL_CAPSULE | Freq: Every morning | ORAL | 1 refills | Status: DC
Start: 2021-06-14 — End: 2021-12-28

## 2021-06-14 NOTE — Telephone Encounter (Signed)
Refill voicemail:    losartan (COZAAR) 25 MG tablet Take 1 tablet (25 mg total) by mouth daily      carvedilol (Coreg CR) 10 MG 24 hr capsule Take 1 capsule (10 mg total) by mouth every morning      Chart reviewed- patient saw Dr Roselle Locus in office on 11/14/2020. Medications sent to [id number] as requested.

## 2021-06-19 ENCOUNTER — Other Ambulatory Visit (INDEPENDENT_AMBULATORY_CARE_PROVIDER_SITE_OTHER): Payer: Self-pay

## 2021-10-03 ENCOUNTER — Other Ambulatory Visit: Payer: Self-pay | Admitting: Obstetrics & Gynecology

## 2021-10-25 NOTE — Progress Notes (Unsigned)
[facility] OFFICE PROGRESS NOTE    [facility] OFFICE -CARDIOLOGY  [address]  [place] [address]  Dept: [phone]  Dept Fax: [phone]       Patient Name: Leslie Gross    Date of Visit:  October 26, 2021  Date of Birth: 27-Jan-1951  AGE: 71 y.o.  Medical Record #: [id number]  Requesting Physician: Christel Mormon, MD      CHIEF COMPLAINT: Pre-op Exam      HISTORY OF PRESENT ILLNESS:    She is a pleasant 71 y.o. female who presents today for a preoperative cardiovascular risk assessment prior to a colonoscopy and endoscopy with Dr. Barbaraann Cao on 11/02/2021.   Patient has been doing well from a cardiac perspective.  [place] is going [place], so has been lifting and carrying heavy boxes around the house.  She walks around the house all day, and walks restores for 15 minutes or more.  She denies any chest discomfort or tightness or pressure or shortness of breath.  She is not on any anticoagulation, because in the past she has been told not to take aspirin due to her ulcerative colitis.      PAST MEDICAL HISTORY: She has a past medical history of Anemia, Arrhythmia (Dg 2008), Asthma, Blood transfusion without reported diagnosis (2012), Cardiomegaly, Depression, Difficulty in walking(719.7), Echocardiogram (04/2007, 06/2018), Essential (primary) hypertension (08/14/2017), Fracture (SEPT MID 2019), Fracture (06/2017), GERD (gastroesophageal reflux disease), Glaucoma (DX 2012), Headache, Holter monitor (03/2017), Hypothyroidism, Left rib fracture, Mitral valve prolapse, Mitral valve regurgitation, MPI - Lexiscan (03/2017), Murmur, Osteoporosis, Pancreatitis (2012), Polymyalgia rheumatica, Polymyositis, Pulmonary embolism, Shoulder pain (DX JULY 2019), Sleep apnea, and Ulcerative colitis, chronic (DX 1990). She has a past surgical history that includes Dilation and curettage of uterus (2011); Cystostomy w/ bladder biopsy  (08/2010); Esophagogastroduodenoscopy (2012 MARCH); Lung biopsy (Right, 1998); Ankle Fusion (Left, 1981); Knee surgery (Left, 1981); Cesarean section (1984, 1979); Shoulder muscle transfer (Right, 1972); Tonsillectomy (1956); Gastric bypass (2006); EGD, DILATION (N/A, 12/06/2016); Colonoscopy (N/A, 12/06/2016); Breast surgery (6270-3500); Joint replacement (2012); Fracture surgery; Cholecystectomy (2013); EGD, DILATION (BTN 2011 TRU 2018); D&C DIAGNOSTIC (2011); Cardiac catheterization (2009); Cosmetic surgery (1983); Rotator cuff repair (Right, 2014); EGD, COLONOSCOPY (N/A, 07/30/2018); Rotator cuff repair (Right); Pancreas surgery; and Reduction mammaplasty.    ALLERGIES:   Allergies   Allergen Reactions    Shellfish-Derived Products Angioedema, Diarrhea and Nausea And Vomiting    Fluconazole Diarrhea and Nausea And Vomiting    Penicillins Nausea And Vomiting and Abdominal Pain    Tape Other (See Comments)     Causes blisters       MEDICATIONS:   Patient's current medications were reviewed. ONLY Cardiac medications were updated unless others were addressed in assessment and plan.    Current Outpatient Medications:     acetaminophen (TYLENOL) 500 MG tablet, Take 1,000 mg by mouth 2 (two) times daily, Disp: , Rfl:     amitriptyline (ELAVIL) 25 MG tablet, Take 25 mg by mouth nightly.  , Disp: , Rfl:     Calcium Carbonate-Vitamin D (CALCIUM + D PO), Take 2 tablets by mouth Daily after lunch CALCIUM 500 MG  WITH VIT D 700IU  (Patient not taking: Reported on 10/26/2021), Disp: , Rfl:     carisoprodol (SOMA) 350 MG tablet, Take 350 mg by mouth 3 (three) times daily as needed (Patient not taking: Reported on 10/26/2021), Disp: , Rfl:     carvedilol (  Coreg CR) 10 MG 24 hr capsule, Take 1 capsule (10 mg total) by mouth every morning, Disp: 90 capsule, Rfl: 1    Cyanocobalamin (B-12) 2500 MCG Tab, Take by mouth, Disp: , Rfl:     cyclobenzaprine (FLEXERIL) 5 MG tablet, Take 1 tablet (5 mg total) by mouth 3 (three) times daily as  needed for Muscle spasms., Disp: 15 tablet, Rfl: 0    cycloSPORINE (RESTASIS) 0.05 % ophthalmic emulsion, Place 1 drop into both eyes as needed  , Disp: , Rfl:     diazePAM (VALIUM) 2 MG tablet, Take 1 tablet (2 mg total) by mouth every 12 (twelve) hours as needed for Anxiety, Disp: 30 tablet, Rfl: 0    escitalopram (LEXAPRO) 20 MG tablet, Take 20 mg by mouth every morning  , Disp: , Rfl:     famotidine (PEPCID) 20 MG tablet, Take 20 mg by mouth every 12 (twelve) hours, Disp: , Rfl:     Latanoprostene Bunod (VYZULTA) 0.024 % Solution, Apply 1 drop to eye nightly, Disp: , Rfl:     losartan (COZAAR) 25 MG tablet, Take 1 tablet (25 mg total) by mouth daily, Disp: 90 tablet, Rfl: 1    mesalamine (LIALDA) 1.2 G EC tablet, Take 4,800 mg by mouth every morning with breakfast  , Disp: , Rfl:     Multiple Vitamins-Minerals (PRESERVISION AREDS 2 PO), Take 2 tablets by mouth every evening  , Disp: , Rfl:     omeprazole (PRILOSEC) 20 MG capsule, Take 40 mg by mouth daily  , Disp: , Rfl:     sulfaSALAzine (AZULFIDINE) 500 MG EC tablet, Take 1 tablet by mouth 2 (two) times daily, Disp: , Rfl:     Synthroid 200 MCG tablet, Take 200 mcg by mouth daily, Disp: , Rfl:     vitamin D, ergocalciferol, (DRISDOL) 50000 UNIT Cap, Take 50,000 Units by mouth once a week EVERY MONDAY, Disp: , Rfl:      FAMILY HISTORY: family history includes Cancer in her mother and paternal grandparent; Coronary artery disease in her paternal grandfather and paternal grandmother; Diabetes in her father and paternal grandparent; Myocardial Infarction in her father; Stroke in her father.    SOCIAL HISTORY: She reports that she has never smoked. She has never used smokeless tobacco. She reports current alcohol use. She reports that she does not use drugs.    PHYSICAL EXAMINATION    Visit Vitals  BP 116/68 (BP Site: Right arm, Patient Position: Sitting, Cuff Size: Medium)   Pulse 71   Ht 1.626 m (5\' 4" )   Wt 74.4 kg (164 lb)   BMI 28.15 kg/m       General  Appearance:  Cooperative, alert and oriented, in no acute distress.    Skin: Warm and dry to touch, no apparent skin lesions.  Head: Normocephalic, normal hair pattern.   Eyes: conjunctivae and lids unremarkable, wearing glasses.  ENT: Wearing facemask. No pallor or cyanosis.  Neck: JVP normal, no carotid bruit.  Chest: Clear to auscultation bilaterally with good air movement and respiratory effort and no wheezes, rales, or rhonchi   Cardiovascular: Regular rhythm, S1 normal, S2 normal, No S3 or S4, Apical impulse not displaced, no murmurs, gallops or rubs detected   Abdomen: Soft, nontender, and nondistended.   Extremities: Warm.  No edema bilaterally. Radial pulses 2+ bilaterally.  Neuro: Alert and oriented x3. No gross motor or sensory deficits noted, affect appropriate. Normal memory.    BP Readings from Last 3 Encounters:  10/26/21 116/68   11/14/20 107/64   07/13/20 149/62     Wt Readings from Last 3 Encounters:   10/26/21 74.4 kg (164 lb)   11/14/20 74.8 kg (165 lb)   06/16/20 76.2 kg (168 lb)          ECG: Sinus rhythm with RBBB      LABS:   Lab Results   Component Value Date    WBC 5.6 11/22/2020    HGB 11.7 11/22/2020    HCT 36.3 11/22/2020    PLT 221 11/22/2020     Lab Results   Component Value Date    GLU 106 (H) 11/22/2020    BUN 13 11/22/2020    CREAT 0.48 (L) 11/22/2020    NA 143 11/22/2020    K 4.1 11/22/2020    CL 109 11/22/2020    CO2 27 11/22/2020    AST 36 (H) 11/22/2020    ALT 48 (H) 11/22/2020     Lab Results   Component Value Date    TSH 0.01 (L) 11/22/2020     Lab Results   Component Value Date    CHOL 206 (H) 11/22/2020    TRIG 86 11/22/2020    HDL 71 11/22/2020    LDL 116 (H) 11/22/2020           IMPRESSION:    Preoperative cardiovascular risk assessment prior to a colonoscopy with Dr. Barbaraann Cao on 11/02/2021.  New RBBB on ECG today.   Recovered cardiomyopathy seen by TEE with Dr. Nestor Lewandowsky years ago.  This has been attributed to poorly controlled blood pressure.  Her last echo  07/02/2018 showed normal LV function.  Coronary artery calcification seen on a chest CT 06/16/2020  A. Nonischemic SPECT MPI 2018.   Single episode of postoperative atrial fibrillation when she was heavier for gastric bypass surgery.  Palpitations have since resolved.  She has declined anticoagulation at that time.  Polymyalgia rheumatica history.  Hypertension, then hypotension, now normal on current regimen.  Hypothyroidism on supplementation.      RECOMMENDATIONS:   Discussed the case with Dr. Eben Burow who agrees that the patient has no cardiac contraindications for the planned procedure.   After her surgery, I recommended that she try starting 81 mg aspirin daily for her prior coronary artery calcifications and been seen.    Continue with current cardiac medications with no changes to be made today.    Consider ischemic evaluation at the next visit since we are approaching 5 years from her last test.                                                 Orders Placed This Encounter   Procedures    ECG 12 lead (Normal)       SIGNED:    Rosalia Hammers, PA          This note was generated by the Dragon speech recognition and may contain errors or omissions not intended by the user. Grammatical errors, random word insertions, deletions, pronoun errors, and incomplete sentences are occasional consequences of this technology due to software limitations. Not all errors are caught or corrected. If there are questions or concerns about the content of this note or information contained within the body of this dictation, they should be addressed directly with the author for clarification.

## 2021-10-26 ENCOUNTER — Ambulatory Visit (INDEPENDENT_AMBULATORY_CARE_PROVIDER_SITE_OTHER): Payer: Medicare Other | Admitting: Physician Assistant

## 2021-10-26 ENCOUNTER — Encounter (INDEPENDENT_AMBULATORY_CARE_PROVIDER_SITE_OTHER): Payer: Self-pay | Admitting: Physician Assistant

## 2021-10-26 VITALS — BP 116/68 | HR 71 | Ht 64.0 in | Wt 164.0 lb

## 2021-10-26 DIAGNOSIS — I428 Other cardiomyopathies: Secondary | ICD-10-CM

## 2021-10-26 DIAGNOSIS — I10 Essential (primary) hypertension: Secondary | ICD-10-CM

## 2021-10-26 DIAGNOSIS — I480 Paroxysmal atrial fibrillation: Secondary | ICD-10-CM

## 2021-10-26 DIAGNOSIS — I4510 Unspecified right bundle-branch block: Secondary | ICD-10-CM

## 2021-10-26 DIAGNOSIS — Z01810 Encounter for preprocedural cardiovascular examination: Secondary | ICD-10-CM

## 2021-10-26 DIAGNOSIS — I420 Dilated cardiomyopathy: Secondary | ICD-10-CM

## 2021-10-26 DIAGNOSIS — I2510 Atherosclerotic heart disease of native coronary artery without angina pectoris: Secondary | ICD-10-CM

## 2021-11-01 ENCOUNTER — Telehealth (INDEPENDENT_AMBULATORY_CARE_PROVIDER_SITE_OTHER): Payer: Self-pay

## 2021-11-01 NOTE — Telephone Encounter (Signed)
Faxed over EKG to [facility] in [place] [facility] for PT procedure. Faxed on # [phone].

## 2021-11-12 ENCOUNTER — Encounter (INDEPENDENT_AMBULATORY_CARE_PROVIDER_SITE_OTHER): Payer: Medicare Other | Admitting: Physician Assistant

## 2021-12-10 ENCOUNTER — Other Ambulatory Visit (INDEPENDENT_AMBULATORY_CARE_PROVIDER_SITE_OTHER): Payer: Self-pay

## 2021-12-10 DIAGNOSIS — I10 Essential (primary) hypertension: Secondary | ICD-10-CM

## 2021-12-10 MED ORDER — LOSARTAN POTASSIUM 25 MG PO TABS
25.0000 mg | ORAL_TABLET | Freq: Every day | ORAL | 3 refills | Status: DC
Start: 2021-12-10 — End: 2021-12-28

## 2021-12-28 ENCOUNTER — Telehealth (INDEPENDENT_AMBULATORY_CARE_PROVIDER_SITE_OTHER): Payer: Self-pay

## 2021-12-28 DIAGNOSIS — I420 Dilated cardiomyopathy: Secondary | ICD-10-CM

## 2021-12-28 DIAGNOSIS — I10 Essential (primary) hypertension: Secondary | ICD-10-CM

## 2021-12-28 MED ORDER — LOSARTAN POTASSIUM 25 MG PO TABS
25.0000 mg | ORAL_TABLET | Freq: Every day | ORAL | 1 refills | Status: AC
Start: 2021-12-28 — End: ?

## 2021-12-28 MED ORDER — CARVEDILOL PHOSPHATE ER 10 MG PO CP24
10.0000 mg | ORAL_CAPSULE | Freq: Every morning | ORAL | 1 refills | Status: AC
Start: 2021-12-28 — End: ?

## 2021-12-28 NOTE — Telephone Encounter (Signed)
Called pt. Advised they would need to find a new cardiologist in [place] for refills after 6 mo since they just moved there. She v/u. Verified pharm.

## 2022-03-01 DIAGNOSIS — I10 Essential (primary) hypertension: Secondary | ICD-10-CM | POA: Diagnosis present

## 2022-03-01 DIAGNOSIS — E785 Hyperlipidemia, unspecified: Secondary | ICD-10-CM | POA: Diagnosis present

## 2022-03-01 DIAGNOSIS — I480 Paroxysmal atrial fibrillation: Secondary | ICD-10-CM | POA: Insufficient documentation

## 2023-08-21 ENCOUNTER — Inpatient Hospital Stay
Admission: EM | Admit: 2023-08-21 | Discharge: 2023-08-24 | DRG: 871 | Disposition: A | Discharge status: Home / Self Care | Payer: Medicare Other | Attending: Student in an Organized Health Care Education/Training Program | Admitting: Student in an Organized Health Care Education/Training Program

## 2023-08-21 ENCOUNTER — Inpatient Hospital Stay: Payer: Medicare Other

## 2023-08-21 ENCOUNTER — Encounter: Payer: Self-pay | Admitting: Internal Medicine

## 2023-08-21 ENCOUNTER — Emergency Department: Payer: Medicare Other

## 2023-08-21 DIAGNOSIS — J189 Pneumonia, unspecified organism: Secondary | ICD-10-CM

## 2023-08-21 DIAGNOSIS — A4189 Other specified sepsis: Principal | ICD-10-CM | POA: Diagnosis present

## 2023-08-21 DIAGNOSIS — E039 Hypothyroidism, unspecified: Secondary | ICD-10-CM | POA: Diagnosis present

## 2023-08-21 DIAGNOSIS — E785 Hyperlipidemia, unspecified: Secondary | ICD-10-CM | POA: Diagnosis present

## 2023-08-21 DIAGNOSIS — I2510 Atherosclerotic heart disease of native coronary artery without angina pectoris: Secondary | ICD-10-CM | POA: Diagnosis present

## 2023-08-21 DIAGNOSIS — Z6830 Body mass index (BMI) 30.0-30.9, adult: Secondary | ICD-10-CM

## 2023-08-21 DIAGNOSIS — Z79899 Other long term (current) drug therapy: Secondary | ICD-10-CM

## 2023-08-21 DIAGNOSIS — J9601 Acute respiratory failure with hypoxia: Secondary | ICD-10-CM | POA: Diagnosis present

## 2023-08-21 DIAGNOSIS — I471 Supraventricular tachycardia, unspecified: Secondary | ICD-10-CM | POA: Insufficient documentation

## 2023-08-21 DIAGNOSIS — K219 Gastro-esophageal reflux disease without esophagitis: Secondary | ICD-10-CM | POA: Diagnosis present

## 2023-08-21 DIAGNOSIS — K5190 Ulcerative colitis, unspecified, without complications: Secondary | ICD-10-CM | POA: Diagnosis present

## 2023-08-21 DIAGNOSIS — I10 Essential (primary) hypertension: Secondary | ICD-10-CM | POA: Diagnosis present

## 2023-08-21 DIAGNOSIS — J206 Acute bronchitis due to rhinovirus: Secondary | ICD-10-CM | POA: Diagnosis present

## 2023-08-21 DIAGNOSIS — R7401 Elevation of levels of liver transaminase levels: Secondary | ICD-10-CM | POA: Diagnosis present

## 2023-08-21 DIAGNOSIS — F419 Anxiety disorder, unspecified: Secondary | ICD-10-CM | POA: Diagnosis present

## 2023-08-21 DIAGNOSIS — R0902 Hypoxemia: Principal | ICD-10-CM

## 2023-08-21 DIAGNOSIS — R519 Headache, unspecified: Secondary | ICD-10-CM

## 2023-08-21 DIAGNOSIS — J45901 Unspecified asthma with (acute) exacerbation: Secondary | ICD-10-CM | POA: Diagnosis present

## 2023-08-21 DIAGNOSIS — A419 Sepsis, unspecified organism: Principal | ICD-10-CM | POA: Diagnosis present

## 2023-08-21 DIAGNOSIS — F32A Depression, unspecified: Secondary | ICD-10-CM | POA: Diagnosis present

## 2023-08-21 DIAGNOSIS — M353 Polymyalgia rheumatica: Secondary | ICD-10-CM | POA: Diagnosis present

## 2023-08-21 DIAGNOSIS — R630 Anorexia: Secondary | ICD-10-CM | POA: Diagnosis present

## 2023-08-21 DIAGNOSIS — Z79890 Hormone replacement therapy (postmenopausal): Secondary | ICD-10-CM

## 2023-08-21 DIAGNOSIS — E66811 Obesity, class 1: Secondary | ICD-10-CM | POA: Diagnosis present

## 2023-08-21 DIAGNOSIS — Z86711 Personal history of pulmonary embolism: Secondary | ICD-10-CM

## 2023-08-21 HISTORY — DX: Iron deficiency anemia, unspecified: D50.9

## 2023-08-21 HISTORY — DX: Depression, unspecified: F32.A

## 2023-08-21 LAB — URINALYSIS WITH REFLEX TO MICROSCOPIC EXAM - REFLEX TO CULTURE
Urine Bilirubin: NEGATIVE
Urine Blood: NEGATIVE
Urine Glucose: NEGATIVE
Urine Ketones: NEGATIVE mg/dL
Urine Leukocyte Esterase: NEGATIVE
Urine Nitrite: NEGATIVE
Urine Protein: NEGATIVE
Urine Specific Gravity: >1.035 — ABNORMAL HIGH (ref 1.001–1.035)
Urine Urobilinogen: NORMAL mg/dL (ref 0.2–2.0)
Urine pH: 6 (ref 5.0–8.0)

## 2023-08-21 LAB — LAB USE ONLY - CBC WITH DIFFERENTIAL
Absolute Basophils: 0.04 10*3/uL (ref 0.00–0.08)
Absolute Eosinophils: 0.11 10*3/uL (ref 0.00–0.44)
Absolute Immature Granulocytes: 0.02 10*3/uL (ref 0.00–0.07)
Absolute Lymphocytes: 0.72 10*3/uL (ref 0.42–3.22)
Absolute Monocytes: 0.45 10*3/uL (ref 0.21–0.85)
Absolute Neutrophils: 6.68 10*3/uL — ABNORMAL HIGH (ref 1.10–6.33)
Absolute nRBC: 0 10*3/uL (ref <=0.00)
Basophils %: 0.5 %
Eosinophils %: 1.4 %
Hematocrit: 42.4 % (ref 34.7–43.7)
Hemoglobin: 13.6 g/dL (ref 11.4–14.8)
Immature Granulocytes %: 0.2 %
Lymphocytes %: 9 %
MCH: 31.8 pg (ref 25.1–33.5)
MCHC: 32.1 g/dL (ref 31.5–35.8)
MCV: 99.1 fL — ABNORMAL HIGH (ref 78.0–96.0)
MPV: 11.5 fL (ref 8.9–12.5)
Monocytes %: 5.6 %
Neutrophils %: 83.3 %
Platelet Count: 189 10*3/uL (ref 142–346)
Preliminary Absolute Neutrophil Count: 6.68 10*3/uL — ABNORMAL HIGH (ref 1.10–6.33)
RBC: 4.28 10*6/uL (ref 3.90–5.10)
RDW: 14 % (ref 11–15)
WBC: 8.02 10*3/uL (ref 3.10–9.50)
nRBC %: 0 /100{WBCs} (ref <=0.0)

## 2023-08-21 LAB — URINALYSIS WITH REFLEX TO MICROSCOPIC EXAM IF INDICATED
Urine Bilirubin: NEGATIVE
Urine Blood: NEGATIVE
Urine Glucose: NEGATIVE
Urine Ketones: NEGATIVE mg/dL
Urine Leukocyte Esterase: NEGATIVE
Urine Nitrite: NEGATIVE
Urine Protein: NEGATIVE
Urine Specific Gravity: >1.035 — ABNORMAL HIGH (ref 1.001–1.035)
Urine Urobilinogen: NORMAL mg/dL (ref 0.2–2.0)
Urine pH: 6 (ref 5.0–8.0)

## 2023-08-21 LAB — COMPREHENSIVE METABOLIC PANEL
ALT: 48 U/L (ref <=55)
AST (SGOT): 47 U/L — ABNORMAL HIGH (ref <=41)
Albumin/Globulin Ratio: 1.2 (ref 0.9–2.2)
Albumin: 3.6 g/dL (ref 3.5–5.0)
Alkaline Phosphatase: 121 U/L — ABNORMAL HIGH (ref 37–117)
Anion Gap: 11 (ref 5.0–15.0)
BUN: 13 mg/dL (ref 7–21)
Bilirubin, Total: 0.5 mg/dL (ref 0.2–1.2)
CO2: 28 meq/L (ref 17–29)
Calcium: 8.4 mg/dL (ref 7.9–10.2)
Chloride: 104 meq/L (ref 99–111)
Creatinine: 0.6 mg/dL (ref 0.4–1.0)
GFR: >60 mL/min/{1.73_m2} (ref >=60.0)
Globulin: 3 g/dL (ref 2.0–3.6)
Glucose: 109 mg/dL — ABNORMAL HIGH (ref 70–100)
Potassium: 3.8 meq/L (ref 3.5–5.3)
Protein, Total: 6.6 g/dL (ref 6.0–8.3)
Sodium: 143 meq/L (ref 135–145)

## 2023-08-21 LAB — MAGNESIUM: Magnesium: 1.8 mg/dL (ref 1.6–2.6)

## 2023-08-21 LAB — PHOSPHORUS: Phosphorus: 3.9 mg/dL (ref 2.3–4.7)

## 2023-08-21 LAB — LACTIC ACID: Whole Blood Lactic Acid: 1 mmol/L (ref 0.2–2.0)

## 2023-08-21 MED ORDER — ALBUTEROL-IPRATROPIUM 2.5-0.5 (3) MG/3ML IN SOLN
3.0000 mL | RESPIRATORY_TRACT | Status: DC | PRN
Start: 2023-08-21 — End: 2023-08-24
  Administered 2023-08-21: 3 mL via RESPIRATORY_TRACT
  Filled 2023-08-21: qty 3

## 2023-08-21 MED ORDER — IPRATROPIUM BROMIDE 0.02 % IN SOLN
0.5000 mg | Freq: Four times a day (QID) | RESPIRATORY_TRACT | Status: DC
Start: 2023-08-21 — End: 2023-08-22
  Administered 2023-08-21 – 2023-08-22 (×3): 0.5 mg via RESPIRATORY_TRACT
  Filled 2023-08-21 (×3): qty 2.5

## 2023-08-21 MED ORDER — DOCUSATE SODIUM 100 MG PO CAPS
100.0000 mg | ORAL_CAPSULE | Freq: Two times a day (BID) | ORAL | Status: AC
Start: 2023-08-21 — End: ?
  Administered 2023-08-22 – 2023-08-24 (×3): 100 mg via ORAL
  Filled 2023-08-21: qty 1

## 2023-08-21 MED ORDER — LOSARTAN POTASSIUM 25 MG PO TABS
25.0000 mg | ORAL_TABLET | Freq: Every day | ORAL | Status: AC
Start: 2023-08-21 — End: ?
  Administered 2023-08-22 – 2023-08-24 (×3): 25 mg via ORAL
  Filled 2023-08-21: qty 1

## 2023-08-21 MED ORDER — STERILE WATER FOR INJECTION IJ/IV SOLN (WRAP)
2.0000 g | INTRAMUSCULAR | Status: DC
Start: 2023-08-21 — End: 2023-08-21
  Administered 2023-08-21: 2 g via INTRAVENOUS
  Filled 2023-08-21: qty 2000

## 2023-08-21 MED ORDER — FOLIC ACID 1 MG PO TABS
1.0000 mg | ORAL_TABLET | Freq: Every day | ORAL | Status: DC
Start: 2023-08-21 — End: 2023-08-24
  Administered 2023-08-21 – 2023-08-24 (×4): 1 mg via ORAL
  Filled 2023-08-21: qty 1

## 2023-08-21 MED ORDER — MELATONIN 3 MG PO TABS
3.0000 mg | ORAL_TABLET | Freq: Every evening | ORAL | Status: AC | PRN
Start: 2023-08-21 — End: ?

## 2023-08-21 MED ORDER — PROCHLORPERAZINE EDISYLATE 10 MG/2ML IJ SOLN
10.0000 mg | INTRAMUSCULAR | Status: DC | PRN
Start: 2023-08-21 — End: 2023-08-24

## 2023-08-21 MED ORDER — PANTOPRAZOLE SODIUM 40 MG PO TBEC
40.0000 mg | DELAYED_RELEASE_TABLET | Freq: Every morning | ORAL | Status: AC
Start: 2023-08-21 — End: ?
  Administered 2023-08-21 – 2023-08-24 (×4): 40 mg via ORAL
  Filled 2023-08-21: qty 1

## 2023-08-21 MED ORDER — LEVOTHYROXINE SODIUM 100 MCG PO TABS
200.0000 ug | ORAL_TABLET | Freq: Every day | ORAL | Status: DC
Start: 2023-08-21 — End: 2023-08-21

## 2023-08-21 MED ORDER — PROCHLORPERAZINE EDISYLATE 10 MG/2ML IJ SOLN
10.0000 mg | Freq: Once | INTRAMUSCULAR | Status: AC
Start: 2023-08-21 — End: 2023-08-21
  Administered 2023-08-21: 10 mg via INTRAVENOUS
  Filled 2023-08-21: qty 2

## 2023-08-21 MED ORDER — LEVOTHYROXINE SODIUM 125 MCG PO TABS
125.0000 ug | ORAL_TABLET | Freq: Every day | ORAL | Status: DC
Start: 2023-08-22 — End: 2023-08-24
  Administered 2023-08-22 – 2023-08-24 (×3): 125 ug via ORAL
  Filled 2023-08-21: qty 1

## 2023-08-21 MED ORDER — KETOROLAC TROMETHAMINE 30 MG/ML IJ SOLN
15.0000 mg | Freq: Once | INTRAMUSCULAR | Status: AC
Start: 2023-08-21 — End: 2023-08-21
  Administered 2023-08-21: 15 mg via INTRAVENOUS
  Filled 2023-08-21: qty 1

## 2023-08-21 MED ORDER — ALUM & MAG HYDROXIDE-SIMETH 200-200-20 MG/5ML PO SUSP
15.0000 mL | ORAL | Status: DC | PRN
Start: 2023-08-21 — End: 2023-08-24

## 2023-08-21 MED ORDER — DIPHENHYDRAMINE HCL 50 MG/ML IJ SOLN
12.5000 mg | Freq: Once | INTRAMUSCULAR | Status: AC
Start: 2023-08-21 — End: 2023-08-21
  Administered 2023-08-21: 12.5 mg via INTRAVENOUS
  Filled 2023-08-21: qty 1

## 2023-08-21 MED ORDER — DIAZEPAM 2 MG PO TABS
2.0000 mg | ORAL_TABLET | Freq: Two times a day (BID) | ORAL | Status: DC | PRN
Start: 2023-08-21 — End: 2023-08-24

## 2023-08-21 MED ORDER — BUTALBITAL-APAP-CAFFEINE 50-325-40 MG PO TABS
1.0000 | ORAL_TABLET | ORAL | Status: DC | PRN
Start: 2023-08-21 — End: 2023-08-24
  Administered 2023-08-21 – 2023-08-22 (×3): 1 via ORAL

## 2023-08-21 MED ORDER — ENOXAPARIN SODIUM 40 MG/0.4ML IJ SOSY
40.0000 mg | PREFILLED_SYRINGE | Freq: Every day | INTRAMUSCULAR | Status: DC
Start: 2023-08-21 — End: 2023-08-24
  Administered 2023-08-21 – 2023-08-23 (×3): 40 mg via SUBCUTANEOUS

## 2023-08-21 MED ORDER — SULFASALAZINE 500 MG PO TABS
500.0000 mg | ORAL_TABLET | Freq: Two times a day (BID) | ORAL | Status: DC
Start: 2023-08-21 — End: 2023-08-21

## 2023-08-21 MED ORDER — MORPHINE SULFATE 2 MG/ML IJ/IV SOLN (WRAP)
2.0000 mg | Status: AC | PRN
Start: 2023-08-21 — End: 2023-08-23

## 2023-08-21 MED ORDER — ONDANSETRON HCL 4 MG/2ML IJ SOLN
4.0000 mg | INTRAMUSCULAR | Status: DC | PRN
Start: 2023-08-21 — End: 2023-08-24

## 2023-08-21 MED ORDER — IOHEXOL 350 MG/ML IV SOLN
100.0000 mL | Freq: Once | INTRAVENOUS | Status: AC | PRN
Start: 2023-08-21 — End: 2023-08-21
  Administered 2023-08-21: 100 mL via INTRAVENOUS

## 2023-08-21 MED ORDER — STERILE WATER FOR INJECTION IJ/IV SOLN (WRAP)
1.0000 g | INTRAMUSCULAR | Status: AC
Start: 2023-08-22 — End: 2023-08-27
  Administered 2023-08-22 – 2023-08-23 (×2): 1 g via INTRAVENOUS
  Filled 2023-08-21: qty 1000

## 2023-08-21 MED ORDER — AZITHROMYCIN 250 MG PO TABS
500.0000 mg | ORAL_TABLET | Freq: Every day | ORAL | Status: DC
Start: 2023-08-22 — End: 2023-08-27
  Administered 2023-08-22 – 2023-08-23 (×2): 500 mg via ORAL
  Filled 2023-08-21: qty 2

## 2023-08-21 MED ORDER — ACETAMINOPHEN 325 MG PO TABS
650.0000 mg | ORAL_TABLET | Freq: Once | ORAL | Status: AC
Start: 2023-08-21 — End: 2023-08-21
  Administered 2023-08-21: 650 mg via ORAL
  Filled 2023-08-21: qty 2

## 2023-08-21 MED ORDER — METHYLPREDNISOLONE SODIUM SUCC 40 MG IJ SOLR (WRAP)
40.0000 mg | Freq: Two times a day (BID) | INTRAMUSCULAR | Status: DC
Start: 2023-08-21 — End: 2023-08-23
  Administered 2023-08-21 – 2023-08-23 (×5): 40 mg via INTRAVENOUS
  Filled 2023-08-21 (×2): qty 1

## 2023-08-21 MED ORDER — AZITHROMYCIN 500 MG IN 250 ML NS IVPB VIAL-MATE (CNR)
500.0000 mg | INTRAVENOUS | Status: DC
Start: 2023-08-21 — End: 2023-08-21
  Administered 2023-08-21: 500 mg via INTRAVENOUS
  Filled 2023-08-21: qty 250

## 2023-08-21 MED ORDER — ALBUTEROL-IPRATROPIUM 2.5-0.5 (3) MG/3ML IN SOLN
3.0000 mL | Freq: Two times a day (BID) | RESPIRATORY_TRACT | Status: DC
Start: 2023-08-21 — End: 2023-08-21

## 2023-08-21 MED ORDER — RISAQUAD PO CAPS
1.0000 | ORAL_CAPSULE | Freq: Every day | ORAL | Status: DC
Start: 2023-08-21 — End: 2023-08-24
  Administered 2023-08-21 – 2023-08-24 (×4): 1 via ORAL
  Filled 2023-08-21: qty 1

## 2023-08-21 MED ORDER — ESCITALOPRAM OXALATE 20 MG PO TABS
20.0000 mg | ORAL_TABLET | Freq: Every morning | ORAL | Status: DC
Start: 2023-08-21 — End: 2023-08-24
  Administered 2023-08-21 – 2023-08-24 (×4): 20 mg via ORAL
  Filled 2023-08-21: qty 1

## 2023-08-21 MED ORDER — ALBUTEROL SULFATE 1.25 MG/3ML IN NEBU
1.2500 mg | INHALATION_SOLUTION | Freq: Four times a day (QID) | RESPIRATORY_TRACT | Status: DC
Start: 2023-08-21 — End: 2023-08-24
  Administered 2023-08-21 – 2023-08-24 (×12): 1.25 mg via RESPIRATORY_TRACT
  Filled 2023-08-21 (×10): qty 3

## 2023-08-21 MED ORDER — CALCIUM CARBONATE ANTACID 500 MG PO CHEW
1000.0000 mg | CHEWABLE_TABLET | Freq: Three times a day (TID) | ORAL | Status: AC | PRN
Start: 2023-08-21 — End: ?

## 2023-08-21 MED ORDER — SODIUM CHLORIDE 0.9 % IV BOLUS
1000.0000 mL | Freq: Once | INTRAVENOUS | Status: AC
Start: 2023-08-21 — End: 2023-08-21
  Administered 2023-08-21: 1000 mL via INTRAVENOUS

## 2023-08-21 MED ORDER — ATORVASTATIN CALCIUM 20 MG PO TABS
10.0000 mg | ORAL_TABLET | Freq: Every evening | ORAL | Status: DC
Start: 2023-08-21 — End: 2023-08-24
  Administered 2023-08-21 – 2023-08-23 (×3): 10 mg via ORAL

## 2023-08-21 MED ORDER — STERILE WATER FOR INJECTION IJ/IV SOLN (WRAP)
1.0000 g | INTRAMUSCULAR | Status: DC
Start: 2023-08-22 — End: 2023-08-21

## 2023-08-21 MED ORDER — OXYCODONE HCL 5 MG PO TABS
5.0000 mg | ORAL_TABLET | ORAL | Status: AC | PRN
Start: 2023-08-21 — End: ?
  Administered 2023-08-22 (×2): 5 mg via ORAL

## 2023-08-21 MED ORDER — BENZONATATE 100 MG PO CAPS
100.0000 mg | ORAL_CAPSULE | Freq: Three times a day (TID) | ORAL | Status: AC | PRN
Start: 2023-08-21 — End: ?
  Administered 2023-08-22 – 2023-08-23 (×3): 100 mg via ORAL
  Filled 2023-08-21: qty 1

## 2023-08-21 MED ORDER — VITAMINS/MINERALS PO TABS
1.0000 | ORAL_TABLET | Freq: Every day | ORAL | Status: DC
Start: 2023-08-22 — End: 2023-08-24
  Administered 2023-08-22 – 2023-08-24 (×3): 1 via ORAL
  Filled 2023-08-21: qty 1

## 2023-08-21 MED ORDER — AMITRIPTYLINE HCL 25 MG PO TABS
25.0000 mg | ORAL_TABLET | Freq: Every evening | ORAL | Status: DC
Start: 2023-08-21 — End: 2023-08-24
  Administered 2023-08-21 – 2023-08-23 (×3): 25 mg via ORAL
  Filled 2023-08-21 (×4): qty 1

## 2023-08-21 MED ORDER — GUAIFENESIN-CODEINE 100-10 MG/5ML PO SYRP
5.0000 mL | ORAL_SOLUTION | ORAL | Status: DC | PRN
Start: 2023-08-21 — End: 2023-08-24
  Administered 2023-08-22 – 2023-08-24 (×5): 5 mL via ORAL

## 2023-08-21 MED ORDER — MESALAMINE ER 250 MG PO CPCR: 4800.0000 mg | ORAL_CAPSULE | Freq: Every morning | ORAL | Status: DC

## 2023-08-21 MED ORDER — TRAZODONE HCL 50 MG PO TABS
50.0000 mg | ORAL_TABLET | Freq: Every evening | ORAL | Status: DC
Start: 2023-08-21 — End: 2023-08-24
  Administered 2023-08-21 – 2023-08-23 (×3): 50 mg via ORAL

## 2023-08-21 MED ORDER — ACETAMINOPHEN 325 MG PO TABS
650.0000 mg | ORAL_TABLET | ORAL | Status: DC | PRN
Start: 2023-08-21 — End: 2023-08-24
  Administered 2023-08-21: 650 mg via ORAL

## 2023-08-21 MED ORDER — MESALAMINE ER 250 MG PO CPCR
1000.0000 mg | ORAL_CAPSULE | Freq: Four times a day (QID) | ORAL | Status: DC
  Administered 2023-08-21 – 2023-08-24 (×10): 1000 mg via ORAL
  Filled 2023-08-21 (×15): qty 4

## 2023-08-21 MED ORDER — CARVEDILOL 3.125 MG PO TABS
3.1250 mg | ORAL_TABLET | Freq: Two times a day (BID) | ORAL | Status: AC
Start: 2023-08-21 — End: ?
  Administered 2023-08-21 – 2023-08-24 (×6): 3.125 mg via ORAL
  Filled 2023-08-21: qty 1

## 2023-08-21 NOTE — ED to IP RN Note (Signed)
 [facility] [place]  ED NURSING NOTE FOR THE RECEIVING INPATIENT NURSE   ED NURSE Tocarra Gassen RN   [facility] [phone]   ED CHARGE RN Lauren RN   ADMISSION INFORMATION   Leslie Gross is a 72 y.o. female admitted with an ED diagnosis of:    1. Sepsis due to pne

## 2023-08-21 NOTE — Progress Notes (Signed)
 Pt admitted onto unit from ED. Pt and family member - Son- oriented to room and surroundings, pt verbalizes understanding. Skin assessment and daily assessment completed. VSS  temp 99.9 and pt c/o headache and tylenol given- will continue to monitor. Call

## 2023-08-21 NOTE — Consults (Signed)
 INFECTIOUS DISEASE CONSULTATION NOTE    Date Time: 08/21/23 3:00 PM  Patient Name: Leslie Gross  Requesting Physician: Oneta Rack, DO     Reason for Consultation:   PNA?    History:   36F w/ PMH asthma (childhood), HLD, PMR.    Is from [place]

## 2023-08-21 NOTE — Transition of Care (Signed)
 4 eyes in 4 hours pressure injury assessment note:      Completed with: Gladys  Unit & Time admitted: 1600 [address]             Bony Prominences: Check appropriate box; if wound is present enter wound assessment in LDA     Occiput:                 [x] WNL  [

## 2023-08-21 NOTE — H&P (Signed)
 [facility]OSPITAL[facility] H&P Note  Patient Info:   Date/Time: 08/21/2023 / 2:24 PM   Admit Date:08/21/2023  Patient Name:Leslie Gross   [id number]:[id number]   PCP: Pcp, None, MD  Attending Physician:Millicent Blazejewski L, DO    Assessment/Plan:   Sepsis due to pneumo

## 2023-08-21 NOTE — Consults (Signed)
 Pulmonary Consult Note    Date Time: 08/21/23 8:33 PM  Patient Name: Chilton Si  Consulting Physician: Oneta Rack, DO          HPI:     Reason for Consult: Hypoxia     Ms. Fickel is a 72 year old female, nonsmoker, with a past medica

## 2023-08-21 NOTE — Progress Notes (Signed)
 Respiratory Therapy Patient Assessment    24/24  08/21/23 2:35 PM  RT: Basil Dess, RT      Admitting DX: Sepsis due to pneumonia [J18.9, A41.9]    Pulmonary History: None (childhood asthma (no current symptoms/treatments))    Other Pulm Hx:      Therap

## 2023-08-21 NOTE — ED Notes (Signed)
Call to resp, they will be down shortly to give pt breathing tx

## 2023-08-21 NOTE — Progress Notes (Signed)
 S: Patient admitted to [facility] for sepsis.     B: Patient lives in [place]. She is here visiting and she is staying at her son's house. She normally is ambulatory with a cane and sometimes a walker. She drives and is able to perform her own ADL's. She follows

## 2023-08-21 NOTE — ED Triage Notes (Signed)
 Pt ambulatory to triage with complaints of sob, productive cough, and headache. Pt states s/s have been persisting for a few days and has been taking tylenol. Patient states she currently lives in [place], and recently traveled to [place]. Pt denie

## 2023-08-21 NOTE — Plan of Care (Signed)
 Problem: Pain interferes with ability to perform ADL  Goal: Pain at adequate level as identified by patient  Outcome: Progressing  Flowsheets (Taken 08/21/2023 1630)  Pain at adequate level as identified by patient:   Identify patient comfort function goa

## 2023-08-22 LAB — LAB USE ONLY - URINE GRAY CULTURE HOLD TUBE

## 2023-08-22 LAB — LIPID PANEL
Cholesterol / HDL Ratio: 1.9 {index}
Cholesterol: 104 mg/dL (ref <=199)
HDL: 55 mg/dL
LDL Calculated: 41 mg/dL (ref 0–99)
Triglycerides: 41 mg/dL
VLDL Calculated: 8 mg/dL — ABNORMAL LOW (ref 10–40)

## 2023-08-22 LAB — CULTURE, URINE: Culture Urine: NO GROWTH

## 2023-08-22 LAB — LAB USE ONLY - CBC WITH DIFFERENTIAL
Absolute Basophils: 0.02 10*3/uL (ref 0.00–0.08)
Absolute Eosinophils: 0 10*3/uL (ref 0.00–0.44)
Absolute Immature Granulocytes: 0.02 10*3/uL (ref 0.00–0.07)
Absolute Lymphocytes: 0.45 10*3/uL (ref 0.42–3.22)
Absolute Monocytes: 0.16 10*3/uL — ABNORMAL LOW (ref 0.21–0.85)
Absolute Neutrophils: 5.15 10*3/uL (ref 1.10–6.33)
Absolute nRBC: 0 10*3/uL (ref <=0.00)
Basophils %: 0.3 %
Eosinophils %: 0 %
Hematocrit: 35.7 % (ref 34.7–43.7)
Hemoglobin: 11.5 g/dL (ref 11.4–14.8)
Immature Granulocytes %: 0.3 %
Lymphocytes %: 7.8 %
MCH: 32 pg (ref 25.1–33.5)
MCHC: 32.2 g/dL (ref 31.5–35.8)
MCV: 99.4 fL — ABNORMAL HIGH (ref 78.0–96.0)
MPV: 11.7 fL (ref 8.9–12.5)
Monocytes %: 2.8 %
Neutrophils %: 88.8 %
Platelet Count: 169 10*3/uL (ref 142–346)
Preliminary Absolute Neutrophil Count: 5.15 10*3/uL (ref 1.10–6.33)
RBC: 3.59 10*6/uL — ABNORMAL LOW (ref 3.90–5.10)
RDW: 13 % (ref 11–15)
WBC: 5.8 10*3/uL (ref 3.10–9.50)
nRBC %: 0 /100{WBCs} (ref <=0.0)

## 2023-08-22 LAB — RESTRICTED TEST**RESPIRATORY PATHOGEN PANEL WITH COVID-19, PCR
Adenovirus DNA: NOT DETECTED
Bordetella parapertussis DNA: NOT DETECTED
Bordetella pertussis DNA: NOT DETECTED
Chlamydophila pneumoniae DNA: NOT DETECTED
Coronavirus 229E RNA: NOT DETECTED
Coronavirus HKU1 RNA: NOT DETECTED
Coronavirus NL63 RNA: NOT DETECTED
Coronavirus OC43 RNA: NOT DETECTED
Human Metapneumovirus RNA: NOT DETECTED
Human Rhinovirus/Enterovirus RNA: DETECTED — AB
Influenza A RNA: NOT DETECTED
Influenza B RNA: NOT DETECTED
Mycoplasma pneumoniae DNA: NOT DETECTED
Parainfluenza Virus 1 RNA: NOT DETECTED
Parainfluenza Virus 2 RNA: NOT DETECTED
Parainfluenza Virus 3 RNA: NOT DETECTED
Parainfluenza Virus 4 RNA: NOT DETECTED
Respiratory Syncytial Virus RNA: NOT DETECTED
SARS-CoV-2 (COVID-19) RNA: NOT DETECTED

## 2023-08-22 LAB — PROCALCITONIN: Procalcitonin: 0.06 ng/mL (ref 0.00–0.10)

## 2023-08-22 LAB — ECG 12-LEAD
Atrial Rate: 85 {beats}/min
IHS MUSE NARRATIVE AND IMPRESSION: NORMAL
P Axis: 69 degrees
P-R Interval: 136 ms
Q-T Interval: 392 ms
QRS Duration: 132 ms
QTC Calculation (Bezet): 466 ms
R Axis: 39 degrees
T Axis: 42 degrees
Ventricular Rate: 85 {beats}/min

## 2023-08-22 LAB — COMPREHENSIVE METABOLIC PANEL
ALT: 33 U/L (ref <=55)
AST (SGOT): 30 U/L (ref <=41)
Albumin/Globulin Ratio: 1.2 (ref 0.9–2.2)
Albumin: 2.8 g/dL — ABNORMAL LOW (ref 3.5–5.0)
Alkaline Phosphatase: 98 U/L (ref 37–117)
Anion Gap: 6 (ref 5.0–15.0)
BUN: 8 mg/dL (ref 7–21)
Bilirubin, Total: 0.2 mg/dL (ref 0.2–1.2)
CO2: 27 meq/L (ref 17–29)
Calcium: 7.8 mg/dL — ABNORMAL LOW (ref 7.9–10.2)
Chloride: 108 meq/L (ref 99–111)
Creatinine: 0.5 mg/dL (ref 0.4–1.0)
GFR: >60 mL/min/{1.73_m2} (ref >=60.0)
Globulin: 2.4 g/dL (ref 2.0–3.6)
Glucose: 176 mg/dL — ABNORMAL HIGH (ref 70–100)
Potassium: 4.5 meq/L (ref 3.5–5.3)
Protein, Total: 5.2 g/dL — ABNORMAL LOW (ref 6.0–8.3)
Sodium: 141 meq/L (ref 135–145)

## 2023-08-22 LAB — URINE LEGIONELLA PNEUMOPHILA SEROGROUP 1 ANTIGEN: Urine Legionella pneumophila serogroup 1 Antigen: NEGATIVE

## 2023-08-22 LAB — URINE STREPTOCOCCUS PNEUMONIAE ANTIGEN: Urine Streptococcus pneumoniae Antigen: NEGATIVE

## 2023-08-22 LAB — THYROID STIMULATING HORMONE (TSH) WITH REFLEX TO FREE T4: TSH: 2 u[IU]/mL (ref 0.35–4.94)

## 2023-08-22 MED ORDER — FLUTICASONE FUROATE-VILANTEROL 100-25 MCG/ACT IN AEPB
1.0000 | INHALATION_SPRAY | Freq: Every morning | RESPIRATORY_TRACT | Status: DC
Start: 2023-08-22 — End: 2023-08-24
  Administered 2023-08-23 – 2023-08-24 (×2): 1 via RESPIRATORY_TRACT
  Filled 2023-08-22: qty 14

## 2023-08-22 MED ORDER — BENZOCAINE-MENTHOL MT LOZG (WRAP)
1.0000 | LOZENGE | OROMUCOSAL | Status: DC | PRN
Start: 2023-08-22 — End: 2023-08-24
  Administered 2023-08-22 – 2023-08-24 (×9): 1 via BUCCAL
  Filled 2023-08-22: qty 1

## 2023-08-22 MED ORDER — ACETYLCYSTEINE 10 % IN SOLN
3.0000 mL | Freq: Four times a day (QID) | RESPIRATORY_TRACT | Status: DC
Start: 2023-08-22 — End: 2023-08-22
  Administered 2023-08-22 (×2): 3 mL via RESPIRATORY_TRACT
  Filled 2023-08-22 (×2): qty 4

## 2023-08-22 NOTE — Progress Notes (Signed)
 Pulmonary Note    Date Time: 08/22/23 3:14 PM  Patient Name: Leslie Gross  Consulting Physician: Kavin Leech, MD          HPI:     Reason for Consult: Hypoxia     [name] is a 72 year old female, nonsmoker, with a past medical history of

## 2023-08-22 NOTE — Progress Notes (Addendum)
 RN Shift Note and Trio Rounds template  Date Time: 08/22/23 6:56 PM  Patient Name: Leslie Gross, Leslie Gross  Room No: A355/D322.A  Admit Date: 08/21/2023  Length of Stay: 1  Attending Physician: Kavin Leech, MD   Code Status:Full Code  Admit Status: Inpat

## 2023-08-22 NOTE — ED Provider Notes (Signed)
 Chief Complaint   Patient presents with    Shortness of Breath    Cough         History Provided by: patient     Additional History: Leslie Gross is a 72 y.o. female with a history of  has a past medical history of Anxiety and depression, Asthma, B

## 2023-08-22 NOTE — Progress Notes (Signed)
 RN Shift Note and Trio Rounds template  Date Time: 08/22/23 6:51 AM  Patient Name: Leslie Gross, Leslie Gross  Room No: E993/Z169.A  Admit Date: 08/21/2023  Length of Stay: 1  Attending Physician: Anselmo Pickler, MD   Code Status:Full Code  Admit Status: In

## 2023-08-22 NOTE — UM Notes (Signed)
 PATIENT NAME: Leslie Gross, Leslie Gross   DOB: Jan 25, 1951     Admit to Inpatient (Order #[id number]) on 08/21/23     Admit Diagnosis: Sepsis due to pneumonia [J18.9, A41.9]  Facility: [facility]    Reason for Admission: Charyl A

## 2023-08-22 NOTE — Plan of Care (Signed)
 Problem: Pain interferes with ability to perform ADL  Goal: Pain at adequate level as identified by patient  Outcome: Progressing  Flowsheets (Taken 08/22/2023 2300)  Pain at adequate level as identified by patient:   Identify patient comfort function goa

## 2023-08-22 NOTE — Plan of Care (Signed)
 Problem: Pain interferes with ability to perform ADL  Goal: Pain at adequate level as identified by patient  Outcome: Progressing     Problem: Side Effects from Pain Analgesia  Goal: Patient will experience minimal side effects of analgesic therapy  Outc

## 2023-08-22 NOTE — Progress Notes (Signed)
 [facility]  Progress Note  Patient Info:   Date/Time: 08/22/2023 / 5:50 PM   Admit Date:08/21/2023  Patient Name:Leslie Gross   ZOX:[id number]   PCP: Oneita Hurt None, MD  Attending Physician:Enez Monahan,

## 2023-08-22 NOTE — Plan of Care (Signed)
 Problem: Pain interferes with ability to perform ADL  Goal: Pain at adequate level as identified by patient  Outcome: Progressing     Problem: Compromised Activity/Mobility  Goal: Activity/Mobility Interventions  Outcome: Progressing     Problem: Inadequ

## 2023-08-22 NOTE — Progress Notes (Signed)
 ID CONSULTATION FOLLOW UP NOTE    Date Time: 08/22/23 3:41 PM  Patient Name: Leslie Gross  Requesting Physician: Kavin Leech, MD     Antimicrobials   Azithromycin 2  Ceftriaxone 2  Assessment/Plan   21F w/ PMH asthma (childhood), HLD, PMR.

## 2023-08-23 DIAGNOSIS — F419 Anxiety disorder, unspecified: Secondary | ICD-10-CM

## 2023-08-23 DIAGNOSIS — F32A Depression, unspecified: Secondary | ICD-10-CM

## 2023-08-23 DIAGNOSIS — K5190 Ulcerative colitis, unspecified, without complications: Secondary | ICD-10-CM

## 2023-08-23 DIAGNOSIS — I10 Essential (primary) hypertension: Secondary | ICD-10-CM

## 2023-08-23 DIAGNOSIS — E039 Hypothyroidism, unspecified: Secondary | ICD-10-CM

## 2023-08-23 DIAGNOSIS — J209 Acute bronchitis, unspecified: Secondary | ICD-10-CM

## 2023-08-23 LAB — LAB USE ONLY - CBC WITH DIFFERENTIAL
Absolute Basophils: 0.01 10*3/uL (ref 0.00–0.08)
Absolute Eosinophils: 0 10*3/uL (ref 0.00–0.44)
Absolute Immature Granulocytes: 0.05 10*3/uL (ref 0.00–0.07)
Absolute Lymphocytes: 1.08 10*3/uL (ref 0.42–3.22)
Absolute Monocytes: 0.68 10*3/uL (ref 0.21–0.85)
Absolute Neutrophils: 8.47 10*3/uL — ABNORMAL HIGH (ref 1.10–6.33)
Absolute nRBC: 0 10*3/uL (ref <=0.00)
Basophils %: 0.1 %
Eosinophils %: 0 %
Hematocrit: 37.3 % (ref 34.7–43.7)
Hemoglobin: 11.5 g/dL (ref 11.4–14.8)
Immature Granulocytes %: 0.5 %
Lymphocytes %: 10.5 %
MCH: 31 pg (ref 25.1–33.5)
MCHC: 30.8 g/dL — ABNORMAL LOW (ref 31.5–35.8)
MCV: 100.5 fL — ABNORMAL HIGH (ref 78.0–96.0)
MPV: 11.8 fL (ref 8.9–12.5)
Monocytes %: 6.6 %
Neutrophils %: 82.3 %
Platelet Count: 185 10*3/uL (ref 142–346)
Preliminary Absolute Neutrophil Count: 8.47 10*3/uL — ABNORMAL HIGH (ref 1.10–6.33)
RBC: 3.71 10*6/uL — ABNORMAL LOW (ref 3.90–5.10)
RDW: 13 % (ref 11–15)
WBC: 10.29 10*3/uL — ABNORMAL HIGH (ref 3.10–9.50)
nRBC %: 0 /100{WBCs} (ref <=0.0)

## 2023-08-23 MED ORDER — METHYLPREDNISOLONE SODIUM SUCC 40 MG IJ SOLR (WRAP)
20.0000 mg | Freq: Two times a day (BID) | INTRAMUSCULAR | Status: DC
Start: 2023-08-24 — End: 2023-08-24
  Administered 2023-08-24: 20 mg via INTRAVENOUS

## 2023-08-23 NOTE — Progress Notes (Addendum)
 RN Shift Note and Trio Rounds template  Date Time: 08/23/23 5:55 AM  Patient Name: Leslie Gross, Leslie Gross  Room No: J811/B147.A  Admit Date: 08/21/2023  Length of Stay: 2  Attending Physician: Kavin Leech, MD   Code Status:Full Code  Admit Status: Inpat

## 2023-08-23 NOTE — Progress Notes (Signed)
 ID CONSULTATION FOLLOW UP NOTE    Date Time: 08/23/23 10:55 AM  Patient Name: Leslie Gross  Requesting Physician: Kavin Leech, MD     Antimicrobials   Azithromycin 3  Ceftriaxone 3  Assessment/Plan   46F w/ PMH asthma (childhood), HLD, PMR.

## 2023-08-23 NOTE — Progress Notes (Signed)
 RN Shift Note and Trio Rounds template  Date Time: 08/23/23 6:39 PM  Patient Name: Leslie Gross, Leslie Gross  Room No: Z610/R604.A  Admit Date: 08/21/2023  Length of Stay: 2  Attending Physician: Kavin Leech, MD   Code Status:Full Code  Admit Status: Inpat

## 2023-08-23 NOTE — Plan of Care (Signed)
 Problem: Pain interferes with ability to perform ADL  Goal: Pain at adequate level as identified by patient  Outcome: Progressing  Flowsheets (Taken 08/22/2023 2300 by Maylon Cos, RN)  Pain at adequate level as identified by patient:   Ident

## 2023-08-23 NOTE — Progress Notes (Addendum)
 [facility]  Progress Note  Patient Info:   Date/Time: 08/23/2023 / 1:43 PM   Admit Date:08/21/2023  Patient Name:Leslie Gross   [id number]:[id number]   PCP: Oneita Hurt None, MD  Attending Physician:Sharece Fleischhacker,

## 2023-08-23 NOTE — Progress Notes (Signed)
 Pulmonary Note    Date Time: 08/23/23 6:42 PM  Patient Name: Leslie Gross  Consulting Physician: Kavin Leech, MD          HPI:     Reason for Consult: Hypoxia     Ms. Roads is a 72 year old female, nonsmoker, with a past medical history of

## 2023-08-23 NOTE — Plan of Care (Signed)
 Problem: Pain interferes with ability to perform ADL  Goal: Pain at adequate level as identified by patient  Outcome: Progressing     Problem: Side Effects from Pain Analgesia  Goal: Patient will experience minimal side effects of analgesic therapy  Outc

## 2023-08-24 ENCOUNTER — Encounter (INDEPENDENT_AMBULATORY_CARE_PROVIDER_SITE_OTHER): Payer: Self-pay

## 2023-08-24 LAB — LAB USE ONLY - CBC WITH DIFFERENTIAL
Absolute Basophils: 0.02 10*3/uL (ref 0.00–0.08)
Absolute Eosinophils: 0 10*3/uL (ref 0.00–0.44)
Absolute Immature Granulocytes: 0.07 10*3/uL (ref 0.00–0.07)
Absolute Lymphocytes: 1.88 10*3/uL (ref 0.42–3.22)
Absolute Monocytes: 0.59 10*3/uL (ref 0.21–0.85)
Absolute Neutrophils: 7.4 10*3/uL — ABNORMAL HIGH (ref 1.10–6.33)
Absolute nRBC: 0 10*3/uL (ref <=0.00)
Basophils %: 0.2 %
Eosinophils %: 0 %
Hematocrit: 38.1 % (ref 34.7–43.7)
Hemoglobin: 12 g/dL (ref 11.4–14.8)
Immature Granulocytes %: 0.7 %
Lymphocytes %: 18.9 %
MCH: 31.7 pg (ref 25.1–33.5)
MCHC: 31.5 g/dL (ref 31.5–35.8)
MCV: 100.8 fL — ABNORMAL HIGH (ref 78.0–96.0)
MPV: 11.5 fL (ref 8.9–12.5)
Monocytes %: 5.9 %
Neutrophils %: 74.3 %
Platelet Count: 192 10*3/uL (ref 142–346)
Preliminary Absolute Neutrophil Count: 7.4 10*3/uL — ABNORMAL HIGH (ref 1.10–6.33)
RBC: 3.78 10*6/uL — ABNORMAL LOW (ref 3.90–5.10)
RDW: 14 % (ref 11–15)
WBC: 9.96 10*3/uL — ABNORMAL HIGH (ref 3.10–9.50)
nRBC %: 0 /100{WBCs} (ref <=0.0)

## 2023-08-24 MED ORDER — FOLIC ACID 1 MG PO TABS
1.0000 mg | ORAL_TABLET | Freq: Every day | ORAL | 0 refills | Status: AC
Start: 2023-08-25 — End: 2023-09-24

## 2023-08-24 MED ORDER — RISAQUAD PO CAPS
1.0000 | ORAL_CAPSULE | Freq: Every day | ORAL | 0 refills | Status: AC
Start: 2023-08-25 — End: 2023-09-24

## 2023-08-24 MED ORDER — BENZONATATE 100 MG PO CAPS
100.0000 mg | ORAL_CAPSULE | Freq: Three times a day (TID) | ORAL | 0 refills | Status: AC | PRN
Start: 2023-08-24 — End: ?

## 2023-08-24 MED ORDER — GUAIFENESIN-CODEINE 100-10 MG/5ML PO SYRP
5.0000 mL | ORAL_SOLUTION | ORAL | 0 refills | Status: AC | PRN
Start: 2023-08-24 — End: ?

## 2023-08-24 MED ORDER — ALBUTEROL SULFATE HFA 108 (90 BASE) MCG/ACT IN AERS
2.0000 | INHALATION_SPRAY | RESPIRATORY_TRACT | 0 refills | Status: AC | PRN
Start: 2023-08-24 — End: ?

## 2023-08-24 MED ORDER — PREDNISONE 10 MG PO TABS
ORAL_TABLET | ORAL | 0 refills | Status: AC
Start: 2023-08-24 — End: 2023-08-30

## 2023-08-24 MED ORDER — DIAZEPAM 2 MG PO TABS
2.0000 mg | ORAL_TABLET | Freq: Two times a day (BID) | ORAL | 0 refills | Status: AC | PRN
Start: 2023-08-24 — End: ?

## 2023-08-24 MED ORDER — MESALAMINE ER 500 MG PO CPCR
1000.0000 mg | ORAL_CAPSULE | Freq: Four times a day (QID) | ORAL | 0 refills | Status: AC
Start: 2023-08-24 — End: 2023-09-23

## 2023-08-24 NOTE — Progress Notes (Signed)
 RN Shift Note and Trio Rounds template  Date Time: 08/24/23 5:23 AM  Patient Name: Leslie Gross, Leslie Gross  Room No: C789/F810.A  Admit Date: 08/21/2023  Length of Stay: 3  Attending Physician: Kavin Leech, MD   Code Status:Full Code  Admit Status: Inpat

## 2023-08-24 NOTE — Progress Notes (Signed)
Patient discharged to home with all personal belongings via private vehicle accompanied by son. Discharge instructions given to patient with copy. All questions answered.

## 2023-08-24 NOTE — Discharge Instr - AVS First Page (Addendum)
 Reason for your Hospital Admission:    Acute bronchitis due to human rhinovirus    Instructions for after your discharge:    Start prednisone 20 mg by mouth daily for 3 days followed by prednisone 10 mg by mouth daily for 3 days  Resume all medications as

## 2023-08-24 NOTE — Plan of Care (Signed)
 Problem: Pain interferes with ability to perform ADL  Goal: Pain at adequate level as identified by patient  Outcome: Progressing

## 2023-08-24 NOTE — Discharge Summary (Signed)
 [facility] HOSPITALIST Discharge Summary  Patient Info:   Date/Time: 08/24/2023 / 1:34 PM   Admit Date:08/21/2023  Patient Name:Leslie Gross   DDU:[id number]   PCP: Marisa Sprinkles, MD  Attending Physician:Latifa Noble, Thomasene Lot, MD  Date of admission:   08/21/2023  Typ

## 2023-08-24 NOTE — Progress Notes (Signed)
 [employer] for [employer] (previously Psychologist, counselling):     Received a referral entered by Clinton Sawyer, MSW, to schedule a follow up appointment with the [facility] for [employer].  Appointment scheduled for T

## 2023-08-26 ENCOUNTER — Ambulatory Visit (INDEPENDENT_AMBULATORY_CARE_PROVIDER_SITE_OTHER): Payer: Medicare Other

## 2023-08-26 ENCOUNTER — Encounter (INDEPENDENT_AMBULATORY_CARE_PROVIDER_SITE_OTHER): Payer: Self-pay

## 2023-08-26 LAB — CULTURE BLOOD AEROBIC AND ANAEROBIC: Culture Blood: NO GROWTH

## 2023-08-26 NOTE — Progress Notes (Addendum)
 Opened in error
